# Patient Record
Sex: Male | Born: 1955
Health system: Southern US, Community
[De-identification: ages and names within clinical notes are randomized; demographics above are authoritative.]

## PROBLEM LIST (undated history)

## (undated) DIAGNOSIS — M1990 Unspecified osteoarthritis, unspecified site: Secondary | ICD-10-CM

## (undated) DIAGNOSIS — I471 Supraventricular tachycardia, unspecified: Secondary | ICD-10-CM

## (undated) DIAGNOSIS — J61 Pneumoconiosis due to asbestos and other mineral fibers: Secondary | ICD-10-CM

## (undated) DIAGNOSIS — R55 Syncope and collapse: Secondary | ICD-10-CM

## (undated) DIAGNOSIS — R05 Cough: Secondary | ICD-10-CM

## (undated) DIAGNOSIS — R1310 Dysphagia, unspecified: Secondary | ICD-10-CM

## (undated) DIAGNOSIS — H269 Unspecified cataract: Secondary | ICD-10-CM

## (undated) DIAGNOSIS — N200 Calculus of kidney: Secondary | ICD-10-CM

## (undated) DIAGNOSIS — M25569 Pain in unspecified knee: Secondary | ICD-10-CM

## (undated) DIAGNOSIS — I10 Essential (primary) hypertension: Secondary | ICD-10-CM

## (undated) DIAGNOSIS — Z86718 Personal history of other venous thrombosis and embolism: Secondary | ICD-10-CM

## (undated) HISTORY — PX: RETINAL DETACHMENT SURGERY: SHX105

## (undated) HISTORY — PX: VARICOSE VEIN SURGERY: SHX832

## (undated) HISTORY — PX: EYE SURGERY: SHX253

## (undated) HISTORY — PX: NECK SURGERY: SHX720

## (undated) HISTORY — DX: Essential (primary) hypertension: I10

## (undated) HISTORY — PX: COLONOSCOPY: SHX174

## (undated) HISTORY — PX: WISDOM TOOTH EXTRACTION: SHX21

## (undated) HISTORY — PX: HERNIA REPAIR: SHX51

## (undated) HISTORY — DX: Pain in unspecified knee: M25.569

## (undated) HISTORY — PX: UPPER GASTROINTESTINAL ENDOSCOPY: SHX188

## (undated) HISTORY — DX: Unspecified cataract: H26.9

## (undated) HISTORY — PX: CATARACT EXTRACTION: SUR2

---

## 1999-02-02 ENCOUNTER — Ambulatory Visit (HOSPITAL_COMMUNITY): Admission: RE | Admit: 1999-02-02 | Discharge: 1999-02-02 | Payer: Self-pay | Admitting: Internal Medicine

## 2003-02-07 ENCOUNTER — Encounter: Payer: Self-pay | Admitting: Unknown Physician Specialty

## 2003-02-07 ENCOUNTER — Ambulatory Visit (HOSPITAL_COMMUNITY): Admission: RE | Admit: 2003-02-07 | Discharge: 2003-02-07 | Payer: Self-pay | Admitting: Unknown Physician Specialty

## 2003-02-10 ENCOUNTER — Ambulatory Visit (HOSPITAL_COMMUNITY): Admission: RE | Admit: 2003-02-10 | Discharge: 2003-02-10 | Payer: Self-pay | Admitting: Unknown Physician Specialty

## 2003-02-10 ENCOUNTER — Encounter: Payer: Self-pay | Admitting: Unknown Physician Specialty

## 2003-11-17 ENCOUNTER — Ambulatory Visit (HOSPITAL_COMMUNITY): Admission: RE | Admit: 2003-11-17 | Discharge: 2003-11-17 | Payer: Self-pay | Admitting: Unknown Physician Specialty

## 2003-12-21 ENCOUNTER — Ambulatory Visit (HOSPITAL_COMMUNITY): Admission: RE | Admit: 2003-12-21 | Discharge: 2003-12-21 | Payer: Self-pay | Admitting: Unknown Physician Specialty

## 2004-01-02 ENCOUNTER — Encounter: Admission: RE | Admit: 2004-01-02 | Discharge: 2004-02-02 | Payer: Self-pay | Admitting: Unknown Physician Specialty

## 2008-06-22 ENCOUNTER — Ambulatory Visit: Payer: Self-pay | Admitting: Cardiology

## 2008-06-22 ENCOUNTER — Observation Stay (HOSPITAL_COMMUNITY): Admission: EM | Admit: 2008-06-22 | Discharge: 2008-06-23 | Payer: Self-pay | Admitting: Emergency Medicine

## 2008-06-23 ENCOUNTER — Ambulatory Visit: Payer: Self-pay

## 2008-07-12 ENCOUNTER — Ambulatory Visit: Payer: Self-pay | Admitting: Cardiology

## 2008-09-23 HISTORY — PX: WRIST FRACTURE SURGERY: SHX121

## 2009-03-14 ENCOUNTER — Emergency Department (HOSPITAL_COMMUNITY): Admission: EM | Admit: 2009-03-14 | Discharge: 2009-03-14 | Payer: Self-pay | Admitting: Emergency Medicine

## 2010-07-20 ENCOUNTER — Encounter (INDEPENDENT_AMBULATORY_CARE_PROVIDER_SITE_OTHER): Payer: Self-pay | Admitting: *Deleted

## 2010-08-15 ENCOUNTER — Encounter (INDEPENDENT_AMBULATORY_CARE_PROVIDER_SITE_OTHER): Payer: Self-pay | Admitting: *Deleted

## 2010-08-20 ENCOUNTER — Ambulatory Visit: Payer: Self-pay | Admitting: Internal Medicine

## 2010-09-06 ENCOUNTER — Ambulatory Visit: Payer: Self-pay | Admitting: Internal Medicine

## 2010-09-15 ENCOUNTER — Encounter: Payer: Self-pay | Admitting: Internal Medicine

## 2010-10-23 NOTE — Miscellaneous (Signed)
Summary: previsit prep/rm  Clinical Lists Changes  Medications: Added new medication of MOVIPREP 100 GM  SOLR (PEG-KCL-NACL-NASULF-NA ASC-C) As per prep instructions. - Signed Rx of MOVIPREP 100 GM  SOLR (PEG-KCL-NACL-NASULF-NA ASC-C) As per prep instructions.;  #1 x 0;  Signed;  Entered by: Sherren Kerns RN;  Authorized by: Iva Boop MD, [facility];  Method used: [facility] [phone]*, [address] [place], [place], [place]  [address], Ph: [phone] or [phone], Fax: [phone] Allergies: Added new allergy or adverse reaction of SULFA Observations: Added new observation of ALLERGY REV: Done (08/20/2010 13:27) Added new observation of NKA: F (08/20/2010 13:27)    Prescriptions: MOVIPREP 100 GM  SOLR (PEG-KCL-NACL-NASULF-NA ASC-C) As per prep instructions.  #1 x 0   Entered by:   Sherren Kerns RN   Authorized by:   Iva Boop MD, [facility]   Signed by:   Sherren Kerns RN on 08/20/2010   Method used:   [facility]ronically to        [facility] [phone]* (retail)       [address]       [place], [place]  [address]       Ph: [phone] or [phone]       Fax: [phone]   RxID:   [id number]

## 2010-10-23 NOTE — Letter (Signed)
Summary: [facility] Instructions  [facility]  [address] [place], [place] [address]   Phone: [phone]  Fax: [phone]       Jeffrey [name]    05-11-1956    MRN: [id number]        Procedure Day /Date: 09/06/10   Thursday     Arrival Time:  1:00pm      Procedure Time:  2:00pm     Location of Procedure:                    _x _  [facility] (4th Floor)                        PREPARATION FOR COLONOSCOPY WITH MOVIPREP   Starting 5 days prior to your procedure _112/10/11 _ do not eat nuts, seeds, popcorn, corn, beans, peas,  salads, or any raw vegetables.  Do not take any fiber supplements (e.g. Metamucil, Citrucel, and Benefiber).  THE DAY BEFORE YOUR PROCEDURE         DATE:   09/05/10  DAY:  Wednesday  1.  Drink clear liquids the entire day-NO SOLID FOOD  2.  Do not drink anything colored red or purple.  Avoid juices with pulp.  No orange juice.  3.  Drink at least 64 oz. (8 glasses) of fluid/clear liquids during the day to prevent dehydration and help the prep work efficiently.  CLEAR LIQUIDS INCLUDE: Water Jello Ice Popsicles Tea (sugar ok, no milk/cream) Powdered fruit flavored drinks Coffee (sugar ok, no milk/cream) Gatorade Juice: apple, white grape, white cranberry  Lemonade Clear bullion, consomm, broth Carbonated beverages (any kind) Strained chicken noodle soup Hard Candy                             4.  In the morning, mix first dose of MoviPrep solution:    Empty 1 Pouch A and 1 Pouch B into the disposable container    Add lukewarm drinking water to the top line of the container. Mix to dissolve    Refrigerate (mixed solution should be used within 24 hrs)  5.  Begin drinking the prep at 5:00 p.m. The MoviPrep container is divided by 4 marks.   Every 15 minutes drink the solution down to the next mark (approximately 8 oz) until the full liter is complete.   6.  Follow completed prep with 16 oz of clear liquid of your  choice (Nothing red or purple).  Continue to drink clear liquids until bedtime.  7.  Before going to bed, mix second dose of MoviPrep solution:    Empty 1 Pouch A and 1 Pouch B into the disposable container    Add lukewarm drinking water to the top line of the container. Mix to dissolve    Refrigerate  THE DAY OF YOUR PROCEDURE      DATE:   09/06/10  DAY:   Thursday  Beginning at  9:00 a.m. (5 hours before procedure):         1. Every 15 minutes, drink the solution down to the next mark (approx 8 oz) until the full liter is complete.  2. Follow completed prep with 16 oz. of clear liquid of your choice.    3. You may drink clear liquids until _ 12 noon _ (2 HOURS BEFORE PROCEDURE).   MEDICATION INSTRUCTIONS  Unless otherwise instructed, you should take regular prescription medications  with a small sip of water   as early as possible the morning of your procedure.           OTHER INSTRUCTIONS  You will need a responsible adult at least 55 years of age to accompany you and drive you home.   This person must remain in the waiting room during your procedure.  Wear loose fitting clothing that is easily removed.  Leave jewelry and other valuables at home.  However, you may wish to bring a book to read or  an iPod/MP3 player to listen to music as you wait for your procedure to start.  Remove all body piercing jewelry and leave at home.  Total time from sign-in until discharge is approximately 2-3 hours.  You should go home directly after your procedure and rest.  You can resume normal activities the  day after your procedure.  The day of your procedure you should not:   Drive   Make legal decisions   Operate machinery   Drink alcohol   Return to work  You will receive specific instructions about eating, activities and medications before you leave.    The above instructions have been reviewed and explained to me by   Sherren Kerns RN  August 20, 2010 2:13  PM  I fully understand and can verbalize these instructions _____________________________ Date _________

## 2010-10-23 NOTE — Letter (Signed)
Summary: Pre Visit Letter Revised  [facility]  [address] [place], [place] [address]   Phone: [phone]  Fax: [phone]        07/20/2010 MRN: [id number] Jeffrey [name] [address] [place], [place]  [address]             Procedure Date:  08/30/2010   Welcome to the Gastroenterology Division at [facility].    You are scheduled to see a nurse for your pre-procedure visit on 08/15/2010 at 8:00AM on the 3rd floor at [facility], [address]  We ask that you try to arrive at our office 15 minutes prior to your appointment time to allow for check-in.  Please take a minute to review the attached form.  If you answer "Yes" to one or more of the questions on the first page, we ask that you call the person listed at your earliest opportunity.  If you answer "No" to all of the questions, please complete the rest of the form and bring it to your appointment.    Your nurse visit will consist of discussing your medical and surgical history, your immediate family medical history, and your medications.   If you are unable to list all of your medications on the form, please bring the medication bottles to your appointment and we will list them.  We will need to be aware of both prescribed and over the counter drugs.  We will need to know exact dosage information as well.    Please be prepared to read and sign documents such as consent forms, a financial agreement, and acknowledgement forms.  If necessary, and with your consent, a friend or relative is welcome to sit-in on the nurse visit with you.  Please bring your insurance card so that we may make a copy of it.  If your insurance requires a referral to see a specialist, please bring your referral form from your primary care physician.  No co-pay is required for this nurse visit.     If you cannot keep your appointment, please call [phone] to cancel or reschedule prior to your appointment date.  This allows  [place] the opportunity to schedule an appointment for another patient in need of care.    Thank you for choosing [facility] for your medical needs.  We appreciate the opportunity to care for you.  Please visit [place] at our website  to learn more about our practice.  Sincerely, The Gastroenterology Division

## 2010-10-25 NOTE — Letter (Signed)
Summary: Patient Notice- Polyp Results  [facility]  [address] [place], [place] [address]   Phone: [phone]  Fax: [phone]        September 15, 2010 MRN: [id number]    ROCKEY GUARINO [address] [place], [place]  [address]    Dear Mr. Normington,  The polyp removed from your colon was adenomatous. This means that it was pre-cancerous or that  it had the potential to change into cancer over time.  I recommend that you have a repeat colonoscopy in 5 years to determine if you have developed any new polyps over time and screen for colorectal cancer. If you develop any new rectal bleeding, abdominal pain or significant bowel habit changes, please contact us before then.  Please call us if you are having persistent problems or have questions about your condition that have not been fully answered at this time.   Sincerely,  Iva Boop MD, [facility]  This letter has been electronically signed by your physician.  Appended Document: Patient Notice- Polyp Results Letter mailed

## 2010-10-25 NOTE — Procedures (Signed)
Summary: Colonoscopy  Patient: Jeffrey [name] Note: All result statuses are Final unless otherwise noted.  Tests: (1) Colonoscopy (COL)   COL Colonoscopy           DONE     [facility]     [address]     [place], [place]  [address]           COLONOSCOPY PROCEDURE REPORT           PATIENT:  Jeffrey [name], Jeffrey [name]  MR#:  [id number]     BIRTHDATE:  1956/09/14, 54 yrs. old  GENDER:  male     ENDOSCOPIST:  Iva Boop, MD, [facility]     REF. BY:  Rudi Heap, M.D.     PROCEDURE DATE:  09/06/2010     PROCEDURE:  Colonoscopy with snare polypectomy     ASA CLASS:  Class I     INDICATIONS:  Routine Risk Screening     MEDICATIONS:   Fentanyl 75 mcg IV, Versed 7 mg IV           DESCRIPTION OF PROCEDURE:   After the risks benefits and     alternatives of the procedure were thoroughly explained, informed     consent was obtained.  Digital rectal exam was performed and     revealed no abnormalities and normal prostate.   The LB 180AL     [id number] endoscope was introduced through the anus and advanced to     the cecum, which was identified by both the appendix and ileocecal     valve, without limitations.  The quality of the prep was     excellent, using MoviPrep.  The instrument was then slowly     withdrawn as the colon was fully examined. Insertion: 2:18 minutes     Withdrawal: 10:07 minutes     <<PROCEDUREIMAGES>>           FINDINGS:  A diminutive polyp was found in the ascending colon. It     was 4 mm in size. Polyp was snared without cautery. Retrieval was     successful. snare polyp  This was otherwise a normal examination     of the colon.   Retroflexed views in the right colon and  rectum     revealed no abnormalities.    The scope was then withdrawn from     the patient and the procedure completed.           COMPLICATIONS:  None     ENDOSCOPIC IMPRESSION:     1) 4 mm diminutive polyp in the ascending colon - removed     2) Otherwise normal examination with excellent prep          REPEAT EXAM:  In for Colonoscopy, pending biopsy results.           Iva Boop, MD, [facility]           CC:  Rudi Heap, MD and The Patient           n.     eSIGNED:   Iva Boop at 09/06/2010 02:05 PM           Cecil Cranker, [id number]  Note: An exclamation mark (!) indicates a result that was not dispersed into the flowsheet. Document Creation Date: 09/06/2010 2:06 PM _______________________________________________________________________  (1) Order result status: Final Collection or observation date-time: 09/06/2010 13:58 Requested date-time:  Receipt date-time:  Reported date-time:  Referring Physician:   Ordering Physician: Stan Head (  [id number]) Specimen Source:  Source: Launa Grill Order Number: [id number] Lab site:   Appended Document: Colonoscopy      Colonoscopy  Procedure date:  09/06/2010  Findings:          1) 4 mm diminutive polyp in the ascending colon - removed ADENOMA     2) Otherwise normal examination with excellent prep  Comments:      Repeat colonoscopy in 5 years.   Procedures Next Due Date:    Colonoscopy: 08/2015   Appended Document: Colonoscopy     Procedures Next Due Date:    Colonoscopy: 08/2015

## 2011-02-05 NOTE — Assessment & Plan Note (Signed)
[facility]                            CARDIOLOGY OFFICE NOTE   NAME:[name], Jeffrey [name]                         MRN:          [id number]  DATE:07/12/2008                            DOB:          1955/12/16    Jeffrey [name] returns today for followup.  He was admitted on June 22, 2008, with chest pain.   He ruled out for myocardial infarction.  His lipid panel showed a mixed  hyperlipidemia.  His hemoglobin A1c was 5.6.   He was discharged on Lopressor 25 b.i.d., Lipitor 20 mg q.h.s., and  enteric-coated aspirin 81 mg a day.  He is not taking his Lipitor or his  Lopressor.  He wants to try to lose weight and do this on his own.   He had a stress Myoview as an outpatient here at the office on June 23, 2008.  This showed excellent exercise tolerance.  No ST-segment  changes and normal perfusion.  His EF was 65%.   His exam today is unchanged from his admission H&P.   Jeffrey [name] probably has nonobstructive coronary artery disease.  He has  several cardiac risk factors.  Both his blood pressure and  hyperlipidemia need to be addressed.  He would like to try this by  losing weight and with changing his diet.  I have told him I doubt this  is going to be successful.  If it is not, we will be happy to see him  back and represcribe medications for both.  He can also follow with Dr.  Rudi Heap and his group at [facility].  Otherwise, we will see him back p.r.n.   I have filled out a form for [employer] for him to return to work.     Thomas C. Daleen Squibb, MD, [facility]  Electronically Signed    TCW/MedQ  DD: 07/12/2008  DT: 07/13/2008  Job #: [id number]   cc:   Ernestina Penna, M.D.

## 2011-02-05 NOTE — H&P (Signed)
NAMEANDRAY, Jeffrey [name]                  ACCOUNT NO.:  [id number]   MEDICAL RECORD NO.:  [id number]          PATIENT TYPE:  INP   LOCATION:  4709                         FACILITY:  [facility]   PHYSICIAN:  Jesse Sans. Wall, MD, FACCDATE OF BIRTH:  [birth year]   DATE OF ADMISSION:  06/22/2008  DATE OF DISCHARGE:                              HISTORY & PHYSICAL   PRIMARY CARE PHYSICIAN:  Ernestina Penna, MD in [place].   CARDIOLOGIST:  Jesse Sans. Wall, MD, [facility]   SUMMERY OF HISTORY:  Mr. Valadez is a 55 year old white male who presents  to [facility] Emergency Room complaining of chest discomfort.  He  describes at least a 3-to 4-week history of anterior dull chest  discomfort, that is not radiated, would last seconds at a time and occur  3-4 times a day.  This would not be associated with shortness of breath,  nausea, vomiting, or diaphoresis.  This discomfort was usually a 3 on a  scale of 0-10 and would occur any time, however, he would not seem to  notice while playing volleyball.  Over the preceding week, he has  noticed increased frequency to the point that it is occurring every 5  minutes and lasting a few seconds.  He might have several 100 episodes a  day.  Occasionally, it would now radiate to the left shoulder or left  arm.  Again, he gives at a 3 on a scale of 0-10.  He has not had any  other associated symptoms.  He denies recent accidents, injuries, or  travel.  It is not pleuritic, does not change with twisting, turning, or  movement and denies prior occurrences.  He specifically states with  playing volleyball in the last couple weeks, he has not noticed the  discomfort nor has he had to stop and rest for any reason.   PAST MEDICAL HISTORY:  No known drug allergies.   MEDICATIONS:  Prior to admission include over-the-counter Claritin daily  since Friday and p.r.n. Tylenol.   He has a history of seasonal allergies and asbestosis that was diagnosed  through a work program, although he  has not had any complications from  this.  He also has a possible history of hyperlipidemia, but has not  checked in several years.  He has had C5 through C7 disk surgery,  bilateral inguinal hernia repair, left thumb surgery, multiple rib  fractures in L4, for these he did not seek medical treatment for.  He  specifically denies myocardial infarction, CVA, diabetes, hypertension,  bleeding dyscrasias, thyroid dysfunction, and renal disorder.   He resides in [place] with his wife.  He has 2 children.  No  grandchildren.  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  He has never smoked.  He might have 2 beers per month.  Denies  drug use.  Denies specific exercise, diet, or herbal medications.   FAMILY HISTORY:  Mother is alive at age 68 with diverticulitis and  history of gallbladder problems for which she had surgery.  Father is 63  with history of  hypertension and stroke.  He has 2 brothers and 1  sister, alive and well.   REVIEW OF SYSTEMS:  In addition to the above, is notable for occasional  headaches, sinus congestion contacts, positive snoring, has not been  evaluated for sleep apnea, nocturia.  All other systems are  unremarkable.   PHYSICAL EXAMINATION:  GENERAL:  Well-nourished, well-developed,  pleasant white male in no apparent distress.  VITAL SIGNS:  Temperature is 97.6, blood pressure 137/98, pulse 81, and  respirations 20, and 97% sats on room air.  HEENT:  Unremarkable.  NECK:  Supple without thyromegaly, adenopathy, JVD, or carotid bruits.  CHEST:  Symmetrical to excursion.  Lung sounds are clear to  auscultation.  HEART:  PMI is nondisplaced.  Regular rate and rhythm.  I do not  appreciate any murmurs, rubs, clicks, or gallops.  All pulses are  symmetrical and intact without bruits.  SKIN:  Integument is intact.  ABDOMEN:  Slightly rounded.  Bowel sounds present without organomegaly,  masses, or tenderness.  EXTREMITIES:  Negative for  cyanosis, clubbing, or edema.  MUSCULOSKELETAL:  Unremarkable.  It is noted that he does not have any  reproducible discomfort with palpation or with full range of motion on  his upper extremities.  NEURO:  Unremarkable.   Chest X-ray:  No active disease.  EKG showed normal sinus rhythm, normal  axis, nonspecific ST-T wave changes.  No old EKGs available for  comparison.  H&H is 15.3 and 44.5, normal indices, platelets 205, and  WBC is 6.2.  Sodium 139, potassium 4.9, BUN 14, creatinine 1.05, and  glucose 82.  Point-of-care marker negative x1.   IMPRESSION:  1. Atypical chest discomfort.  Given his history, negative point-of-      care and EKGs.  This does not seem to be a cardiac origin.  2. Hypertension.  History is noted per past medical history.   DISPOSITION:  Dr. Daleen Squibb reviewed the patient's history, spoke with and  examined the patient.  He also spend time speaking with his wife, Jeffrey [name],  who is a Engineer, civil (consulting) here at [facility].  We will admit him overnight  for observation.  If he is ruled out for myocardial infarction, we have  arranged an outpatient stress Myoview in our office at 12 o'clock.  He  also will have a followup appointment with Dr. Daleen Squibb on July 12, 2008,  at 1:45 p.m.  We will perform our usual labs as well as check a fasting  lipid.      Joellyn Rued, PA-C      Jesse Sans. Daleen Squibb, MD, [facility]  Electronically Signed    EW/MEDQ  D:  06/22/2008  T:  06/23/2008  Job:  [id number]   cc:   Ernestina Penna, M.D.  Thomas C. Wall, MD, [facility]

## 2011-02-05 NOTE — Discharge Summary (Signed)
NAMEZACKORY, Jeffrey [name]                  ACCOUNT NO.:  [id number]   MEDICAL RECORD NO.:  [id number]          PATIENT TYPE:  INP   LOCATION:  4709                         FACILITY:  [facility]   PHYSICIAN:  Jeffrey Sans. Wall, MD, FACCDATE OF BIRTH:  [birth year]   DATE OF ADMISSION:  06/22/2008  DATE OF DISCHARGE:  06/23/2008                               DISCHARGE SUMMARY   PRIMARY CARDIOLOGIST:  Jeffrey Fus C. Wall, MD, [facility]   PRIMARY CARE PHYSICIAN:  Dr. Christell Constant in [place].   FINAL DISCHARGE DIAGNOSES:  1. Atypical chest pain.  2. Hypertension.  3. History of asbestosis exposure.  4. Hyperlipidemia.   PROCEDURES PERFORMED DURING HOSPITALIZATION:  None.   HOSPITAL COURSE:  This is a 55 year old male patient with complaints of  3- to 4-week history of anterior chest dull pain without radiation  lasting seconds at a time, 3-4 times a day with no associated symptoms.  The patient has noticed increased frequency every 5 minutes with  occasional radiation to the left shoulder and arm.  The patient had no  associated shortness of breath, PND, or orthopnea.  As a result of this,  the patient came into the emergency room for evaluation.  The patient's  cardiac enzymes were cycled and found to be negative.  EKG revealed  normal sinus rhythm.  It was noted that the patient did have elevated  LDL of 135 and total cholesterol of 192.  The patient had no recurrence  of chest discomfort.  During hospitalization, he was placed on Lipitor  20 mg and aspirin daily.  The patient was seen and examined the  following day by Dr. Valera Castle and found to be stable for discharge.  The patient will return home on Lopressor 25 mg twice a day and Lipitor  40 mg nightly.  However, the Lopressor will be held on day of discharge  as he has been scheduled for an outpatient stress Myoview at the [facility]  Cardiology Office at 12 noon with a followup with Dr. Daleen [name] thereafter.   DISCHARGE LABORATORIES:  Sodium 139, potassium 4.9,  chloride 108, CO2 of  26, BUN 14, creatinine 1.05, and glucose 82.  Hemoglobin 15.3,  hematocrit 44.5, white blood cells 6.2, and platelets 207.  Cardiac  enzymes are found to be negative.  TSH normal.  Hemoglobin A1c 5.6.  Total cholesterol 192, LDL 135, and HDL 32.   DISCHARGE VITAL SIGNS:  Blood pressure 120/75, heart rate 62,  respirations 20, and O2 sat 96% on room air.   EKG revealing normal sinus rhythm.   DISCHARGE MEDICATIONS:  1. Aspirin 81 mg daily.  2. Lipitor 20 mg at bedtime.  3. Lopressor 25 mg b.i.d.   ALLERGIES:  No known drug allergies.   FOLLOWUP PLANS AND APPOINTMENT:  1. The patient is scheduled for stress Myoview in our office today,      June 23, 2008.  2. The patient will follow up with Dr. Valera Castle on July 12, 2008, at 1:25 p.m.  3. The patient will follow up with his primary care physician for  continued medical management.  4. The patient has been provided prescriptions for Lipitor and      Lopressor.   Time spent with the patient to include physician time 35 minutes.      Bettey Mare. Lyman Bishop, NP      Jeffrey Sans. Daleen Squibb, MD, [facility]  Electronically Signed    KML/MEDQ  D:  06/23/2008  T:  06/23/2008  Job:  [id number]   cc:   Ernestina Penna, M.D.

## 2011-06-24 LAB — DIFFERENTIAL
Basophils Absolute: 0.1
Basophils Relative: 1
Eosinophils Relative: 3
Lymphocytes Relative: 26

## 2011-06-24 LAB — BASIC METABOLIC PANEL
BUN: 14
Calcium: 9.2
GFR calc non Af Amer: >60
Glucose, Bld: 82
Potassium: 4.9

## 2011-06-24 LAB — CBC
HCT: 44.5
MCHC: 34.5
Platelets: 205
RDW: 12.7

## 2011-06-24 LAB — LIPID PANEL
Cholesterol: 192
LDL Cholesterol: 135 — ABNORMAL HIGH
Triglycerides: 123

## 2011-06-24 LAB — POCT CARDIAC MARKERS: Myoglobin, poc: 87.9

## 2011-06-24 LAB — PROTIME-INR
INR: 1
INR: 1
Prothrombin Time: 13.6

## 2011-06-24 LAB — HEMOGLOBIN A1C: Mean Plasma Glucose: 114

## 2011-06-24 LAB — APTT
aPTT: 29
aPTT: 30

## 2011-06-24 LAB — CK TOTAL AND CKMB (NOT AT ARMC): Relative Index: INVALID

## 2011-06-24 LAB — TROPONIN I: Troponin I: 0.01

## 2011-06-25 LAB — COMPREHENSIVE METABOLIC PANEL
AST: 22
Albumin: 3.4 — ABNORMAL LOW
Alkaline Phosphatase: 57
Chloride: 108
GFR calc Af Amer: >60
Potassium: 4.1
Total Bilirubin: 0.7
Total Protein: 5.7 — ABNORMAL LOW

## 2011-06-25 LAB — CARDIAC PANEL(CRET KIN+CKTOT+MB+TROPI)
CK, MB: 1.5
Total CK: 76

## 2011-10-02 ENCOUNTER — Emergency Department (HOSPITAL_COMMUNITY): Payer: 59

## 2011-10-02 ENCOUNTER — Emergency Department (HOSPITAL_COMMUNITY)
Admission: EM | Admit: 2011-10-02 | Discharge: 2011-10-02 | Disposition: A | Discharge status: Home / Self Care | Payer: 59 | Attending: Emergency Medicine | Admitting: Emergency Medicine

## 2011-10-02 ENCOUNTER — Encounter (HOSPITAL_COMMUNITY): Payer: Self-pay | Admitting: Emergency Medicine

## 2011-10-02 DIAGNOSIS — R109 Unspecified abdominal pain: Secondary | ICD-10-CM | POA: Insufficient documentation

## 2011-10-02 DIAGNOSIS — K869 Disease of pancreas, unspecified: Secondary | ICD-10-CM | POA: Insufficient documentation

## 2011-10-02 DIAGNOSIS — R110 Nausea: Secondary | ICD-10-CM | POA: Insufficient documentation

## 2011-10-02 DIAGNOSIS — N200 Calculus of kidney: Secondary | ICD-10-CM | POA: Insufficient documentation

## 2011-10-02 LAB — POCT I-STAT, CHEM 8
Hemoglobin: 13.6 g/dL (ref 13.0–17.0)
Sodium: 142 mEq/L (ref 135–145)
TCO2: 24 mmol/L (ref 0–100)

## 2011-10-02 LAB — URINALYSIS, ROUTINE W REFLEX MICROSCOPIC
Bilirubin Urine: NEGATIVE
Glucose, UA: NEGATIVE mg/dL
pH: 5.5 (ref 5.0–8.0)

## 2011-10-02 LAB — URINE CULTURE
Colony Count: NO GROWTH
Culture  Setup Time: 201301100118

## 2011-10-02 LAB — URINE MICROSCOPIC-ADD ON

## 2011-10-02 MED ORDER — PERCOCET 5-325 MG PO TABS: 1.0000 | ORAL_TABLET | Freq: Four times a day (QID) | ORAL | Status: AC | PRN

## 2011-10-02 NOTE — ED Provider Notes (Signed)
History     CSN: [id number]  Arrival date & time 10/02/11  1125   First MD Initiated Contact with Patient 10/02/11 1209      Chief Complaint  Patient presents with  . Flank Pain    (Consider location/radiation/quality/duration/timing/severity/associated sxs/prior treatment) HPI Comments: Patient presented to the ED with chief complaint of severe right flank pain associated with nausea.  Patient denies history of back pain or kidney stones.  Patient is a 56 y.o. male presenting with flank pain. The history is provided by the patient.  Flank Pain This is a new problem. The current episode started today. The problem occurs constantly. The problem has been resolved (with pain medication ). Associated symptoms include nausea. Pertinent negatives include no abdominal pain, anorexia, arthralgias, change in bowel habit, chest pain, chills, congestion, coughing, diaphoresis, fatigue, fever, headaches, joint swelling, myalgias, neck pain, numbness, rash, sore throat, swollen glands, urinary symptoms, vertigo, visual change, vomiting or weakness. Exacerbated by: any movement or vibration. Treatments tried: morphine via EMS, 8 IV. The treatment provided significant relief.    History reviewed. No pertinent past medical history.  History reviewed. No pertinent past surgical history.  No family history on file.  History  Substance Use Topics  . Smoking status: Never Smoker   . Smokeless tobacco: Not on file  . Alcohol Use: No      Review of Systems  Constitutional: Negative for fever, chills, diaphoresis and fatigue.  HENT: Negative for ear pain, congestion, sore throat, neck pain and sinus pressure.   Eyes: Negative for visual disturbance.  Respiratory: Negative for cough.   Cardiovascular: Negative for chest pain.  Gastrointestinal: Positive for nausea. Negative for vomiting, abdominal pain, anorexia and change in bowel habit.  Genitourinary: Positive for flank pain. Negative for  dysuria, urgency and difficulty urinating.  Musculoskeletal: Negative for myalgias, joint swelling and arthralgias.  Skin: Negative for color change and rash.  Neurological: Negative for dizziness, vertigo, weakness, numbness and headaches.  Psychiatric/Behavioral: Negative for confusion.  All other systems reviewed and are negative.    Allergies  Sulfonamide derivatives  Home Medications   Current Outpatient Rx  Name Route Sig Dispense Refill  . VITAMIN C 100 MG PO TABS Oral Take 100 mg by mouth daily.    . ASPIRIN 325 MG PO TBEC Oral Take 325 mg by mouth daily.    . IBUPROFEN 200 MG PO TABS Oral Take 200 mg by mouth every 6 (six) hours as needed. pain      BP 126/79  Pulse 73  Temp(Src) 98.3 F (36.8 C) (Oral)  Resp 18  SpO2 98%  Physical Exam  Nursing note and vitals reviewed. Constitutional: He is oriented to person, place, and time. He appears well-developed and well-nourished. No distress.  HENT:  Head: Normocephalic and atraumatic.  Eyes: Conjunctivae and EOM are normal.  Neck: Normal range of motion. Neck supple.  Cardiovascular: Normal rate, regular rhythm and normal heart sounds.   Pulmonary/Chest: Effort normal.  Abdominal: Soft. Bowel sounds are normal. He exhibits no distension, no ascites and no pulsatile midline mass. There is tenderness. There is CVA tenderness.  Musculoskeletal: Normal range of motion.       Thoracic back: He exhibits no tenderness and no bony tenderness.       Lumbar back: He exhibits no tenderness and no bony tenderness.       Full ROM of lower extremities. No hip instability. Distal pulses intact   Neurological: He is alert and oriented to person, place,  and time.  Skin: Skin is warm and dry. He is not diaphoretic.  Psychiatric: He has a normal mood and affect. His behavior is normal.    ED Course  Procedures (including critical care time)  Labs Reviewed  URINALYSIS, ROUTINE W REFLEX MICROSCOPIC - Abnormal; Notable for the  following:    Hgb urine dipstick MODERATE (*)    Ketones, ur TRACE (*)    All other components within normal limits  URINE MICROSCOPIC-ADD ON - Abnormal; Notable for the following:    Bacteria, UA FEW (*)    All other components within normal limits  POCT I-STAT, CHEM 8  I-STAT, CHEM 8  URINE CULTURE   Ct Abdomen Pelvis Wo Contrast  10/02/2011  *RADIOLOGY REPORT*  Clinical Data: Right flank pain  CT ABDOMEN AND PELVIS WITHOUT CONTRAST  Technique:  Multidetector CT imaging of the abdomen and pelvis was performed following the standard protocol without intravenous contrast.  Comparison: None.  Findings: No focal abnormalities seen in the liver or spleen on this study performed without intravenous contrast material.  The stomach, duodenum, gallbladder, and adrenal glands are normal.  2.0 x 2.0 cm subtle hypoattenuating lesion is identified in the tail the pancreas.  No retroperitoneal lymphadenopathy.  1 mm nonobstructing stone is seen in the interpolar left kidney. No stones are seen in the right kidney.  There may be some subtle edema around the right kidney and proximal right ureter.  No stones are seen in either ureter.  There is a 1-2 mm stone in the dependent posterior bladder lumen.  Imaging through the pelvis shows no free fluid or lymphadenopathy. There is no pelvic sidewall lymphadenopathy.  Diverticular changes seen in the sigmoid colon without diverticulitis.  Terminal ileum is normal.  The appendix is normal.  Bone windows reveal no worrisome lytic or sclerotic osseous lesions.  IMPRESSION: 1 mm stone is identified and free in the dependent portion of the urinary bladder.  There are some subtle secondary changes around the right kidney which suggest a right renal origin.  There is also a tiny nonobstructing stone seen in the interpolar region of the left kidney.  2 cm hypoattenuating lesion in the tail of the pancreas. Pancreatic neoplasm could have this appearance.  Dedicated abdominal MRI  without and with contrast is recommended to further evaluate.  I discussed these findings by telephone with Jaci Carrel, in the emergency room, at approximately 1345 hours on 10/02/2011.  Original Report Authenticated By: ERIC A. MANSELL, M.D.     No diagnosis found.    MDM  Kidney stone  Pts pain has been managed and stone is currently in bladder. Pt will be dc with filter and pain medication.  Discussed results a CT with patient and family including the incidental finding of a 2 cm lesion in the pancreatic tail.  Patient verbalizes that he understands the importance of following up with an MRI as an outpatient and will talk to his primary care doctor about setting this up.        Jaci Carrel, [place] 10/02/11 1421

## 2011-10-02 NOTE — ED Notes (Signed)
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

## 2011-10-02 NOTE — ED Notes (Signed)
[id number]<AV> Expected date:10/02/11<BR> Expected time:11:04 AM<BR> Means of arrival:Ambulance<BR> Comments:<BR> Flank pain

## 2011-10-02 NOTE — ED Notes (Signed)
Patient received a total of 8mg  morphine IVP prior to arrival.

## 2011-10-02 NOTE — ED Notes (Signed)
Labs drawn, labeled at bedside

## 2011-10-03 NOTE — ED Provider Notes (Signed)
Medical screening examination/treatment/procedure(s) were performed by non-physician practitioner and as supervising physician I was immediately available for consultation/collaboration.   Tasmia Blumer M Heiress Williamson, DO 10/03/11 1155 

## 2011-10-04 ENCOUNTER — Other Ambulatory Visit: Payer: Self-pay | Admitting: Family Medicine

## 2011-10-04 DIAGNOSIS — K869 Disease of pancreas, unspecified: Secondary | ICD-10-CM

## 2011-10-06 ENCOUNTER — Other Ambulatory Visit: Payer: Self-pay | Admitting: Family Medicine

## 2011-10-06 ENCOUNTER — Ambulatory Visit (HOSPITAL_COMMUNITY)
Admission: RE | Admit: 2011-10-06 | Discharge: 2011-10-06 | Disposition: A | Discharge status: Home / Self Care | Payer: 59 | Source: Ambulatory Visit | Attending: Family Medicine | Admitting: Family Medicine

## 2011-10-06 DIAGNOSIS — R935 Abnormal findings on diagnostic imaging of other abdominal regions, including retroperitoneum: Secondary | ICD-10-CM | POA: Insufficient documentation

## 2011-10-06 DIAGNOSIS — K869 Disease of pancreas, unspecified: Secondary | ICD-10-CM

## 2011-10-06 DIAGNOSIS — Z1389 Encounter for screening for other disorder: Secondary | ICD-10-CM | POA: Insufficient documentation

## 2011-10-06 MED ORDER — GADOBENATE DIMEGLUMINE 529 MG/ML IV SOLN
20.0000 mL | Freq: Once | INTRAVENOUS | Status: AC | PRN
  Administered 2011-10-06: 20 mL via INTRAVENOUS

## 2011-10-22 ENCOUNTER — Ambulatory Visit (INDEPENDENT_AMBULATORY_CARE_PROVIDER_SITE_OTHER): Payer: 59 | Admitting: General Surgery

## 2013-01-14 ENCOUNTER — Ambulatory Visit: Payer: 59 | Admitting: Psychology

## 2013-01-18 ENCOUNTER — Ambulatory Visit (INDEPENDENT_AMBULATORY_CARE_PROVIDER_SITE_OTHER): Payer: 59 | Admitting: Licensed Clinical Social Worker

## 2013-01-18 DIAGNOSIS — F411 Generalized anxiety disorder: Secondary | ICD-10-CM

## 2013-01-18 DIAGNOSIS — F331 Major depressive disorder, recurrent, moderate: Secondary | ICD-10-CM

## 2013-01-25 ENCOUNTER — Ambulatory Visit (INDEPENDENT_AMBULATORY_CARE_PROVIDER_SITE_OTHER): Payer: 59 | Admitting: Licensed Clinical Social Worker

## 2013-01-25 DIAGNOSIS — F331 Major depressive disorder, recurrent, moderate: Secondary | ICD-10-CM

## 2013-01-25 DIAGNOSIS — F411 Generalized anxiety disorder: Secondary | ICD-10-CM

## 2013-01-26 ENCOUNTER — Ambulatory Visit: Payer: 59 | Admitting: Psychology

## 2013-02-01 ENCOUNTER — Ambulatory Visit (INDEPENDENT_AMBULATORY_CARE_PROVIDER_SITE_OTHER): Payer: 59 | Admitting: Licensed Clinical Social Worker

## 2013-02-01 DIAGNOSIS — F331 Major depressive disorder, recurrent, moderate: Secondary | ICD-10-CM

## 2013-02-01 DIAGNOSIS — F411 Generalized anxiety disorder: Secondary | ICD-10-CM

## 2013-02-08 ENCOUNTER — Ambulatory Visit (INDEPENDENT_AMBULATORY_CARE_PROVIDER_SITE_OTHER): Payer: 59 | Admitting: Licensed Clinical Social Worker

## 2013-02-08 DIAGNOSIS — F411 Generalized anxiety disorder: Secondary | ICD-10-CM

## 2013-02-08 DIAGNOSIS — F331 Major depressive disorder, recurrent, moderate: Secondary | ICD-10-CM

## 2013-02-15 ENCOUNTER — Encounter (HOSPITAL_COMMUNITY): Payer: Self-pay | Admitting: Emergency Medicine

## 2013-02-15 ENCOUNTER — Emergency Department (HOSPITAL_COMMUNITY)
Admission: EM | Admit: 2013-02-15 | Discharge: 2013-02-15 | Disposition: A | Discharge status: Home / Self Care | Payer: 59 | Attending: Emergency Medicine | Admitting: Emergency Medicine

## 2013-02-15 DIAGNOSIS — S81852A Open bite, left lower leg, initial encounter: Secondary | ICD-10-CM

## 2013-02-15 DIAGNOSIS — W540XXA Bitten by dog, initial encounter: Secondary | ICD-10-CM | POA: Insufficient documentation

## 2013-02-15 DIAGNOSIS — Y92410 Unspecified street and highway as the place of occurrence of the external cause: Secondary | ICD-10-CM | POA: Insufficient documentation

## 2013-02-15 DIAGNOSIS — Z79899 Other long term (current) drug therapy: Secondary | ICD-10-CM | POA: Insufficient documentation

## 2013-02-15 DIAGNOSIS — Z23 Encounter for immunization: Secondary | ICD-10-CM | POA: Insufficient documentation

## 2013-02-15 DIAGNOSIS — S81009A Unspecified open wound, unspecified knee, initial encounter: Secondary | ICD-10-CM | POA: Insufficient documentation

## 2013-02-15 DIAGNOSIS — Y9355 Activity, bike riding: Secondary | ICD-10-CM | POA: Insufficient documentation

## 2013-02-15 DIAGNOSIS — Z7982 Long term (current) use of aspirin: Secondary | ICD-10-CM | POA: Insufficient documentation

## 2013-02-15 MED ORDER — AMOXICILLIN-POT CLAVULANATE 875-125 MG PO TABS: 1.0000 | ORAL_TABLET | Freq: Two times a day (BID) | ORAL | Status: DC

## 2013-02-15 MED ORDER — TETANUS-DIPHTH-ACELL PERTUSSIS 5-2.5-18.5 LF-MCG/0.5 IM SUSP
0.5000 mL | Freq: Once | INTRAMUSCULAR | Status: AC
  Administered 2013-02-15: 0.5 mL via INTRAMUSCULAR
  Filled 2013-02-15: qty 0.5

## 2013-02-15 MED ORDER — RABIES VACCINE, PCEC IM SUSR
1.0000 mL | Freq: Once | INTRAMUSCULAR | Status: AC
  Administered 2013-02-15: 1 mL via INTRAMUSCULAR
  Filled 2013-02-15: qty 1

## 2013-02-15 MED ORDER — RABIES IMMUNE GLOBULIN 150 UNIT/ML IM INJ
1800.0000 [IU] | INJECTION | Freq: Once | INTRAMUSCULAR | Status: AC
  Administered 2013-02-15: 1800 [IU] via INTRAMUSCULAR
  Filled 2013-02-15: qty 12
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
## 2013-02-15 NOTE — Discharge Instructions (Signed)
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
## 2013-02-18 ENCOUNTER — Encounter (HOSPITAL_COMMUNITY): Payer: Self-pay | Admitting: Emergency Medicine

## 2013-02-18 ENCOUNTER — Emergency Department (INDEPENDENT_AMBULATORY_CARE_PROVIDER_SITE_OTHER)
Admission: EM | Admit: 2013-02-18 | Discharge: 2013-02-18 | Disposition: A | Discharge status: Home / Self Care | Payer: 59 | Source: Home / Self Care

## 2013-02-18 DIAGNOSIS — Z203 Contact with and (suspected) exposure to rabies: Secondary | ICD-10-CM

## 2013-02-18 MED ORDER — RABIES VACCINE, PCEC IM SUSR
INTRAMUSCULAR | Status: AC
  Filled 2013-02-18: qty 1

## 2013-02-18 MED ORDER — RABIES VACCINE, PCEC IM SUSR
1.0000 mL | Freq: Once | INTRAMUSCULAR | Status: AC
  Administered 2013-02-18: 1 mL via INTRAMUSCULAR

## 2013-02-18 NOTE — ED Notes (Signed)
Pt is here for 2nd rabies vaccination Reports no new problems... He is alert and oriented w/no signs of acute distress.

## 2013-02-19 ENCOUNTER — Other Ambulatory Visit: Payer: Self-pay

## 2013-02-19 MED ORDER — MONTELUKAST SODIUM 10 MG PO TABS: 10.0000 mg | ORAL_TABLET | Freq: Every day | ORAL | Status: DC

## 2013-02-22 ENCOUNTER — Ambulatory Visit: Payer: 59 | Admitting: Licensed Clinical Social Worker

## 2013-02-22 ENCOUNTER — Encounter (HOSPITAL_COMMUNITY): Payer: Self-pay | Admitting: Emergency Medicine

## 2013-02-22 ENCOUNTER — Emergency Department (HOSPITAL_COMMUNITY)
Admission: EM | Admit: 2013-02-22 | Discharge: 2013-02-22 | Disposition: A | Discharge status: Home / Self Care | Payer: 59 | Source: Home / Self Care

## 2013-02-22 DIAGNOSIS — Z203 Contact with and (suspected) exposure to rabies: Secondary | ICD-10-CM

## 2013-02-22 MED ORDER — RABIES VACCINE, PCEC IM SUSR
INTRAMUSCULAR | Status: AC
  Filled 2013-02-22: qty 1

## 2013-02-22 MED ORDER — RABIES VACCINE, PCEC IM SUSR
1.0000 mL | Freq: Once | INTRAMUSCULAR | Status: AC
  Administered 2013-02-22: 1 mL via INTRAMUSCULAR

## 2013-02-22 NOTE — ED Notes (Signed)
Pt is here for his 3rd rabies vaccination Reports no new problems... He is alert and oriented w/no signs of acute distress.

## 2013-03-01 ENCOUNTER — Ambulatory Visit (INDEPENDENT_AMBULATORY_CARE_PROVIDER_SITE_OTHER): Payer: 59 | Admitting: Licensed Clinical Social Worker

## 2013-03-01 ENCOUNTER — Emergency Department (HOSPITAL_COMMUNITY)
Admission: EM | Admit: 2013-03-01 | Discharge: 2013-03-01 | Disposition: A | Discharge status: Home / Self Care | Payer: 59 | Source: Home / Self Care

## 2013-03-01 ENCOUNTER — Encounter (HOSPITAL_COMMUNITY): Payer: Self-pay | Admitting: *Deleted

## 2013-03-01 DIAGNOSIS — F411 Generalized anxiety disorder: Secondary | ICD-10-CM

## 2013-03-01 DIAGNOSIS — Z203 Contact with and (suspected) exposure to rabies: Secondary | ICD-10-CM

## 2013-03-01 DIAGNOSIS — F331 Major depressive disorder, recurrent, moderate: Secondary | ICD-10-CM

## 2013-03-01 MED ORDER — RABIES VACCINE, PCEC IM SUSR
INTRAMUSCULAR | Status: AC
  Filled 2013-03-01: qty 1

## 2013-03-01 MED ORDER — RABIES VACCINE, PCEC IM SUSR
1.0000 mL | Freq: Once | INTRAMUSCULAR | Status: AC
  Administered 2013-03-01: 1 mL via INTRAMUSCULAR

## 2013-03-01 NOTE — ED Notes (Signed)
Pt  Here  For  The  Last of  His  Rabies   Shot   No  Symptoms  eccept  Some  Mild  Swelling  At the  Bite  Site  No  Redness  No  Pain no    Drainage      Does  Not  Elect to  See  The  Dr   After  Being  advised

## 2013-03-08 ENCOUNTER — Ambulatory Visit: Payer: 59 | Admitting: Licensed Clinical Social Worker

## 2013-03-22 ENCOUNTER — Telehealth: Payer: Self-pay | Admitting: Family Medicine

## 2013-03-22 NOTE — Telephone Encounter (Signed)
appt given on July 7 at 9:30 with bill oxford

## 2013-03-29 ENCOUNTER — Ambulatory Visit (INDEPENDENT_AMBULATORY_CARE_PROVIDER_SITE_OTHER): Payer: 59 | Admitting: Family Medicine

## 2013-03-29 ENCOUNTER — Encounter: Payer: Self-pay | Admitting: Family Medicine

## 2013-03-29 VITALS — BP 152/96 | HR 73 | Temp 97.4°F | Ht 70.0 in | Wt 205.2 lb

## 2013-03-29 DIAGNOSIS — I10 Essential (primary) hypertension: Secondary | ICD-10-CM

## 2013-03-29 DIAGNOSIS — Z01818 Encounter for other preprocedural examination: Secondary | ICD-10-CM

## 2013-03-29 DIAGNOSIS — Z0000 Encounter for general adult medical examination without abnormal findings: Secondary | ICD-10-CM

## 2013-03-29 LAB — POCT CBC
Granulocyte percent: 68 %G (ref 37–80)
HCT, POC: 44.7 % (ref 43.5–53.7)
Hemoglobin: 15.8 g/dL (ref 14.1–18.1)
Lymph, poc: 1.8 (ref 0.6–3.4)
MCH, POC: 29.7 pg (ref 27–31.2)
MCHC: 35.3 g/dL (ref 31.8–35.4)
MCV: 84 fL (ref 80–97)
MPV: 7.5 fL (ref 0–99.8)
POC Granulocyte: 4.4 (ref 2–6.9)
POC LYMPH PERCENT: 27.6 %L (ref 10–50)
Platelet Count, POC: 219 10*3/uL (ref 142–424)
RBC: 5.3 M/uL (ref 4.69–6.13)
RDW, POC: 13 %
WBC: 6.4 10*3/uL (ref 4.6–10.2)

## 2013-03-29 LAB — COMPLETE METABOLIC PANEL WITH GFR
ALT: 21 U/L (ref 0–53)
AST: 21 U/L (ref 0–37)
Albumin: 4.6 g/dL (ref 3.5–5.2)
Alkaline Phosphatase: 64 U/L (ref 39–117)
BUN: 18 mg/dL (ref 6–23)
CO2: 28 mEq/L (ref 19–32)
Calcium: 9.9 mg/dL (ref 8.4–10.5)
Chloride: 106 mEq/L (ref 96–112)
Creat: 1.16 mg/dL (ref 0.50–1.35)
GFR, Est African American: 80 mL/min
GFR, Est Non African American: 70 mL/min
Glucose, Bld: 94 mg/dL (ref 70–99)
Potassium: 5.2 mEq/L (ref 3.5–5.3)
Sodium: 140 mEq/L (ref 135–145)
Total Bilirubin: 0.9 mg/dL (ref 0.3–1.2)
Total Protein: 7 g/dL (ref 6.0–8.3)

## 2013-03-29 LAB — LIPID PANEL
Cholesterol: 159 mg/dL (ref 0–200)
HDL: 47 mg/dL (ref >39)
LDL Cholesterol: 97 mg/dL (ref 0–99)
Total CHOL/HDL Ratio: 3.4 Ratio
Triglycerides: 73 mg/dL (ref <150)
VLDL: 15 mg/dL (ref 0–40)

## 2013-03-29 MED ORDER — LISINOPRIL 10 MG PO TABS: 10.0000 mg | ORAL_TABLET | Freq: Every day | ORAL | Status: DC

## 2013-03-29 MED ORDER — AMLODIPINE BESYLATE 5 MG PO TABS: 5.0000 mg | ORAL_TABLET | Freq: Every day | ORAL | Status: DC

## 2013-03-29 NOTE — Progress Notes (Signed)
  Subjective:    Patient ID: Jeffrey [name], male    DOB: 1956/07/16, 57 y.o.   MRN: [id number]  HPI This 57 y.o. male presents for evaluation of surgical clearance for right knee replacement.  He is scheduled for right knee surgery as soon as his physical clearance is done.  He is seeing Dr. Thomasena Edis Orthopedic surgery.  He has hx of OA and right knee DJD.  He states he has had a cardiolite stress test afew years ago when he was having some anxiety and he had complete cardiac w/u including stress testing which was negative.  He has no other complaints or medical problems.  He exercises and does a lot of long distance bike riding.  He averages 10 miles on the weekends.   Review of Systems    No chest pain, SOB, HA, dizziness, vision change, N/V, diarrhea, constipation, dysuria, urinary urgency or frequency, myalgias, arthralgias or rash.  Objective:   Physical Exam Vital signs noted  Well developed well nourished male.  HEENT - Head atraumatic Normocephalic                Eyes - PERRLA, Conjuctiva - clear Sclera- Clear EOMI                Ears - EAC's Wnl TM's Wnl Gross Hearing WNL                Nose - Nares patent                 Throat - oropharanx wnl Respiratory - Lungs CTA bilateral Cardiac - RRR S1 and S2 without murmur GI - Abdomen soft Nontender and bowel sounds active x 4 Extremities - No edema. Neuro - Grossly intact. MS- TTP right knee.  PFT - No change from baseline and FVC 102% Fev1 76% EKG - SB without any significant ST-T changes.     Assessment & Plan:  Pre-operative clearance - Plan: POCT CBC, Lipid panel, TSH, COMPLETE METABOLIC PANEL WITH GFR, lisinopril (PRINIVIL,ZESTRIL) 10 MG tablet, EKG 12-Lead, PR BREATHING CAPACITY TEST, EKG 12-Lead  EKG and PFT essentially normal.  Based upon his negative cardiac work up a couple years ago, and since he has no changes with his health since, recent weight loss, and regular exercise, he is low perioperative risk and is clear  medically for right knee arthroplasty.  Discussed with Dr. Modesto Charon and he agrees.  Physical exam - Plan: POCT CBC, Lipid panel, TSH, COMPLETE METABOLIC PANEL WITH GFR, amlodipine 5mg  po qd., EKG 12-Lead, PR BREATHING CAPACITY TEST  Essential hypertension, benign - Amlodipine 5mg  po qd.

## 2013-03-29 NOTE — Patient Instructions (Signed)

## 2013-03-30 LAB — TSH: TSH: 1.527 u[IU]/mL (ref 0.350–4.500)

## 2013-04-13 ENCOUNTER — Other Ambulatory Visit: Payer: Self-pay | Admitting: Orthopedic Surgery

## 2013-04-19 ENCOUNTER — Ambulatory Visit: Payer: Self-pay | Admitting: Family Medicine

## 2013-04-26 ENCOUNTER — Ambulatory Visit (INDEPENDENT_AMBULATORY_CARE_PROVIDER_SITE_OTHER): Payer: 59 | Admitting: Family Medicine

## 2013-04-26 VITALS — BP 120/64 | HR 70 | Temp 97.8°F | Ht 70.0 in | Wt 204.0 lb

## 2013-04-26 DIAGNOSIS — H109 Unspecified conjunctivitis: Secondary | ICD-10-CM

## 2013-04-26 MED ORDER — POLYMYXIN B-TRIMETHOPRIM 10000-0.1 UNIT/ML-% OP SOLN: 2.0000 [drp] | OPHTHALMIC | Status: DC

## 2013-04-26 NOTE — Progress Notes (Signed)
  Subjective:    Patient ID: Jeffrey [name], male    DOB: 1956-03-01, 57 y.o.   MRN: [id number]  HPI This 57 y.o. male presents for evaluation of irritation left eye.  He states he left his contact lenses In his left eye for over a day and when he took them out his left eye is irritated and has been for the Last 3 days.  He has not put his contacts back in and is wearing glasses.  He has no limited vision, photophobia Or severe pain.  He has moderated discomfort and feels like his eye may be scratched.   Review of Systems C/o left eye irritation.   No chest pain, SOB, HA, dizziness, vision change, N/V, diarrhea, constipation, dysuria, urinary urgency or frequency, myalgias, arthralgias or rash.  Objective:   Physical Exam  Vital signs noted  Well developed well nourished male.  HEENT - Head atraumatic Normocephalic                Eyes - PERRLA, Conjuctiva  OS - injected and swollen.  Tetracaine gtt placed in OS and then                floriscien placed and no corneal abrasion is appreciated.                  Ears - EAC's Wnl TM's Wnl Gross Hearing WNL               Respiratory - Lungs CTA bilateral Cardiac - RRR S1 and S2 without murmur       Assessment & Plan:  Conjunctivitis - Plan: trimethoprim-polymyxin b (POLYTRIM) ophthalmic solution Apply 2gtt's OS q4 hours today and then q5 hours tomorrow then q6 hours next day and continue for total of 7 days. Call if not better and will refer to opthamology if not better.

## 2013-04-26 NOTE — Patient Instructions (Addendum)

## 2013-05-03 ENCOUNTER — Ambulatory Visit: Payer: Self-pay | Admitting: Family Medicine

## 2013-05-13 ENCOUNTER — Ambulatory Visit (INDEPENDENT_AMBULATORY_CARE_PROVIDER_SITE_OTHER): Payer: 59 | Admitting: Nurse Practitioner

## 2013-05-13 ENCOUNTER — Encounter: Payer: Self-pay | Admitting: General Practice

## 2013-05-13 VITALS — BP 129/84 | HR 80 | Temp 98.7°F | Ht 70.0 in | Wt 209.0 lb

## 2013-05-13 DIAGNOSIS — J019 Acute sinusitis, unspecified: Secondary | ICD-10-CM

## 2013-05-13 MED ORDER — HYDROCODONE-HOMATROPINE 5-1.5 MG/5ML PO SYRP: 5.0000 mL | ORAL_SOLUTION | Freq: Three times a day (TID) | ORAL | Status: DC | PRN

## 2013-05-13 MED ORDER — AZITHROMYCIN 250 MG PO TABS: ORAL_TABLET | ORAL | Status: DC

## 2013-05-13 NOTE — Progress Notes (Signed)
  Subjective:    Patient ID: Jeffrey [name], male    DOB: Mar 05, 1956, 57 y.o.   MRN: [id number]  HPI Patinet in c/o cough and scatchy throat that started about 7 days ago- Is having knee surgery in September an dwants this taken care of so he won't have to reschedule his surgery.    Review of Systems  Constitutional: Positive for chills and fatigue. Negative for fever, activity change, appetite change and unexpected weight change.  HENT: Positive for congestion, sore throat, sneezing, postnasal drip and sinus pressure. Negative for ear pain and trouble swallowing.   Respiratory: Positive for cough (nonproductive).   Cardiovascular: Negative.        Objective:   Physical Exam  Constitutional: He appears well-developed and well-nourished. No distress.  HENT:  Right Ear: Hearing, tympanic membrane, external ear and ear canal normal.  Left Ear: Hearing, tympanic membrane, external ear and ear canal normal.  Nose: Mucosal edema and rhinorrhea present. Right sinus exhibits frontal sinus tenderness. Right sinus exhibits no maxillary sinus tenderness. Left sinus exhibits frontal sinus tenderness. Left sinus exhibits no maxillary sinus tenderness.  Mouth/Throat: Oropharynx is clear and moist and mucous membranes are normal.  Cardiovascular: Normal rate, regular rhythm and normal heart sounds.   Pulmonary/Chest: Effort normal and breath sounds normal.    BP 129/84  Pulse 80  Temp(Src) 98.7 F (37.1 C) (Oral)  Ht 5\' 10"  (1.778 m)  Wt 209 lb (94.802 kg)  BMI 29.99 kg/m2       Assessment & Plan:  1. Acute rhinosinusitis 1. Take meds as prescribed 2. Use a cool mist humidifier especially during the winter months and when heat has  been humid. 3. Use saline nose sprays frequently 4. Saline irrigations of the nose can be very helpful if done frequently.  * 4X daily for 1 week*  * Use of a nettie pot can be helpful with this. Follow directions with this* 5. Drink plenty of fluids 6. Keep  thermostat turn down low 7.For any cough or congestion  Use plain Mucinex- regular strength or max strength is fine   * Children- consult with Pharmacist for dosing 8. For fever or aces or pains- take tylenol or ibuprofen appropriate for age and weight.  * for fevers greater than 101 orally you may alternate ibuprofen and tylenol every  3 hours.   - azithromycin (ZITHROMAX) 250 MG tablet; As directed  Dispense: 6 each; Refill: 0 - HYDROcodone-homatropine (HYCODAN) 5-1.5 MG/5ML syrup; Take 5 mLs by mouth every 8 (eight) hours as needed for cough.  Dispense: 120 mL; Refill: 0  [name], FNP

## 2013-05-13 NOTE — Patient Instructions (Addendum)

## 2013-05-17 ENCOUNTER — Ambulatory Visit (INDEPENDENT_AMBULATORY_CARE_PROVIDER_SITE_OTHER): Payer: 59

## 2013-05-17 ENCOUNTER — Encounter: Payer: Self-pay | Admitting: Family Medicine

## 2013-05-17 ENCOUNTER — Ambulatory Visit (INDEPENDENT_AMBULATORY_CARE_PROVIDER_SITE_OTHER): Payer: 59 | Admitting: Family Medicine

## 2013-05-17 VITALS — BP 118/79 | HR 90 | Temp 99.6°F | Wt 203.2 lb

## 2013-05-17 DIAGNOSIS — R05 Cough: Secondary | ICD-10-CM

## 2013-05-17 DIAGNOSIS — J189 Pneumonia, unspecified organism: Secondary | ICD-10-CM | POA: Insufficient documentation

## 2013-05-17 DIAGNOSIS — R059 Cough, unspecified: Secondary | ICD-10-CM | POA: Insufficient documentation

## 2013-05-17 DIAGNOSIS — R509 Fever, unspecified: Secondary | ICD-10-CM | POA: Insufficient documentation

## 2013-05-17 HISTORY — DX: Cough, unspecified: R05.9

## 2013-05-17 LAB — POCT CBC
Granulocyte percent: 70.7 %G (ref 37–80)
HCT, POC: 43.8 % (ref 43.5–53.7)
Hemoglobin: 14.8 g/dL (ref 14.1–18.1)
Lymph, poc: 1.5 (ref 0.6–3.4)
MCH, POC: 28.7 pg (ref 27–31.2)
MCHC: 33.8 g/dL (ref 31.8–35.4)
MCV: 85 fL (ref 80–97)
MPV: 6.7 fL (ref 0–99.8)
POC Granulocyte: 5.2 (ref 2–6.9)
POC LYMPH PERCENT: 20.8 %L (ref 10–50)
Platelet Count, POC: 203 10*3/uL (ref 142–424)
RBC: 5.2 M/uL (ref 4.69–6.13)
RDW, POC: 12.9 %
WBC: 7.4 10*3/uL (ref 4.6–10.2)

## 2013-05-17 MED ORDER — LEVOFLOXACIN 750 MG PO TABS: 750.0000 mg | ORAL_TABLET | Freq: Every day | ORAL | Status: DC

## 2013-05-17 NOTE — Progress Notes (Signed)
Patient ID: Jeffrey [name], male   DOB: 1956-01-11, 57 y.o.   MRN: [id number] SUBJECTIVE: CC: Chief Complaint  Patient presents with  . Acute Visit    cough congestion yellow sputum    HPI: Was seen a few days ago. Getting worse. Feels bad. Had a high fever and early this am it broke with sweats. But a lot of yellow stuff coming out of the chest. No SOB, No wheezes  Past Medical History  Diagnosis Date  . Knee pain   . Hypertension    Past Surgical History  Procedure Laterality Date  . Neck surgery    . Wrist fracture surgery     History   Social History  . Marital Status: Married    Spouse Name: N/A    Number of Children: N/A  . Years of Education: N/A   Occupational History  . Not on file.   Social History Main Topics  . Smoking status: Never Smoker   . Smokeless tobacco: Not on file  . Alcohol Use: Yes  . Drug Use: No  . Sexual Activity: Not on file   Other Topics Concern  . Not on file   Social History Narrative  . No narrative on file   Family History  Problem Relation Age of Onset  . Hypertension Father   . Cancer Paternal Grandmother     colon   Current Outpatient Prescriptions on File Prior to Visit  Medication Sig Dispense Refill  . amLODipine (NORVASC) 5 MG tablet Take 1 tablet (5 mg total) by mouth daily.  90 tablet  3  . HYDROcodone-homatropine (HYCODAN) 5-1.5 MG/5ML syrup Take 5 mLs by mouth every 8 (eight) hours as needed for cough.  120 mL  0  . meloxicam (MOBIC) 7.5 MG tablet Take 7.5 mg by mouth daily.      Marland Kitchen trimethoprim-polymyxin b (POLYTRIM) ophthalmic solution Place 2 drops into the left eye every 4 (four) hours.  10 mL  0  . montelukast (SINGULAIR) 10 MG tablet Take 1 tablet (10 mg total) by mouth at bedtime.  30 tablet  0   No current facility-administered medications on file prior to visit.   Allergies  Allergen Reactions  . Sulfonamide Derivatives     REACTION: rash   Immunization History  Administered Date(s) Administered  .  Rabies, IM 02/15/2013, 02/18/2013, 02/22/2013, 03/01/2013  . Rabies, intradermal 02/15/2013  . Tdap 02/15/2013   Prior to Admission medications   Medication Sig Start Date End Date Taking? Authorizing Provider  amLODipine (NORVASC) 5 MG tablet Take 1 tablet (5 mg total) by mouth daily. 03/29/13  Yes Deatra Canter, FNP  HYDROcodone-homatropine [facility]) 5-1.5 MG/5ML syrup Take 5 mLs by mouth every 8 (eight) hours as needed for cough. 05/13/13  Yes [name], FNP  meloxicam (MOBIC) 7.5 MG tablet Take 7.5 mg by mouth daily.   Yes Historical Provider, MD  trimethoprim-polymyxin b (POLYTRIM) ophthalmic solution Place 2 drops into the left eye every 4 (four) hours. 04/26/13  Yes Deatra Canter, FNP  levofloxacin (LEVAQUIN) 750 MG tablet Take 1 tablet (750 mg total) by mouth daily. 05/17/13   Ileana Ladd, MD  montelukast (SINGULAIR) 10 MG tablet Take 1 tablet (10 mg total) by mouth at bedtime. 02/19/13   Ileana Ladd, MD     ROS: As above in the HPI. All other systems are stable or negative.  OBJECTIVE: APPEARANCE:  Patient in no acute distress.The patient appeared well nourished and normally developed. Acyanotic. Waist: VITAL  SIGNS:BP 118/79  Pulse 90  Temp(Src) 99.6 F (37.6 C) (Oral)  Wt 203 lb 3.2 oz (92.171 kg)  BMI 29.16 kg/m2 WM rattling cough  SKIN: warm and  Dry without overt rashes, tattoos and scars  HEAD and Neck: without JVD, Head and scalp: normal Eyes:No scleral icterus. Fundi normal, eye movements normal. Ears: Auricle normal, canal normal, Tympanic membranes normal, insufflation normal. Nose: normal Throat: normal Neck & thyroid: normal  CHEST & LUNGS: Chest wall: normal Lungs: Coarse breath sounds. Rales left midzone.  CVS: Reveals the PMI to be normally located. Regular rhythm, First and Second Heart sounds are normal,  absence of murmurs, rubs or gallops. Peripheral vasculature: Radial pulses: normal Dorsal pedis pulses: normal Posterior  pulses: normal  ABDOMEN:  Appearance: normal Benign, no organomegaly, no masses, no Abdominal Aortic enlargement. No Guarding , no rebound. No Bruits. Bowel sounds: normal  RECTAL: N/A GU: N/A  EXTREMETIES: nonedematous.  MUSCULOSKELETAL:  Spine: normal Joints: intact  NEUROLOGIC: oriented to time,place and person; nonfocal. Strength is normal Sensory is normal Reflexes are normal Cranial Nerves are normal.  ASSESSMENT: Cough - Plan: DG Chest 2 View, levofloxacin (LEVAQUIN) 750 MG tablet  Fever, unspecified - Plan: POCT CBC, levofloxacin (LEVAQUIN) 750 MG tablet  CAP (community acquired pneumonia)    PLAN:  Orders Placed This Encounter  Procedures  . DG Chest 2 View    Standing Status: Future     Number of Occurrences: 1     Standing Expiration Date: 07/17/2014    Order Specific Question:  Reason for Exam (SYMPTOM  OR DIAGNOSIS REQUIRED)    Answer:  cough    Order Specific Question:  Preferred imaging location?    Answer:  Internal  . POCT CBC    [facility] reading (PRIMARY) by  Dr. Modesto Charon: left hilar infiltrate, chronic changes.  Meds ordered this encounter  Medications  . levofloxacin (LEVAQUIN) 750 MG tablet    Sig: Take 1 tablet (750 mg total) by mouth daily.    Dispense:  7 tablet    Refill:  0   Results for orders placed in visit on 05/17/13  POCT CBC      Result Value Range   WBC 7.4  4.6 - 10.2 K/uL   Lymph, poc 1.5  0.6 - 3.4   POC LYMPH PERCENT 20.8  10 - 50 %L   MID (cbc)    0 - 0.9   POC MID %    0 - 12 %M   POC Granulocyte 5.2  2 - 6.9   Granulocyte percent 70.7  37 - 80 %G   RBC 5.2  4.69 - 6.13 M/uL   Hemoglobin 14.8  14.1 - 18.1 g/dL   HCT, POC 56.2  13.0 - 53.7 %   MCV 85.0  80 - 97 fL   MCH, POC 28.7  27 - 31.2 pg   MCHC 33.8  31.8 - 35.4 g/dL   RDW, POC 86.5     Platelet Count, POC 203.00  142 - 424 K/uL   MPV 6.7  0 - 99.8 fL    Return if symptoms worsen or fail to improve.  Stryder Poitra P. Modesto Charon, M.D.

## 2013-05-18 NOTE — Progress Notes (Signed)
Patient ID: Jeffrey [name], male   DOB: 06/27/1956, 57 y.o.   MRN: [id number] duplicate Anam Bobby P. Modesto Charon, M.D.

## 2013-05-19 ENCOUNTER — Encounter: Payer: Self-pay | Admitting: Family Medicine

## 2013-05-21 ENCOUNTER — Encounter (HOSPITAL_COMMUNITY): Payer: Self-pay | Admitting: Pharmacy Technician

## 2013-05-21 ENCOUNTER — Encounter (HOSPITAL_COMMUNITY)
Admission: RE | Admit: 2013-05-21 | Discharge: 2013-05-21 | Disposition: A | Discharge status: Home / Self Care | Payer: 59 | Source: Ambulatory Visit | Attending: Specialist | Admitting: Specialist

## 2013-05-21 ENCOUNTER — Encounter (HOSPITAL_COMMUNITY): Payer: Self-pay

## 2013-05-21 DIAGNOSIS — Z01812 Encounter for preprocedural laboratory examination: Secondary | ICD-10-CM | POA: Insufficient documentation

## 2013-05-21 DIAGNOSIS — M1710 Unilateral primary osteoarthritis, unspecified knee: Secondary | ICD-10-CM | POA: Insufficient documentation

## 2013-05-21 HISTORY — DX: Cough: R05

## 2013-05-21 HISTORY — DX: Unspecified osteoarthritis, unspecified site: M19.90

## 2013-05-21 HISTORY — DX: Pneumoconiosis due to asbestos and other mineral fibers: J61

## 2013-05-21 LAB — COMPREHENSIVE METABOLIC PANEL
AST: 18 U/L (ref 0–37)
BUN: 25 mg/dL — ABNORMAL HIGH (ref 6–23)
CO2: 26 mEq/L (ref 19–32)
Calcium: 9.7 mg/dL (ref 8.4–10.5)
Creatinine, Ser: 1.14 mg/dL (ref 0.50–1.35)
GFR calc Af Amer: 81 mL/min — ABNORMAL LOW (ref >90)
GFR calc non Af Amer: 70 mL/min — ABNORMAL LOW (ref >90)
Glucose, Bld: 83 mg/dL (ref 70–99)

## 2013-05-21 LAB — SURGICAL PCR SCREEN
MRSA, PCR: NEGATIVE
Staphylococcus aureus: NEGATIVE

## 2013-05-21 LAB — CBC
MCH: 29 pg (ref 26.0–34.0)
MCV: 84.1 fL (ref 78.0–100.0)
Platelets: 208 10*3/uL (ref 150–400)
RBC: 5.17 MIL/uL (ref 4.22–5.81)

## 2013-05-21 LAB — URINALYSIS, ROUTINE W REFLEX MICROSCOPIC
Ketones, ur: NEGATIVE mg/dL
Leukocytes, UA: NEGATIVE
Nitrite: NEGATIVE
Protein, ur: NEGATIVE mg/dL
Urobilinogen, UA: 0.2 mg/dL (ref 0.0–1.0)

## 2013-05-21 LAB — PROTIME-INR
INR: 1.01 (ref 0.00–1.49)
Prothrombin Time: 13.1 seconds (ref 11.6–15.2)

## 2013-05-21 NOTE — Patient Instructions (Addendum)
YOUR SURGERY IS SCHEDULED AT [facility]  ON:  Friday  9/12  REPORT TO [place] SHORT STAY CENTER AT:  10:30 AM      PHONE # FOR SHORT STAY IS [phone]  DO NOT EAT  ANYTHING AFTER MIDNIGHT THE NIGHT BEFORE YOUR SURGERY.   NO FOOD, NO CHEWING GUM, NO MINTS, NO CANDIES, NO CHEWING TOBACCO. YOU MAY HAVE CLEAR LIQUIDS TO DRINK FROM MIDNIGHT UNTIL 7:00 AM DAY OF SURGERY - LIKE WATER, COFFEE - NO MILK OR CREAM, APPLE JUICE.   NOTHING TO DRINK AFTER 7:00 AM DAY OF YOUR SURGERY.  PLEASE TAKE THE FOLLOWING MEDICATIONS THE AM OF YOUR SURGERY WITH A FEW SIPS OF WATER:  AMLODIPINE     DO NOT BRING VALUABLES, MONEY, CREDIT CARDS.  DO NOT WEAR JEWELRY, MAKE-UP, NAIL POLISH AND NO METAL PINS OR CLIPS IN YOUR HAIR. CONTACT LENS, DENTURES / PARTIALS, GLASSES SHOULD NOT BE WORN TO SURGERY AND IN MOST CASES-HEARING AIDS WILL NEED TO BE REMOVED.  BRING YOUR GLASSES CASE, ANY EQUIPMENT NEEDED FOR YOUR CONTACT LENS. FOR PATIENTS ADMITTED TO THE HOSPITAL--CHECK OUT TIME THE DAY OF DISCHARGE IS 11:00 AM.  ALL INPATIENT ROOMS ARE PRIVATE - WITH BATHROOM, TELEPHONE, TELEVISION AND WIFI INTERNET.  PLEASE READ OVER ANY  FACT SHEETS THAT YOU WERE GIVEN: MRSA INFORMATION, BLOOD TRANSFUSION INFORMATION, INCENTIVE SPIROMETER INFORMATION. FAILURE TO FOLLOW THESE INSTRUCTIONS MAY RESULT IN THE CANCELLATION OF YOUR SURGERY.   PATIENT SIGNATURE_________________________________

## 2013-05-21 NOTE — Pre-Procedure Instructions (Signed)
PT HAS CXR REPORT IN [facility] 05/17/13 AND EKG REPORT IN [facility] 03/29/13.

## 2013-05-21 NOTE — Pre-Procedure Instructions (Signed)
PT'S CMET REPORT FAXED TO DR. France Ravens OFFICE - BUN 25.

## 2013-05-25 ENCOUNTER — Other Ambulatory Visit: Payer: Self-pay | Admitting: Family Medicine

## 2013-05-25 DIAGNOSIS — R0989 Other specified symptoms and signs involving the circulatory and respiratory systems: Secondary | ICD-10-CM

## 2013-05-25 MED ORDER — ALBUTEROL SULFATE HFA 108 (90 BASE) MCG/ACT IN AERS: 2.0000 | INHALATION_SPRAY | Freq: Four times a day (QID) | RESPIRATORY_TRACT | Status: DC | PRN

## 2013-05-28 ENCOUNTER — Ambulatory Visit: Payer: 59 | Admitting: Family Medicine

## 2013-05-31 ENCOUNTER — Ambulatory Visit (INDEPENDENT_AMBULATORY_CARE_PROVIDER_SITE_OTHER): Payer: 59 | Admitting: Family Medicine

## 2013-05-31 ENCOUNTER — Ambulatory Visit (INDEPENDENT_AMBULATORY_CARE_PROVIDER_SITE_OTHER): Payer: 59

## 2013-05-31 ENCOUNTER — Encounter: Payer: Self-pay | Admitting: Family Medicine

## 2013-05-31 VITALS — BP 128/82 | HR 83 | Temp 97.1°F | Wt 204.8 lb

## 2013-05-31 DIAGNOSIS — R059 Cough, unspecified: Secondary | ICD-10-CM

## 2013-05-31 DIAGNOSIS — R062 Wheezing: Secondary | ICD-10-CM

## 2013-05-31 DIAGNOSIS — R509 Fever, unspecified: Secondary | ICD-10-CM

## 2013-05-31 DIAGNOSIS — R05 Cough: Secondary | ICD-10-CM

## 2013-05-31 DIAGNOSIS — J189 Pneumonia, unspecified organism: Secondary | ICD-10-CM

## 2013-05-31 NOTE — H&P (Signed)
TOTAL KNEE ADMISSION H&P  Patient is being admitted for right total knee arthroplasty.  Subjective:  Chief Complaint:right knee pain.  HPI: Jeffrey [name], 57 y.o. male, has a history of pain and functional disability in the right knee due to arthritis and has failed non-surgical conservative treatments for greater than 12 weeks to includeNSAID's and/or analgesics, corticosteriod injections, viscosupplementation injections, weight reduction as appropriate and activity modification.  Onset of symptoms was gradual, starting 2 years ago with gradually worsening course since that time. The patient noted prior procedures on the knee to include  arthroscopy on the right knee(s).  Patient currently rates pain in the right knee(s) at 6 out of 10 with activity. Patient has night pain, worsening of pain with activity and weight bearing, pain that interferes with activities of daily living, pain with passive range of motion and joint swelling.  Patient has evidence of subchondral cysts, subchondral sclerosis and joint space narrowing by imaging studies. This patient has had Osteoarthritis. There is no active infection.  Patient Active Problem List   Diagnosis Date Noted  . CAP (community acquired pneumonia) 05/17/2013  . Fever, unspecified 05/17/2013  . Cough 05/17/2013   Past Medical History  Diagnosis Date  . Knee pain     RIHGT KNEE OA AND PAIN  . Hypertension   . Cough 05/17/13    PRODUCTIVE COUGH, YELLOW PHLEGM, FEVER - SAW DR. Modesto Charon AND GIVEN LEVOFLOXACIN - AND WILL FOLLOW UP WITH DR. Modesto Charon ON 9/8  . Arthritis   . Pulmonary asbestosis     MILD - PER PT - "DOESN'T SEEM TO BE CAUSING ANY PROBLEMS"  PT IS FOLLOWED BY SPECIALIST IN [place] EVERY YEAR WITH BREATHING TEST AND CT SCANS    Past Surgical History  Procedure Laterality Date  . Neck surgery  ? 1988    CERVICAL FOR HNP  . Wrist fracture surgery Right 2010  . Hernia repair       2 SEPARATE SURGERIES FOR RIGHT AND FOR LEFT INGUINAL HERNIA  REPAIR    No prescriptions prior to admission   Allergies  Allergen Reactions  . Sulfonamide Derivatives     REACTION: rash    History  Substance Use Topics  . Smoking status: Never Smoker   . Smokeless tobacco: Never Used  . Alcohol Use: Yes     Comment: OCCAS ALCOHOL - MAYBE 2 DRINKS A MONTH    Family History  Problem Relation Age of Onset  . Hypertension Father   . Cancer Paternal Grandmother     colon     Review of Systems  Constitutional: Negative.   HENT: Negative.   Eyes: Negative.   Respiratory: Positive for cough.        Asbestosis in 1996  Cardiovascular:       HTN  Controlled with medications  Gastrointestinal: Negative.   Genitourinary:       Kidney stones 2013  Musculoskeletal: Positive for joint pain (Right knee).       Right wrist fracture hx.  Skin: Negative.   Neurological: Negative.   Endo/Heme/Allergies: Negative.   Psychiatric/Behavioral: Negative.     Objective:  Physical Exam  Constitutional: He is oriented to person, place, and time. He appears well-developed and well-nourished.  HENT:  Head: Normocephalic.  Eyes: EOM are normal.  Neck: Normal range of motion. Neck supple.  Cardiovascular: Normal rate, regular rhythm, normal heart sounds and intact distal pulses.   Respiratory: Effort normal and breath sounds normal.  GI: Soft. Bowel sounds are normal.  Genitourinary:  Deffered  Musculoskeletal: He exhibits edema and tenderness.  Right knee symptoms as noted. Calf bilateral soft and non-tender.  Neurological: He is alert and oriented to person, place, and time. He has normal reflexes.  Skin: Skin is warm and dry.  Psychiatric: His behavior is normal.    Vital signs in last 24 hours: Temp:  [97.1 F (36.2 C)] 97.1 F (36.2 C) (09/08 0842) Pulse Rate:  [83] 83 (09/08 0842) BP: (128)/(82) 128/82 mmHg (09/08 0842) Weight:  [92.897 kg (204 lb 12.8 oz)] 92.897 kg (204 lb 12.8 oz) (09/08 0842)  Labs:   Estimated body mass index  is 29.44 kg/(m^2) as calculated from the following:   Height as of 03/29/13: 5\' 10"  (1.778 m).   Weight as of 03/29/13: 93.078 kg (205 lb 3.2 oz).   Imaging Review Plain radiographs demonstrate moderate degenerative joint disease of the right knee(s). The overall alignment ismild varus. The bone quality appears to be good for age and reported activity level.  Assessment/Plan:  End stage arthritis, right knee   The patient history, physical examination, clinical judgment of the provider and imaging studies are consistent with end stage degenerative joint disease of the right knee(s) and total knee arthroplasty is deemed medically necessary. The treatment options including medical management, injection therapy arthroscopy and arthroplasty were discussed at length. The risks and benefits of total knee arthroplasty were presented and reviewed. The risks due to aseptic loosening, infection, stiffness, patella tracking problems, thromboembolic complications and other imponderables were discussed. The patient acknowledged the explanation, agreed to proceed with the plan and consent was signed. Patient is being admitted for inpatient treatment for surgery, pain control, PT, OT, prophylactic antibiotics, VTE prophylaxis, progressive ambulation and ADL's and discharge planning. The patient is planning to be discharged home with home health services

## 2013-05-31 NOTE — Progress Notes (Signed)
Patient ID: MAXDEN NAJI, male   DOB: 12/13/1955, 57 y.o.   MRN: [id number] SUBJECTIVE: CC:  Chief Complaint  Patient presents with  . Follow-up    reck lungs seeing dr Thomasena Edis     HPI: Better. [place] the beach [place] and was wheezing. Albuterol was called in and he is good now. Came to get recheck prior to Orthopedic surgery.  Past Medical History  Diagnosis Date  . Knee pain     RIHGT KNEE OA AND PAIN  . Hypertension   . Cough 05/17/13    PRODUCTIVE COUGH, YELLOW PHLEGM, FEVER - SAW DR. Modesto Charon AND GIVEN LEVOFLOXACIN - AND WILL FOLLOW UP WITH DR. Modesto Charon ON 9/8  . Arthritis   . Pulmonary asbestosis     MILD - PER PT - "DOESN'T SEEM TO BE CAUSING ANY PROBLEMS"  PT IS FOLLOWED BY SPECIALIST IN [place] EVERY YEAR WITH BREATHING TEST AND CT SCANS   Past Surgical History  Procedure Laterality Date  . Neck surgery  ? 1988    CERVICAL FOR HNP  . Wrist fracture surgery Right 2010  . Hernia repair       2 SEPARATE SURGERIES FOR RIGHT AND FOR LEFT INGUINAL HERNIA REPAIR   History   Social History  . Marital Status: Married    Spouse Name: N/A    Number of Children: N/A  . Years of Education: N/A   Occupational History  . Not on file.   Social History Main Topics  . Smoking status: Never Smoker   . Smokeless tobacco: Never Used  . Alcohol Use: Yes     Comment: OCCAS ALCOHOL - MAYBE 2 DRINKS A MONTH  . Drug Use: No  . Sexual Activity: Not on file   Other Topics Concern  . Not on file   Social History Narrative  . No narrative on file   Family History  Problem Relation Age of Onset  . Hypertension Father   . Cancer Paternal Grandmother     colon   Current Outpatient Prescriptions on File Prior to Visit  Medication Sig Dispense Refill  . albuterol (PROVENTIL HFA;VENTOLIN HFA) 108 (90 BASE) MCG/ACT inhaler Inhale 2 puffs into the lungs every 6 (six) hours as needed for wheezing.  1 Inhaler  2  . amLODipine (NORVASC) 5 MG tablet Take 5 mg by mouth every morning.       Marland Kitchen levofloxacin (LEVAQUIN) 750 MG tablet Take 1 tablet (750 mg total) by mouth daily.  7 tablet  0  . lisinopril (PRINIVIL,ZESTRIL) 10 MG tablet Take 10 mg by mouth every morning.      . meloxicam (MOBIC) 15 MG tablet Take 15 mg by mouth daily.       No current facility-administered medications on file prior to visit.   Allergies  Allergen Reactions  . Sulfonamide Derivatives     REACTION: rash   Immunization History  Administered Date(s) Administered  . Rabies, IM 02/15/2013, 02/18/2013, 02/22/2013, 03/01/2013  . Rabies, intradermal 02/15/2013  . Tdap 02/15/2013   Prior to Admission medications   Medication Sig Start Date End Date Taking? Authorizing Provider  albuterol (PROVENTIL HFA;VENTOLIN HFA) 108 (90 BASE) MCG/ACT inhaler Inhale 2 puffs into the lungs every 6 (six) hours as needed for wheezing. 05/25/13  Yes Ileana Ladd, MD  amLODipine (NORVASC) 5 MG tablet Take 5 mg by mouth every morning.   Yes Historical Provider, MD  levofloxacin (LEVAQUIN) 750 MG tablet Take 1 tablet (750 mg total) by mouth daily.  05/17/13  Yes Ileana Ladd, MD  lisinopril (PRINIVIL,ZESTRIL) 10 MG tablet Take 10 mg by mouth every morning.   Yes Historical Provider, MD  meloxicam (MOBIC) 15 MG tablet Take 15 mg by mouth daily.   Yes Historical Provider, MD     ROS: As above in the HPI. All other systems are stable or negative.  OBJECTIVE: APPEARANCE:  Patient in no acute distress.The patient appeared well nourished and normally developed. Acyanotic. Waist: VITAL SIGNS:BP 128/82  Pulse 83  Temp(Src) 97.1 F (36.2 C) (Oral)  Wt 204 lb 12.8 oz (92.897 kg)  BMI 29.39 kg/m2 WM Tanned looks well  SKIN: warm and  Dry without overt rashes, tattoos and scars  HEAD and Neck: without JVD, Head and scalp: normal Eyes:No scleral icterus. Fundi normal, eye movements normal. Ears: Auricle normal, canal normal, Tympanic membranes normal, insufflation normal. Nose: normal Throat: normal Neck &  thyroid: normal  CHEST & LUNGS: Chest wall: normal Lungs: Coarse BS  CVS: Reveals the PMI to be normally located. Regular rhythm, First and Second Heart sounds are normal,  absence of murmurs, rubs or gallops. Peripheral vasculature: Radial pulses: normal Dorsal pedis pulses: normal Posterior pulses: normal  ABDOMEN:  Appearance: normal Benign, no organomegaly, no masses, no Abdominal Aortic enlargement. No Guarding , no rebound. No Bruits. Bowel sounds: normal  RECTAL: N/A GU: N/A  EXTREMETIES: nonedematous.  NEUROLOGIC: oriented to time,place and person; nonfocal. Strength is normal Sensory is normal Reflexes are normal Cranial Nerves are normal.   ASSESSMENT: Wheezing - resolved - Plan: Spirometry with graph  Cough - better - Plan: DG Chest 2 View  CAP (community acquired pneumonia) - resolved  Fever, unspecified - resolved   PLAN: [facility] reading (PRIMARY) by  Dr. Modesto Charon: no acute findings.                               Orders Placed This Encounter  Procedures  . DG Chest 2 View    Standing Status: Future     Number of Occurrences: 1     Standing Expiration Date: 07/31/2014    Order Specific Question:  Reason for Exam (SYMPTOM  OR DIAGNOSIS REQUIRED)    Answer:  cough    Order Specific Question:  Preferred imaging location?    Answer:  Internal  . Spirometry with graph    Standing Status: Future     Number of Occurrences:      Standing Expiration Date: 05/31/2014   Spirometry: to be scanned in but FEV1:127% FVC:128% Interpretation: Normal  Start to wean off the inhaler and have it on hand to use prn.  Okay for his ortho surgery . Cleared for surgery. Will let Dr Thomasena Edis know.  RTc prn.  Gillis Boardley P. Modesto Charon, M.D.

## 2013-06-04 ENCOUNTER — Inpatient Hospital Stay (HOSPITAL_COMMUNITY)
Admission: RE | Admit: 2013-06-04 | Discharge: 2013-06-07 | DRG: 470 | Disposition: A | Discharge status: Home Health | Payer: 59 | Source: Ambulatory Visit | Attending: Specialist | Admitting: Specialist

## 2013-06-04 ENCOUNTER — Encounter (HOSPITAL_COMMUNITY)
Admission: RE | Disposition: A | Discharge status: Home Health | Payer: Self-pay | Source: Ambulatory Visit | Attending: Specialist

## 2013-06-04 ENCOUNTER — Encounter (HOSPITAL_COMMUNITY): Payer: Self-pay | Admitting: Anesthesiology

## 2013-06-04 ENCOUNTER — Encounter (HOSPITAL_COMMUNITY): Payer: Self-pay | Admitting: *Deleted

## 2013-06-04 ENCOUNTER — Inpatient Hospital Stay (HOSPITAL_COMMUNITY): Payer: 59 | Admitting: Anesthesiology

## 2013-06-04 DIAGNOSIS — I10 Essential (primary) hypertension: Secondary | ICD-10-CM | POA: Diagnosis present

## 2013-06-04 DIAGNOSIS — M1710 Unilateral primary osteoarthritis, unspecified knee: Principal | ICD-10-CM | POA: Diagnosis present

## 2013-06-04 DIAGNOSIS — J61 Pneumoconiosis due to asbestos and other mineral fibers: Secondary | ICD-10-CM | POA: Diagnosis present

## 2013-06-04 DIAGNOSIS — Z87442 Personal history of urinary calculi: Secondary | ICD-10-CM

## 2013-06-04 DIAGNOSIS — Z96659 Presence of unspecified artificial knee joint: Secondary | ICD-10-CM

## 2013-06-04 HISTORY — PX: TOTAL KNEE ARTHROPLASTY: SHX125

## 2013-06-04 SURGERY — ARTHROPLASTY, KNEE, TOTAL
Anesthesia: Spinal | Site: Knee | Laterality: Right | Wound class: Clean

## 2013-06-04 MED ORDER — PROPOFOL INFUSION 10 MG/ML OPTIME
INTRAVENOUS | Status: DC | PRN
  Administered 2013-06-04: 100 ug/kg/min via INTRAVENOUS

## 2013-06-04 MED ORDER — PHENYLEPHRINE HCL 10 MG/ML IJ SOLN
10.0000 mg | INTRAVENOUS | Status: DC | PRN
  Administered 2013-06-04: 30 ug/min via INTRAVENOUS

## 2013-06-04 MED ORDER — KETOROLAC TROMETHAMINE 30 MG/ML IJ SOLN
INTRAMUSCULAR | Status: DC | PRN
  Administered 2013-06-04: 30 mg via INTRAMUSCULAR

## 2013-06-04 MED ORDER — AMLODIPINE BESYLATE 5 MG PO TABS
5.0000 mg | ORAL_TABLET | Freq: Every morning | ORAL | Status: DC
  Administered 2013-06-05 – 2013-06-07 (×3): 5 mg via ORAL
  Filled 2013-06-04 (×3): qty 1

## 2013-06-04 MED ORDER — MORPHINE SULFATE (PF) 0.5 MG/ML IJ SOLN
INTRAMUSCULAR | Status: DC | PRN
  Administered 2013-06-04: 200 ug via INTRATHECAL

## 2013-06-04 MED ORDER — FENTANYL CITRATE 0.05 MG/ML IJ SOLN
INTRAMUSCULAR | Status: DC | PRN
  Administered 2013-06-04 (×2): 50 ug via INTRAVENOUS

## 2013-06-04 MED ORDER — STERILE WATER FOR IRRIGATION IR SOLN
Status: DC | PRN
  Administered 2013-06-04: 3000 mL

## 2013-06-04 MED ORDER — BUPIVACAINE-EPINEPHRINE 0.25% -1:200000 IJ SOLN
INTRAMUSCULAR | Status: DC | PRN
  Administered 2013-06-04: 25 mL

## 2013-06-04 MED ORDER — MIDAZOLAM HCL 5 MG/5ML IJ SOLN
INTRAMUSCULAR | Status: DC | PRN
  Administered 2013-06-04 (×2): 1 mg via INTRAVENOUS

## 2013-06-04 MED ORDER — LISINOPRIL 10 MG PO TABS
10.0000 mg | ORAL_TABLET | Freq: Every morning | ORAL | Status: DC
  Administered 2013-06-06 – 2013-06-07 (×2): 10 mg via ORAL
  Filled 2013-06-04 (×3): qty 1

## 2013-06-04 MED ORDER — METHOCARBAMOL 500 MG PO TABS
500.0000 mg | ORAL_TABLET | Freq: Four times a day (QID) | ORAL | Status: DC | PRN
  Administered 2013-06-05 (×2): 500 mg via ORAL
  Filled 2013-06-04 (×3): qty 1

## 2013-06-04 MED ORDER — ENOXAPARIN SODIUM 40 MG/0.4ML ~~LOC~~ SOLN
40.0000 mg | SUBCUTANEOUS | Status: DC
  Administered 2013-06-05 – 2013-06-07 (×3): 40 mg via SUBCUTANEOUS
  Filled 2013-06-04 (×4): qty 0.4

## 2013-06-04 MED ORDER — PHENYLEPHRINE HCL 10 MG/ML IJ SOLN
INTRAMUSCULAR | Status: DC | PRN
  Administered 2013-06-04 (×5): 40 ug via INTRAVENOUS

## 2013-06-04 MED ORDER — BUPIVACAINE-EPINEPHRINE PF 0.25-1:200000 % IJ SOLN
INTRAMUSCULAR | Status: AC
  Filled 2013-06-04: qty 30

## 2013-06-04 MED ORDER — CEFAZOLIN SODIUM-DEXTROSE 2-3 GM-% IV SOLR
2.0000 g | INTRAVENOUS | Status: AC
  Administered 2013-06-04: 2 g via INTRAVENOUS

## 2013-06-04 MED ORDER — LACTATED RINGERS IV SOLN
INTRAVENOUS | Status: DC
  Administered 2013-06-04 (×2): via INTRAVENOUS
  Administered 2013-06-04: 1000 mL via INTRAVENOUS

## 2013-06-04 MED ORDER — FERROUS SULFATE 325 (65 FE) MG PO TABS
325.0000 mg | ORAL_TABLET | Freq: Three times a day (TID) | ORAL | Status: DC
  Administered 2013-06-05 – 2013-06-07 (×7): 325 mg via ORAL
  Filled 2013-06-04 (×11): qty 1

## 2013-06-04 MED ORDER — OXYCODONE-ACETAMINOPHEN 5-325 MG PO TABS
1.0000 | ORAL_TABLET | ORAL | Status: DC | PRN
  Administered 2013-06-04: 1 via ORAL
  Administered 2013-06-05 (×3): 2 via ORAL
  Administered 2013-06-05 (×2): 1 via ORAL
  Filled 2013-06-04: qty 1
  Filled 2013-06-04: qty 2
  Filled 2013-06-04: qty 1
  Filled 2013-06-04 (×3): qty 2

## 2013-06-04 MED ORDER — DIPHENHYDRAMINE HCL 12.5 MG/5ML PO ELIX: 12.5000 mg | ORAL_SOLUTION | ORAL | Status: DC | PRN

## 2013-06-04 MED ORDER — CEFAZOLIN SODIUM-DEXTROSE 2-3 GM-% IV SOLR
INTRAVENOUS | Status: AC
  Filled 2013-06-04: qty 50

## 2013-06-04 MED ORDER — SODIUM CHLORIDE 0.9 % IR SOLN
Status: DC | PRN
  Administered 2013-06-04: 1000 mL

## 2013-06-04 MED ORDER — METOCLOPRAMIDE HCL 10 MG PO TABS: 5.0000 mg | ORAL_TABLET | Freq: Three times a day (TID) | ORAL | Status: DC | PRN

## 2013-06-04 MED ORDER — POTASSIUM CHLORIDE IN NACL 20-0.9 MEQ/L-% IV SOLN
INTRAVENOUS | Status: DC
  Administered 2013-06-04 – 2013-06-05 (×2): via INTRAVENOUS
  Filled 2013-06-04 (×5): qty 1000

## 2013-06-04 MED ORDER — BISACODYL 5 MG PO TBEC: 5.0000 mg | DELAYED_RELEASE_TABLET | Freq: Every day | ORAL | Status: DC | PRN

## 2013-06-04 MED ORDER — HYDROMORPHONE HCL PF 1 MG/ML IJ SOLN: 0.2500 mg | INTRAMUSCULAR | Status: DC | PRN

## 2013-06-04 MED ORDER — SODIUM CHLORIDE 0.9 % IJ SOLN
INTRAMUSCULAR | Status: DC | PRN
  Administered 2013-06-04: 14 mL

## 2013-06-04 MED ORDER — ONDANSETRON HCL 4 MG/2ML IJ SOLN
4.0000 mg | Freq: Four times a day (QID) | INTRAMUSCULAR | Status: DC | PRN
  Administered 2013-06-05: 4 mg via INTRAVENOUS
  Filled 2013-06-04: qty 2

## 2013-06-04 MED ORDER — BUPIVACAINE LIPOSOME 1.3 % IJ SUSP
INTRAMUSCULAR | Status: DC | PRN
  Administered 2013-06-04: 20 mL

## 2013-06-04 MED ORDER — FLEET ENEMA 7-19 GM/118ML RE ENEM: 1.0000 | ENEMA | Freq: Once | RECTAL | Status: AC | PRN

## 2013-06-04 MED ORDER — POVIDONE-IODINE 7.5 % EX SOLN: Freq: Once | CUTANEOUS | Status: DC

## 2013-06-04 MED ORDER — LACTATED RINGERS IV SOLN: INTRAVENOUS | Status: DC

## 2013-06-04 MED ORDER — CEFAZOLIN SODIUM-DEXTROSE 2-3 GM-% IV SOLR
2.0000 g | Freq: Four times a day (QID) | INTRAVENOUS | Status: AC
  Administered 2013-06-04 – 2013-06-05 (×3): 2 g via INTRAVENOUS
  Filled 2013-06-04 (×3): qty 50

## 2013-06-04 MED ORDER — METOCLOPRAMIDE HCL 5 MG/ML IJ SOLN: 5.0000 mg | Freq: Three times a day (TID) | INTRAMUSCULAR | Status: DC | PRN

## 2013-06-04 MED ORDER — MORPHINE SULFATE 0.5 MG/ML IJ SOLN: INTRAMUSCULAR | Status: DC | PRN

## 2013-06-04 MED ORDER — BUPIVACAINE LIPOSOME 1.3 % IJ SUSP
20.0000 mL | Freq: Once | INTRAMUSCULAR | Status: DC
  Filled 2013-06-04: qty 20

## 2013-06-04 MED ORDER — HYDROMORPHONE HCL PF 1 MG/ML IJ SOLN
0.5000 mg | INTRAMUSCULAR | Status: DC | PRN
  Administered 2013-06-05 (×3): 1 mg via INTRAVENOUS
  Filled 2013-06-04 (×3): qty 1

## 2013-06-04 MED ORDER — ALBUTEROL SULFATE HFA 108 (90 BASE) MCG/ACT IN AERS
2.0000 | INHALATION_SPRAY | Freq: Four times a day (QID) | RESPIRATORY_TRACT | Status: DC | PRN
  Filled 2013-06-04: qty 6.7

## 2013-06-04 MED ORDER — MORPHINE SULFATE 0.5 MG/ML IJ SOLN
INTRAMUSCULAR | Status: AC
  Filled 2013-06-04: qty 10

## 2013-06-04 MED ORDER — 0.9 % SODIUM CHLORIDE (POUR BTL) OPTIME
TOPICAL | Status: DC | PRN
  Administered 2013-06-04: 1000 mL

## 2013-06-04 MED ORDER — DOCUSATE SODIUM 100 MG PO CAPS
100.0000 mg | ORAL_CAPSULE | Freq: Two times a day (BID) | ORAL | Status: DC
  Administered 2013-06-04 – 2013-06-07 (×6): 100 mg via ORAL

## 2013-06-04 MED ORDER — METHOCARBAMOL 100 MG/ML IJ SOLN
500.0000 mg | Freq: Four times a day (QID) | INTRAVENOUS | Status: DC | PRN
  Administered 2013-06-04: 500 mg via INTRAVENOUS
  Filled 2013-06-04 (×2): qty 5

## 2013-06-04 MED ORDER — POLYETHYLENE GLYCOL 3350 17 G PO PACK
17.0000 g | PACK | Freq: Every day | ORAL | Status: DC | PRN
  Administered 2013-06-05 – 2013-06-06 (×2): 17 g via ORAL
  Filled 2013-06-04: qty 1

## 2013-06-04 MED ORDER — SODIUM CHLORIDE 0.9 % IJ SOLN
INTRAMUSCULAR | Status: AC
  Filled 2013-06-04: qty 50

## 2013-06-04 MED ORDER — KETOROLAC TROMETHAMINE 30 MG/ML IJ SOLN
INTRAMUSCULAR | Status: AC
  Filled 2013-06-04: qty 1

## 2013-06-04 MED ORDER — SODIUM CHLORIDE 0.9 % IV SOLN: INTRAVENOUS | Status: DC

## 2013-06-04 MED ORDER — BUPIVACAINE HCL (PF) 0.75 % IJ SOLN
INTRAMUSCULAR | Status: DC | PRN
  Administered 2013-06-04: 15 mg

## 2013-06-04 MED ORDER — ONDANSETRON HCL 4 MG PO TABS: 4.0000 mg | ORAL_TABLET | Freq: Four times a day (QID) | ORAL | Status: DC | PRN

## 2013-06-04 MED ORDER — ZOLPIDEM TARTRATE 5 MG PO TABS
5.0000 mg | ORAL_TABLET | Freq: Every evening | ORAL | Status: DC | PRN
  Filled 2013-06-04: qty 1

## 2013-06-04 SURGICAL SUPPLY — 73 items
ADH SKN CLS APL DERMABOND .7 (GAUZE/BANDAGES/DRESSINGS) ×1
BAG SPEC THK2 15X12 ZIP CLS (MISCELLANEOUS) ×2
BAG ZIPLOCK 12X15 (MISCELLANEOUS) ×4 IMPLANT
BANDAGE ELASTIC 4 VELCRO ST LF (GAUZE/BANDAGES/DRESSINGS) ×2 IMPLANT
BANDAGE ELASTIC 6 VELCRO ST LF (GAUZE/BANDAGES/DRESSINGS) ×2 IMPLANT
BANDAGE ESMARK 6X9 LF (GAUZE/BANDAGES/DRESSINGS) ×1 IMPLANT
BANDAGE GAUZE ELAST BULKY 4 IN (GAUZE/BANDAGES/DRESSINGS) ×2 IMPLANT
BLADE SAG 18X100X1.27 (BLADE) ×2 IMPLANT
BLADE SAW SGTL 13.0X1.19X90.0M (BLADE) ×2 IMPLANT
BNDG CMPR 9X6 STRL LF SNTH (GAUZE/BANDAGES/DRESSINGS) ×1
BNDG ESMARK 6X9 LF (GAUZE/BANDAGES/DRESSINGS) ×2
CAPT RP KNEE ×1 IMPLANT
CEMENT HV SMART SET (Cement) ×2 IMPLANT
CLOTH BEACON ORANGE TIMEOUT ST (SAFETY) ×2 IMPLANT
CUFF TOURN SGL QUICK 34 (TOURNIQUET CUFF) ×2
CUFF TRNQT CYL 34X4X40X1 (TOURNIQUET CUFF) ×1 IMPLANT
DERMABOND ADVANCED (GAUZE/BANDAGES/DRESSINGS) ×1
DERMABOND ADVANCED .7 DNX12 (GAUZE/BANDAGES/DRESSINGS) ×1 IMPLANT
DRAPE EXTREMITY T 121X128X90 (DRAPE) ×2 IMPLANT
DRAPE LG THREE QUARTER DISP (DRAPES) ×2 IMPLANT
DRAPE POUCH INSTRU U-SHP 10X18 (DRAPES) ×2 IMPLANT
DRAPE U-SHAPE 47X51 STRL (DRAPES) ×2 IMPLANT
DRSG AQUACEL AG ADV 3.5X10 (GAUZE/BANDAGES/DRESSINGS) ×2 IMPLANT
DRSG PAD ABDOMINAL 8X10 ST (GAUZE/BANDAGES/DRESSINGS) ×4 IMPLANT
DRSG TEGADERM 4X4.75 (GAUZE/BANDAGES/DRESSINGS) ×2 IMPLANT
DURAPREP 26ML APPLICATOR (WOUND CARE) ×2 IMPLANT
ELECT REM PT RETURN 9FT ADLT (ELECTROSURGICAL) ×2
ELECTRODE REM PT RTRN 9FT ADLT (ELECTROSURGICAL) ×1 IMPLANT
EVACUATOR 1/8 PVC DRAIN (DRAIN) ×2 IMPLANT
FACESHIELD LNG OPTICON STERILE (SAFETY) ×10 IMPLANT
GAUZE SPONGE 2X2 8PLY STRL LF (GAUZE/BANDAGES/DRESSINGS) ×1 IMPLANT
GLOVE BIOGEL PI IND STRL 7.5 (GLOVE) ×1 IMPLANT
GLOVE BIOGEL PI IND STRL 8 (GLOVE) ×2 IMPLANT
GLOVE BIOGEL PI INDICATOR 7.5 (GLOVE) ×1
GLOVE BIOGEL PI INDICATOR 8 (GLOVE) ×2
GLOVE SURG ORTHO 8.0 STRL STRW (GLOVE) ×2 IMPLANT
GLOVE SURG ORTHO 9.0 STRL STRW (GLOVE) ×2 IMPLANT
GLOVE SURG SS PI 7.5 STRL IVOR (GLOVE) ×2 IMPLANT
GOWN STRL REIN XL XLG (GOWN DISPOSABLE) ×2 IMPLANT
HANDPIECE INTERPULSE COAX TIP (DISPOSABLE) ×2
IMMOBILIZER KNEE 20 (SOFTGOODS)
IMMOBILIZER KNEE 20 THIGH 36 (SOFTGOODS) IMPLANT
KIT BASIN OR (CUSTOM PROCEDURE TRAY) ×2 IMPLANT
NDL SAFETY ECLIPSE 18X1.5 (NEEDLE) ×1 IMPLANT
NEEDLE HYPO 18GX1.5 SHARP (NEEDLE) ×2
NS IRRIG 1000ML POUR BTL (IV SOLUTION) ×2 IMPLANT
PACK TOTAL JOINT (CUSTOM PROCEDURE TRAY) ×2 IMPLANT
PAD CAST 4YDX4 CTTN HI CHSV (CAST SUPPLIES) IMPLANT
PADDING CAST COTTON 4X4 STRL (CAST SUPPLIES) ×4
POSITIONER SURGICAL ARM (MISCELLANEOUS) ×2 IMPLANT
SET HNDPC FAN SPRY TIP SCT (DISPOSABLE) ×1 IMPLANT
SET PAD KNEE POSITIONER (MISCELLANEOUS) ×2 IMPLANT
SPONGE GAUZE 2X2 STER 10/PKG (GAUZE/BANDAGES/DRESSINGS) ×1
SPONGE LAP 18X18 X RAY DECT (DISPOSABLE) IMPLANT
SPONGE SURGIFOAM ABS GEL 100 (HEMOSTASIS) IMPLANT
STOCKINETTE 6  STRL (DRAPES) ×1
STOCKINETTE 6 STRL (DRAPES) ×1 IMPLANT
STRIP CLOSURE SKIN 1/2X4 (GAUZE/BANDAGES/DRESSINGS) ×1 IMPLANT
SUCTION FRAZIER 12FR DISP (SUCTIONS) ×2 IMPLANT
SUT BONE WAX W31G (SUTURE) IMPLANT
SUT MNCRL AB 3-0 PS2 18 (SUTURE) ×2 IMPLANT
SUT VIC AB 1 CT1 27 (SUTURE) ×8
SUT VIC AB 1 CT1 27XBRD ANTBC (SUTURE) ×4 IMPLANT
SUT VIC AB 2-0 CT1 27 (SUTURE) ×4
SUT VIC AB 2-0 CT1 TAPERPNT 27 (SUTURE) ×2 IMPLANT
SUT VLOC 180 0 24IN GS25 (SUTURE) ×2 IMPLANT
SYR 50ML LL SCALE MARK (SYRINGE) ×2 IMPLANT
TAPE STRIPS DRAPE STRL (GAUZE/BANDAGES/DRESSINGS) ×2 IMPLANT
TOWEL OR 17X26 10 PK STRL BLUE (TOWEL DISPOSABLE) ×6 IMPLANT
TOWER CARTRIDGE SMART MIX (DISPOSABLE) ×2 IMPLANT
TRAY FOLEY CATH 14FRSI W/METER (CATHETERS) ×2 IMPLANT
WATER STERILE IRR 1500ML POUR (IV SOLUTION) ×2 IMPLANT
WRAP KNEE MAXI GEL POST OP (GAUZE/BANDAGES/DRESSINGS) ×2 IMPLANT

## 2013-06-04 NOTE — Op Note (Signed)
DATE OF SURGERY:  06/04/2013  TIME: 4:01 PM  PATIENT NAME:  Jeffrey [name]    AGE: 57 y.o.   PRE-OPERATIVE DIAGNOSIS:  RIGHT KNEE OA  POST-OPERATIVE DIAGNOSIS:  RIGHT KNEE OA  PROCEDURE:  Procedure(s): TOTAL KNEE ARTHROPLASTY  SURGEON:  Mariza Bourget ANDREW  ASSISTANT:  Bryson Stilwell, PA-C, present and scrubbed throughout the case, critical for assistance with exposure, retraction, instrumentation, and closure.  OPERATIVE IMPLANTS: Depuy PFC Sigma Rotating Platform.  Femur size 4, Tibia size 4, Patella size 38 3-peg oval button, with a 12.5 mm polyethylene insert.   PREOPERATIVE INDICATIONS:   [name] is a 57 y.o. year old male with end stage bone on bone arthritis of the knee who failed conservative treatment and elected for Total Knee Arthroplasty.   The risks, benefits, and alternatives were discussed at length including but not limited to the risks of infection, bleeding, nerve injury, stiffness, blood clots, the need for revision surgery, cardiopulmonary complications, among others, and they were willing to proceed.  OPERATIVE DESCRIPTION:  The patient was brought to the operative room and placed in a supine position.  Spinal anesthesia was administered.  IV antibiotics were given.  The lower extremity was prepped and draped in the usual sterile fashion.  Time out was performed.  The leg was elevated and exsanguinated and the tourniquet was inflated.  Anterior quadriceps tendon splitting approach was performed.  The patella was retracted and osteophytes were removed.  The anterior horn of the medial and lateral meniscus was removed and cruciate ligaments resected.   The distal femur was opened with the drill and the intramedullary distal femoral cutting jig was utilized, set at 5 degrees resecting 10 mm off the distal femur.  Care was taken to protect the collateral ligaments.  The distal femoral sizing jig was applied, taking care to avoid notching.  Then the 4-in-1  cutting jig was applied and the anterior and posterior femur was cut, along with the chamfer cuts.    Then the extramedullary tibial cutting jig was utilized making the appropriate cut using the anterior tibial crest as a reference building in appropriate posterior slope.  Care was taken during the cut to protect the medial and collateral ligaments.  The proximal tibia was removed along with the posterior horns of the menisci.   The posterior medial femoral osteophytes and posterior lateral femoral osteophytes were removed.    The flexion gap was then measured and was symmetric with the extension gap, measured at 12.  I completed the distal femoral preparation using the appropriate jig to prepare the box.  The patella was then measured, and cut with the saw.    The proximal tibia sized and prepared accordingly with the reamer and the punch, and then all components were trialed with the trial insert.  The knee was found to have excellent balance and full motion.    The above named components were then cemented into place and all excess cement was removed.  The trial polyethylene component was in place during cementation, and then was exchanged for the real polyethylene component.    The knee was easily taken through a range of motion and the patella tracked well and the knee irrigated copiously and the parapatellar and subcutaneous tissue closed with vicryl, and monocryl with steri strips for the skin.  The arthrotomy was closed at 90 of flexion. The wounds were dressed with sterile gauze and the tourniquet released and the patient was awakened and returned to the PACU in  stable and satisfactory condition.  There were no complications.  Total tourniquet time was 110 minutes.  Vlock capsule closure. 60cc experal ,marcaine,toradol periosteal injection.

## 2013-06-04 NOTE — Interval H&P Note (Signed)
History and Physical Interval Note:  06/04/2013 1:14 PM  Jeffrey [name]  has presented today for surgery, with the diagnosis of RIGHT KNEE OA  The various methods of treatment have been discussed with the patient and family. After consideration of risks, benefits and other options for treatment, the patient has consented to  Procedure(s): TOTAL KNEE ARTHROPLASTY (Right) as a surgical intervention .  The patient's history has been reviewed, patient examined, no change in status, stable for surgery.  I have reviewed the patient's chart and labs.  Questions were answered to the patient's satisfaction.     Flem Enderle ANDREW

## 2013-06-04 NOTE — Anesthesia Postprocedure Evaluation (Signed)
  Anesthesia Post-op Note  Patient: Jeffrey [name]  Procedure(s) Performed: Procedure(s) (LRB): TOTAL KNEE ARTHROPLASTY (Right)  Patient Location: PACU  Anesthesia Type: Spinal  Level of Consciousness: awake and alert   Airway and Oxygen Therapy: Patient Spontanous Breathing  Post-op Pain: mild  Post-op Assessment: Post-op Vital signs reviewed, Patient's Cardiovascular Status Stable, Respiratory Function Stable, Patent Airway and No signs of Nausea or vomiting  Last Vitals:  Filed Vitals:   06/04/13 1645  BP: 115/63  Pulse: 64  Temp:   Resp: 10    Post-op Vital Signs: stable   Complications: No apparent anesthesia complications

## 2013-06-04 NOTE — Anesthesia Preprocedure Evaluation (Signed)
Anesthesia Evaluation  Patient identified by MRN, date of birth, ID band Patient awake    Reviewed: Allergy & Precautions, H&P , NPO status , Patient's Chart, lab work & pertinent test results, reviewed documented beta blocker date and time   Airway Mallampati: II TM Distance: >3 FB Neck ROM: full    Dental no notable dental hx.    Pulmonary neg pulmonary ROS,  Asbestosis - no problems breath sounds clear to auscultation  Pulmonary exam normal       Cardiovascular Exercise Tolerance: Good hypertension, Pt. on medications Rhythm:regular Rate:Normal     Neuro/Psych negative neurological ROS  negative psych ROS   GI/Hepatic negative GI ROS, Neg liver ROS,   Endo/Other  negative endocrine ROS  Renal/GU negative Renal ROS  negative genitourinary   Musculoskeletal   Abdominal   Peds  Hematology negative hematology ROS (+)   Anesthesia Other Findings   Reproductive/Obstetrics negative OB ROS                           Anesthesia Physical Anesthesia Plan  ASA: II  Anesthesia Plan: Spinal   Post-op Pain Management:    Induction:   Airway Management Planned: Simple Face Mask  Additional Equipment:   Intra-op Plan:   Post-operative Plan:   Informed Consent: I have reviewed the patients History and Physical, chart, labs and discussed the procedure including the risks, benefits and alternatives for the proposed anesthesia with the patient or authorized representative who has indicated his/her understanding and acceptance.   Dental Advisory Given  Plan Discussed with: CRNA and Surgeon  Anesthesia Plan Comments:         Anesthesia Quick Evaluation

## 2013-06-04 NOTE — Anesthesia Procedure Notes (Signed)
Spinal  Patient location during procedure: OR Start time: 06/04/2013 1:23 PM End time: 06/04/2013 1:25 PM Staffing CRNA/Resident: Carmelia Roller R Performed by: resident/CRNA  Spinal Block Patient position: sitting Prep: Betadine Patient monitoring: heart rate, continuous pulse ox and blood pressure Approach: midline Location: L3-4 Injection technique: single-shot Needle Needle type: Whitacre  Needle gauge: 25 G Needle length: 9 cm Needle insertion depth: 6 cm Assessment Sensory level: T6

## 2013-06-04 NOTE — H&P (Signed)
TOTAL KNEE ADMISSION H&P  Patient is being admitted for right total knee arthroplasty.  Subjective:  Chief Complaint:right knee pain.  HPI: Jeffrey [name], 57 y.o. male, has a history of pain and functional disability in the right knee due to arthritis and has failed non-surgical conservative treatments for greater than 12 weeks to includeNSAID's and/or analgesics, corticosteriod injections, viscosupplementation injections, weight reduction as appropriate and activity modification.  Onset of symptoms was gradual, starting 2 years ago with gradually worsening course since that time. The patient noted prior procedures on the knee to include  arthroscopy on the right knee(s).  Patient currently rates pain in the right knee(s) at 6 out of 10 with activity. Patient has night pain, worsening of pain with activity and weight bearing, pain that interferes with activities of daily living, pain with passive range of motion and joint swelling.  Patient has evidence of subchondral cysts, subchondral sclerosis and joint space narrowing by imaging studies. This patient has had Osteoarthritis. There is no active infection.  Patient Active Problem List   Diagnosis Date Noted  . CAP (community acquired pneumonia) 05/17/2013  . Fever, unspecified 05/17/2013  . Cough 05/17/2013   Past Medical History  Diagnosis Date  . Knee pain     RIHGT KNEE OA AND PAIN  . Hypertension   . Cough 05/17/13    PRODUCTIVE COUGH, YELLOW PHLEGM, FEVER - SAW DR. Modesto Charon AND GIVEN LEVOFLOXACIN - AND WILL FOLLOW UP WITH DR. Modesto Charon ON 9/8  . Arthritis   . Pulmonary asbestosis     MILD - PER PT - "DOESN'T SEEM TO BE CAUSING ANY PROBLEMS"  PT IS FOLLOWED BY SPECIALIST IN [place] EVERY YEAR WITH BREATHING TEST AND CT SCANS    Past Surgical History  Procedure Laterality Date  . Neck surgery  ? 1988    CERVICAL FOR HNP  . Wrist fracture surgery Right 2010  . Hernia repair       2 SEPARATE SURGERIES FOR RIGHT AND FOR LEFT INGUINAL HERNIA  REPAIR    Prescriptions prior to admission  Medication Sig Dispense Refill  . albuterol (PROVENTIL HFA;VENTOLIN HFA) 108 (90 BASE) MCG/ACT inhaler Inhale 2 puffs into the lungs every 6 (six) hours as needed for wheezing.  1 Inhaler  2  . amLODipine (NORVASC) 5 MG tablet Take 5 mg by mouth every morning.      Marland Kitchen levofloxacin (LEVAQUIN) 750 MG tablet Take 1 tablet (750 mg total) by mouth daily.  7 tablet  0  . lisinopril (PRINIVIL,ZESTRIL) 10 MG tablet Take 10 mg by mouth every morning.      . meloxicam (MOBIC) 15 MG tablet Take 15 mg by mouth daily.       Allergies  Allergen Reactions  . Sulfonamide Derivatives     REACTION: rash    History  Substance Use Topics  . Smoking status: Never Smoker   . Smokeless tobacco: Never Used  . Alcohol Use: Yes     Comment: OCCAS ALCOHOL - MAYBE 2 DRINKS A MONTH    Family History  Problem Relation Age of Onset  . Hypertension Father   . Cancer Paternal Grandmother     colon     Review of Systems  Constitutional: Negative.   HENT: Negative.   Eyes: Negative.   Respiratory: Positive for cough.        Asbestosis in 1996  Cardiovascular:       HTN  Controlled with medications  Gastrointestinal: Negative.   Genitourinary:  Kidney stones 2013  Musculoskeletal: Positive for joint pain (Right knee).       Right wrist fracture hx.  Skin: Negative.   Neurological: Negative.   Endo/Heme/Allergies: Negative.   Psychiatric/Behavioral: Negative.     Objective:  Physical Exam  Constitutional: He is oriented to person, place, and time. He appears well-developed and well-nourished.  HENT:  Head: Normocephalic.  Eyes: EOM are normal.  Neck: Normal range of motion. Neck supple.  Cardiovascular: Normal rate, regular rhythm, normal heart sounds and intact distal pulses.   Respiratory: Effort normal and breath sounds normal.  GI: Soft. Bowel sounds are normal.  Genitourinary:  Deffered  Musculoskeletal: He exhibits edema and tenderness.   Right knee symptoms as noted. Calf bilateral soft and non-tender.  Neurological: He is alert and oriented to person, place, and time. He has normal reflexes.  Skin: Skin is warm and dry.  Psychiatric: His behavior is normal.    Vital signs in last 24 hours: Temp:  [98 F (36.7 C)] 98 F (36.7 C) (09/12 1015) Pulse Rate:  [72] 72 (09/12 1015) Resp:  [18] 18 (09/12 1015) BP: (132)/(86) 132/86 mmHg (09/12 1015) SpO2:  [100 %] 100 % (09/12 1015)  Labs:   Estimated body mass index is 29.44 kg/(m^2) as calculated from the following:   Height as of 05/21/13: 5\' 10"  (1.778 m).   Weight as of 03/29/13: 93.078 kg (205 lb 3.2 oz).   Imaging Review Plain radiographs demonstrate moderate degenerative joint disease of the right knee(s). The overall alignment ismild varus. The bone quality appears to be good for age and reported activity level.  Assessment/Plan:  End stage arthritis, right knee   The patient history, physical examination, clinical judgment of the provider and imaging studies are consistent with end stage degenerative joint disease of the right knee(s) and total knee arthroplasty is deemed medically necessary. The treatment options including medical management, injection therapy arthroscopy and arthroplasty were discussed at length. The risks and benefits of total knee arthroplasty were presented and reviewed. The risks due to aseptic loosening, infection, stiffness, patella tracking problems, thromboembolic complications and other imponderables were discussed. The patient acknowledged the explanation, agreed to proceed with the plan and consent was signed. Patient is being admitted for inpatient treatment for surgery, pain control, PT, OT, prophylactic antibiotics, VTE prophylaxis, progressive ambulation and ADL's and discharge planning. The patient is planning to be discharged home with home health services

## 2013-06-04 NOTE — Transfer of Care (Signed)
Immediate Anesthesia Transfer of Care Note  Patient: Jeffrey [name]  Procedure(s) Performed: Procedure(s): TOTAL KNEE ARTHROPLASTY (Right)  Patient Location: PACU  Anesthesia Type:MAC and Spinal  Level of Consciousness: awake, sedated, patient cooperative and responds to stimulation  Airway & Oxygen Therapy: Patient Spontanous Breathing and Patient connected to nasal cannula oxygen  Post-op Assessment: Report given to PACU RN and Post -op Vital signs reviewed and stable  Post vital signs: Reviewed and stable  Complications: No apparent anesthesia complications

## 2013-06-05 LAB — COMPREHENSIVE METABOLIC PANEL
AST: 17 U/L (ref 0–37)
Albumin: 3.2 g/dL — ABNORMAL LOW (ref 3.5–5.2)
BUN: 18 mg/dL (ref 6–23)
Calcium: 8.9 mg/dL (ref 8.4–10.5)
Creatinine, Ser: 0.98 mg/dL (ref 0.50–1.35)
Total Protein: 5.6 g/dL — ABNORMAL LOW (ref 6.0–8.3)

## 2013-06-05 MED ORDER — OXYCODONE HCL 5 MG PO TABS
5.0000 mg | ORAL_TABLET | ORAL | Status: DC | PRN
  Administered 2013-06-05 – 2013-06-06 (×6): 10 mg via ORAL
  Administered 2013-06-07 (×2): 5 mg via ORAL
  Administered 2013-06-07: 10 mg via ORAL
  Filled 2013-06-05 (×2): qty 1
  Filled 2013-06-05 (×7): qty 2

## 2013-06-05 MED ORDER — METHOCARBAMOL 500 MG PO TABS
750.0000 mg | ORAL_TABLET | Freq: Four times a day (QID) | ORAL | Status: DC | PRN
  Administered 2013-06-06 – 2013-06-07 (×5): 750 mg via ORAL
  Filled 2013-06-05 (×6): qty 2

## 2013-06-05 MED ORDER — OXYCODONE-ACETAMINOPHEN 5-325 MG PO TABS
1.0000 | ORAL_TABLET | ORAL | Status: DC | PRN
  Administered 2013-06-06 (×5): 2 via ORAL
  Administered 2013-06-07 (×2): 1 via ORAL
  Administered 2013-06-07: 2 via ORAL
  Filled 2013-06-05 (×3): qty 2
  Filled 2013-06-05: qty 1
  Filled 2013-06-05 (×3): qty 2
  Filled 2013-06-05: qty 1

## 2013-06-05 NOTE — Progress Notes (Signed)
Physical Therapy Treatment Patient Details Name: Jeffrey [name] MRN: [id number] DOB: 12/11/1955 Today's Date: 06/05/2013 Time: 4098-1191 PT Time Calculation (min): 19 min  PT Assessment / Plan / Recommendation  History of Present Illness     PT Comments     Follow Up Recommendations  Home health PT     Does the patient have the potential to tolerate intense rehabilitation     Barriers to Discharge        Equipment Recommendations  Rolling walker with 5" wheels    Recommendations for Other Services OT consult  Frequency 7X/week   Progress towards PT Goals Progress towards PT goals: Progressing toward goals  Plan Current plan remains appropriate    Precautions / Restrictions Precautions Precautions: Knee;Fall Required Braces or Orthoses: Knee Immobilizer - Right Knee Immobilizer - Right: Discontinue once straight leg raise with < 10 degree lag Restrictions Weight Bearing Restrictions: No Other Position/Activity Restrictions: WBAT   Pertinent Vitals/Pain 9/10; Premed, RN aware and providing additional meds, cold packs provided    Mobility  Bed Mobility Bed Mobility: Sit to Supine Sit to Supine: 4: Min assist Details for Bed Mobility Assistance: min cues for sequence  Transfers Transfers: Sit to Stand;Stand to Sit Sit to Stand: 4: Min assist;With upper extremity assist;From chair/3-in-1 Stand to Sit: 4: Min assist;With upper extremity assist;To chair/3-in-1 Details for Transfer Assistance: cues for LE management and use of UEs to self assist Ambulation/Gait Ambulation/Gait Assistance: 4: Min assist Ambulation Distance (Feet): 158 Feet Assistive device: Rolling walker Ambulation/Gait Assistance Details: cues for posture and position from RW Gait Pattern: Step-to pattern;Decreased step length - right;Decreased step length - left;Step-through pattern;Trunk flexed Stairs: No    Exercises     PT Diagnosis:    PT Problem List:   PT Treatment Interventions:     PT  Goals (current goals can now be found in the care plan section) Acute Rehab PT Goals Patient Stated Goal: Resume previous active lifestyle with decreased pain; cycling PT Goal Formulation: With patient Time For Goal Achievement: 06/17/13 Potential to Achieve Goals: Good  Visit Information  Last PT Received On: 06/05/13 Assistance Needed: +1    Subjective Data  Patient Stated Goal: Resume previous active lifestyle with decreased pain; cycling   Cognition  Cognition Arousal/Alertness: Awake/alert Behavior During Therapy: WFL for tasks assessed/performed Overall Cognitive Status: Within Functional Limits for tasks assessed    Balance     End of Session PT - End of Session Equipment Utilized During Treatment: Gait belt Activity Tolerance: Patient tolerated treatment well Patient left: in bed;with call bell/phone within reach Nurse Communication: Mobility status   GP     Jeffrey [name] 06/05/2013, 4:53 PM

## 2013-06-05 NOTE — Evaluation (Signed)
Occupational Therapy Evaluation Patient Details Name: Jeffrey [name] MRN: [id number] DOB: 12/18/1955 Today's Date: 06/05/2013 Time: 1013-1050 OT Time Calculation (min): 37 min  OT Assessment / Plan / Recommendation History of present illness Pt is s/p R TKR   Clinical Impression   Pt overall at min assist level with ADL. Will benefit from skilled OT services to maximize independence and safety with functional transfers and ADL for return home with family assist.     OT Assessment  Patient needs continued OT Services    Follow Up Recommendations  No OT follow up;Supervision/Assistance - 24 hour    Barriers to Discharge      Equipment Recommendations  None recommended by OT    Recommendations for Other Services    Frequency  Min 2X/week    Precautions / Restrictions Precautions Precautions: Knee;Fall Knee Immobilizer - Right:  (Pt able to perform SLR) Restrictions Weight Bearing Restrictions: No   Pertinent Vitals/Pain 3/10; reposition, ice    ADL  Eating/Feeding: Simulated;Independent Where Assessed - Eating/Feeding: Chair Grooming: Simulated;Wash/dry hands;Set up Where Assessed - Grooming: Supported sitting Upper Body Bathing: Simulated;Chest;Right arm;Left arm;Abdomen;Set up Where Assessed - Upper Body Bathing: Unsupported sitting Lower Body Bathing: Simulated;Minimal assistance Where Assessed - Lower Body Bathing: Supported sit to stand Upper Body Dressing: Simulated;Set up Where Assessed - Upper Body Dressing: Unsupported sitting Lower Body Dressing: Simulated;Minimal assistance Where Assessed - Lower Body Dressing: Supported sit to stand Toilet Transfer: Performed;Minimal Web designer: Raised toilet seat with arms (or 3-in-1 over toilet) Toileting - Clothing Manipulation and Hygiene: Simulated;Minimal assistance Where Assessed - Engineer, mining and Hygiene: Sit to stand from 3-in-1 or toilet Equipment Used: Rolling  walker ADL Comments: Pt's daughter present and states she will be helping at d/c. Discussed 3in1 versus his higher toilet at home however he does not have a vanity next to commode to push up with. He states he can borrow a 3in1 likely from a family member if needed. Discussed use of 3in1 as shower chair initially also. O2 sats fluctuating between 80% and 100% on RA. Nursing called to room. Tried pt on dynamap and sats were 98% on RA. ? may be pulse ox in room or finger sensor not reading well. Pt 98% each time checked on dynamap with activity. Nursing aware.     OT Diagnosis: Generalized weakness  OT Problem List: Decreased strength;Decreased knowledge of use of DME or AE OT Treatment Interventions: Self-care/ADL training;DME and/or AE instruction;Therapeutic activities;Patient/family education   OT Goals(Current goals can be found in the care plan section) Acute Rehab OT Goals Patient Stated Goal: Resume previous active lifestyle with decreased pain; cycling OT Goal Formulation: With patient/family Time For Goal Achievement: 06/12/13 Potential to Achieve Goals: Good  Visit Information  Last OT Received On: 06/05/13 Assistance Needed: +1 History of Present Illness: Pt is s/p R TKR       Prior Functioning     Home Living Family/patient expects to be discharged to:: Private residence Living Arrangements: Alone Available Help at Discharge: Family Type of Home: House Home Access: Stairs to enter Secretary/administrator of Steps: 4 Entrance Stairs-Rails: Can reach both;Left;Right Home Layout: One level Home Equipment: Crutches Prior Function Level of Independence: Independent Communication Communication: No difficulties         Vision/Perception     Cognition  Cognition Arousal/Alertness: Awake/alert Behavior During Therapy: WFL for tasks assessed/performed Overall Cognitive Status: Within Functional Limits for tasks assessed    Extremity/Trunk Assessment Upper Extremity  Assessment Upper Extremity  Assessment: Overall WFL for tasks assessed     Mobility Transfers Transfers: Sit to Stand;Stand to Sit Sit to Stand: 4: Min assist;With upper extremity assist;From chair/3-in-1 Stand to Sit: 4: Min assist;With upper extremity assist;To chair/3-in-1 Details for Transfer Assistance: cues for LE management and use of UEs to self assist     Exercise     Balance Balance Balance Assessed: Yes Dynamic Standing Balance Dynamic Standing - Level of Assistance: 4: Min assist   End of Session OT - End of Session Activity Tolerance: Patient tolerated treatment well Patient left: in chair;with call bell/phone within reach;with family/visitor present  GO     Lennox Laity [phone] 06/05/2013, 12:55 PM

## 2013-06-05 NOTE — Progress Notes (Signed)
Orthopedics Progress Note  Subjective: I feel better today.  I was sick last night.  Objective:  Filed Vitals:   06/05/13 1006  BP: 114/76  Pulse: 72  Temp: 97.5 F (36.4 C)  Resp: 16    General: Awake and alert  Musculoskeletal: right knee dressing clean, dry, intact.  Drain pulled. Neurovascularly intact  Lab Results  Component Value Date   WBC 6.8 05/21/2013   HGB 15.0 05/21/2013   HCT 43.5 05/21/2013   MCV 84.1 05/21/2013   PLT 208 05/21/2013       Component Value Date/Time   NA 136 06/05/2013 0411   K 3.8 06/05/2013 0411   CL 102 06/05/2013 0411   CO2 26 06/05/2013 0411   GLUCOSE 116* 06/05/2013 0411   BUN 18 06/05/2013 0411   CREATININE 0.98 06/05/2013 0411   CREATININE 1.16 03/29/2013 1052   CALCIUM 8.9 06/05/2013 0411   GFRNONAA 90* 06/05/2013 0411   GFRAA >90 06/05/2013 0411    Lab Results  Component Value Date   INR 1.01 05/21/2013   INR 1.0 06/22/2008   INR 1.0 06/22/2008    Assessment/Plan: POD #1 s/p Procedure(s): TOTAL KNEE ARTHROPLASTY D/c foley OOB with PT  DVT prophylaxis, mechanical and chemical.  Almedia Balls. Ranell Patrick, MD 06/05/2013 11:15 AM

## 2013-06-05 NOTE — Evaluation (Signed)
Physical Therapy Evaluation Patient Details Name: Jeffrey [name] MRN: [id number] DOB: [birth year] Today's Date: 06/05/2013 Time: 4098-1191 PT Time Calculation (min): 35 min  PT Assessment / Plan / Recommendation History of Present Illness     Clinical Impression  Pt s/p R TKR presents with decreased R LE strength/ROM and post op pain limiting functional mobility.  Pt should progress well to d/c home with family assist and HHPT follow up.    PT Assessment       Follow Up Recommendations  Home health PT    Does the patient have the potential to tolerate intense rehabilitation      Barriers to Discharge        Equipment Recommendations  Rolling walker with 5" wheels    Recommendations for Other Services     Frequency      Precautions / Restrictions Precautions Precautions: Knee;Fall Required Braces or Orthoses: Knee Immobilizer - Right Knee Immobilizer - Right: Discontinue once straight leg raise with < 10 degree lag (Pt performed IND SLR this am) Restrictions Weight Bearing Restrictions: No   Pertinent Vitals/Pain 6/10; Meds requested; ice packs provided      Mobility  Bed Mobility Bed Mobility: Supine to Sit Supine to Sit: 4: Min assist Details for Bed Mobility Assistance: min cues for sequence  Transfers Transfers: Sit to Stand;Stand to Sit Sit to Stand: 4: Min assist Stand to Sit: 4: Min guard Details for Transfer Assistance: cues for LE management and use of UEs to self assist Ambulation/Gait Ambulation/Gait Assistance: 3: Mod assist Ambulation Distance (Feet): 68 Feet Assistive device: Rolling walker Ambulation/Gait Assistance Details: cues for initial sequence, stride length, position from RW and posture Gait Pattern: Step-to pattern;Decreased step length - right;Decreased step length - left;Antalgic Stairs: No    Exercises Total Joint Exercises Ankle Circles/Pumps: AROM;Both;10 reps;Supine Quad Sets: AROM;10 reps;Both;Supine Heel Slides: AAROM;10  reps;Supine;Right Straight Leg Raises: AAROM;AROM;Right;15 reps;Supine   PT Diagnosis:    PT Problem List:   PT Treatment Interventions:       PT Goals(Current goals can be found in the care plan section) Acute Rehab PT Goals Patient Stated Goal: Resume previous active lifestyle with decreased pain PT Goal Formulation: With patient Time For Goal Achievement: 06/17/13 Potential to Achieve Goals: Good  Visit Information  Last PT Received On: 06/05/13 Assistance Needed: +1       Prior Functioning  Home Living Family/patient expects to be discharged to:: Private residence Living Arrangements: Alone Available Help at Discharge: Family Type of Home: House Home Access: Stairs to enter Secretary/administrator of Steps: 4 Entrance Stairs-Rails: Can reach both;Left;Right Home Layout: One level Home Equipment: Crutches Prior Function Level of Independence: Independent Communication Communication: No difficulties    Cognition  Cognition Arousal/Alertness: Awake/alert Behavior During Therapy: WFL for tasks assessed/performed Overall Cognitive Status: Within Functional Limits for tasks assessed    Extremity/Trunk Assessment Upper Extremity Assessment Upper Extremity Assessment: Overall WFL for tasks assessed Lower Extremity Assessment Lower Extremity Assessment: RLE deficits/detail RLE Deficits / Details: 3/5 quads with AAROM at knee -10 - 60   Balance    End of Session PT - End of Session Equipment Utilized During Treatment: Gait belt Activity Tolerance: Patient tolerated treatment well Patient left: in chair;with call bell/phone within reach Nurse Communication: Mobility status  GP     Jeffrey [name] 06/05/2013, 12:32 PM

## 2013-06-06 LAB — COMPREHENSIVE METABOLIC PANEL
ALT: 10 U/L (ref 0–53)
Albumin: 2.8 g/dL — ABNORMAL LOW (ref 3.5–5.2)
Alkaline Phosphatase: 50 U/L (ref 39–117)
BUN: 16 mg/dL (ref 6–23)
Chloride: 99 mEq/L (ref 96–112)
GFR calc Af Amer: 84 mL/min — ABNORMAL LOW (ref >90)
Glucose, Bld: 110 mg/dL — ABNORMAL HIGH (ref 70–99)
Potassium: 3.7 mEq/L (ref 3.5–5.1)
Sodium: 132 mEq/L — ABNORMAL LOW (ref 135–145)
Total Bilirubin: 0.7 mg/dL (ref 0.3–1.2)
Total Protein: 5.5 g/dL — ABNORMAL LOW (ref 6.0–8.3)

## 2013-06-06 NOTE — Progress Notes (Signed)
Occupational Therapy Treatment Patient Details Name: Jeffrey [name] MRN: [id number] DOB: 06/10/1956 Today's Date: 06/06/2013 Time: 4098-1191 OT Time Calculation (min): 27 min  OT Assessment / Plan / Recommendation  History of present illness Pt is s/p R TKR--WBAT   OT comments  Pt making progress.  Follow Up Recommendations  No OT follow up;Supervision - Intermittent       Equipment Recommendations  3 in 1 bedside comode (prefers elongated one)       Frequency Min 2X/week   Progress towards OT Goals Progress towards OT goals: Progressing toward goals  Plan Discharge plan remains appropriate    Precautions / Restrictions Precautions Precautions: Knee;Fall Required Braces or Orthoses: Knee Immobilizer - Right Knee Immobilizer - Right: Discontinue once straight leg raise with < 10 degree lag (was able to do slr in bed) Restrictions Other Position/Activity Restrictions: WBAT   Pertinent Vitals/Pain 7/10 in bed; 10/10 with ambulation--pre-medicated; ice applied once back in bed    ADL  Toilet Transfer: Min guard Toilet Transfer Method: Sit to Barista: Raised toilet seat with arms (or 3-in-1 over toilet) Transfers/Ambulation Related to ADLs: min guard A with RW for all ADL Comments: Pt states someone can help him with his LBD prn until he can do it by himself.  He is unsure of borrowing a 3n1 and wants to make sure he gets one from here. Demonstrated to him how he would back up to the shower stall and step over the threshold with his operated leg to then sit down on the 3n1 in the shower and then to step out of the shower stall with his operated leg first--he verablized understanding.      OT Goals(current goals can now be found in the care plan section)    Visit Information  Last OT Received On: 06/06/13 Assistance Needed: +1 History of Present Illness: Pt is s/p R TKR--WBAT       Prior Functioning  Home Living Family/patient expects to be  discharged to:: Private residence Living Arrangements: Alone Available Help at Discharge: Family;Available PRN/intermittently Type of Home: Mobile home Home Access: Stairs to enter Entrance Stairs-Number of Steps: 4 Entrance Stairs-Rails: Can reach both;Left;Right Home Layout: One level Home Equipment: Crutches Prior Function Level of Independence: Independent Communication Communication: No difficulties Dominant Hand: Right    Cognition  Cognition Arousal/Alertness: Awake/alert Behavior During Therapy: WFL for tasks assessed/performed Overall Cognitive Status: Within Functional Limits for tasks assessed    Mobility  Bed Mobility Bed Mobility: Supine to Sit;Sitting - Scoot to Edge of Bed Supine to Sit: 5: Supervision;HOB elevated Sitting - Scoot to Edge of Bed: 7: Independent Transfers Transfers: Sit to Stand;Stand to Sit Sit to Stand: 4: Min guard;With upper extremity assist;From bed Stand to Sit: 4: Min guard;With upper extremity assist;To chair/3-in-1          End of Session OT - End of Session Equipment Utilized During Treatment: Gait belt;Rolling walker Activity Tolerance: Patient tolerated treatment well Patient left: in bed;with call bell/phone within reach       Evette Georges [phone] 06/06/2013, 9:12 AM

## 2013-06-06 NOTE — Plan of Care (Signed)
Problem: Phase III Progression Outcomes Goal: Anticoagulant follow-up in place Outcome: Not Applicable Date Met:  06/06/13 lovenox

## 2013-06-06 NOTE — Progress Notes (Signed)
Physical Therapy Treatment Patient Details Name: Jeffrey [name] MRN: [id number] DOB: Nov 16, 1955 Today's Date: 06/06/2013 Time: 1204-1232 PT Time Calculation (min): 28 min  PT Assessment / Plan / Recommendation  History of Present Illness     PT Comments     Follow Up Recommendations  Home health PT     Does the patient have the potential to tolerate intense rehabilitation     Barriers to Discharge        Equipment Recommendations  None recommended by PT;Other (comment) (Pt states no equipment needs - sister supplying all equipmen)    Recommendations for Other Services OT consult  Frequency 7X/week   Progress towards PT Goals Progress towards PT goals: Progressing toward goals  Plan Current plan remains appropriate    Precautions / Restrictions Precautions Precautions: Knee;Fall Required Braces or Orthoses: Knee Immobilizer - Right Knee Immobilizer - Right: Discontinue once straight leg raise with < 10 degree lag Restrictions Weight Bearing Restrictions: No Other Position/Activity Restrictions: WBAT   Pertinent Vitals/Pain 8/10; premed, RN aware, ice packs provided    Mobility  Bed Mobility Bed Mobility: Supine to Sit;Sit to Supine Supine to Sit: 4: Min guard Sit to Supine: 4: Min assist Details for Bed Mobility Assistance: min cues for sequence  Transfers Transfers: Sit to Stand;Stand to Sit Sit to Stand: 4: Min guard;With upper extremity assist;From bed Stand to Sit: 4: Min guard;With upper extremity assist;To bed Details for Transfer Assistance: cues for LE management and use of UEs to self assist Ambulation/Gait Ambulation/Gait Assistance: 4: Min guard Ambulation Distance (Feet): 68 Feet Assistive device: Rolling walker Ambulation/Gait Assistance Details: min cues for posture, stride length and position from RW Gait Pattern: Step-to pattern;Decreased step length - right;Decreased step length - left;Antalgic Stairs: No    Exercises Total Joint Exercises Ankle  Circles/Pumps: AROM;Both;Supine;15 reps Quad Sets: AROM;10 reps;Both;Supine Heel Slides: AAROM;10 reps;Supine;Right Straight Leg Raises: AAROM;Right;Supine;10 reps Goniometric ROM: AAROM at knee -10 - 40 - pain limited   PT Diagnosis:    PT Problem List:   PT Treatment Interventions:     PT Goals (current goals can now be found in the care plan section) Acute Rehab PT Goals Patient Stated Goal: Resume previous active lifestyle with decreased pain; cycling PT Goal Formulation: With patient Time For Goal Achievement: 06/17/13 Potential to Achieve Goals: Good  Visit Information  Last PT Received On: 06/06/13 Assistance Needed: +1    Subjective Data  Patient Stated Goal: Resume previous active lifestyle with decreased pain; cycling   Cognition  Cognition Arousal/Alertness: Awake/alert Behavior During Therapy: WFL for tasks assessed/performed Overall Cognitive Status: Within Functional Limits for tasks assessed    Balance     End of Session PT - End of Session Equipment Utilized During Treatment: Gait belt Activity Tolerance: Patient tolerated treatment well Patient left: in bed;with call bell/phone within reach Nurse Communication: Mobility status   GP     Jeffrey [name] 06/06/2013, 4:42 PM

## 2013-06-06 NOTE — Progress Notes (Signed)
Pt c/o pain level 9 and rising after only two hours of receiving Percocet 10/325mg  2 tabs po Q 4 hours and Robaxin 750 mg po Q 6 hours. Ice has no effect. C/O pain in upper thigh at what appears to be site of Tourniquet. Will make day shift RN aware to alert MD prior to Discharge.

## 2013-06-06 NOTE — Progress Notes (Signed)
   Subjective: 2 Days Post-Op Procedure(s) (LRB): TOTAL KNEE ARTHROPLASTY (Right) Patient reports pain as moderate.   Plan is to go Home after hospital stay.  Objective: Vital signs in last 24 hours: Temp:  [97.5 F (36.4 C)-99.5 F (37.5 C)] 98.5 F (36.9 C) (09/14 0500) Pulse Rate:  [72-87] 80 (09/14 0500) Resp:  [16-20] 20 (09/14 0500) BP: (114-129)/(74-82) 127/74 mmHg (09/14 0500) SpO2:  [97 %-98 %] 97 % (09/14 0500)  Intake/Output from previous day:  Intake/Output Summary (Last 24 hours) at 06/06/13 0809 Last data filed at 06/06/13 0500  Gross per 24 hour  Intake   1435 ml  Output   1130 ml  Net    305 ml    Intake/Output this shift:    Labs: No results found for this basename: HGB,  in the last 72 hours No results found for this basename: WBC, RBC, HCT, PLT,  in the last 72 hours  Recent Labs  06/05/13 0411 06/06/13 0441  NA 136 132*  K 3.8 3.7  CL 102 99  CO2 26 27  BUN 18 16  CREATININE 0.98 1.10  GLUCOSE 116* 110*  CALCIUM 8.9 8.4   No results found for this basename: LABPT, INR,  in the last 72 hours  EXAM General - Patient is Alert, Appropriate and Oriented Extremity - Neurologically intact Neurovascular intact Incision: dressing C/D/I No cellulitis present Compartment soft Dressing/Incision - clean, dry, no drainage Motor Function - intact, moving foot and toes well on exam.   Past Medical History  Diagnosis Date  . Knee pain     RIHGT KNEE OA AND PAIN  . Hypertension   . Cough 05/17/13    PRODUCTIVE COUGH, YELLOW PHLEGM, FEVER - SAW DR. Modesto Charon AND GIVEN LEVOFLOXACIN - AND WILL FOLLOW UP WITH DR. Modesto Charon ON 9/8  . Arthritis   . Pulmonary asbestosis     MILD - PER PT - "DOESN'T SEEM TO BE CAUSING ANY PROBLEMS"  PT IS FOLLOWED BY SPECIALIST IN [place] EVERY YEAR WITH BREATHING TEST AND CT SCANS    Assessment/Plan: 2 Days Post-Op Procedure(s) (LRB): TOTAL KNEE ARTHROPLASTY (Right) Active Problems:   * No active hospital problems.  *   Advance diet Up with therapy D/C IV fluids Plan for discharge tomorrow  Weight-Bearing as tolerated to right leg  Noriah Osgood V 06/06/2013, 8:09 AM

## 2013-06-07 ENCOUNTER — Encounter (HOSPITAL_COMMUNITY): Payer: Self-pay | Admitting: Specialist

## 2013-06-07 LAB — COMPREHENSIVE METABOLIC PANEL
ALT: 6 U/L (ref 0–53)
CO2: 27 mEq/L (ref 19–32)
Calcium: 8.7 mg/dL (ref 8.4–10.5)
Creatinine, Ser: 1.04 mg/dL (ref 0.50–1.35)
GFR calc Af Amer: >90 mL/min (ref >90)
GFR calc non Af Amer: 78 mL/min — ABNORMAL LOW (ref >90)
Glucose, Bld: 122 mg/dL — ABNORMAL HIGH (ref 70–99)
Sodium: 133 mEq/L — ABNORMAL LOW (ref 135–145)
Total Protein: 5.6 g/dL — ABNORMAL LOW (ref 6.0–8.3)

## 2013-06-07 LAB — CBC
HCT: 31.5 % — ABNORMAL LOW (ref 39.0–52.0)
Hemoglobin: 11 g/dL — ABNORMAL LOW (ref 13.0–17.0)
MCHC: 34.9 g/dL (ref 30.0–36.0)
RBC: 3.73 MIL/uL — ABNORMAL LOW (ref 4.22–5.81)

## 2013-06-07 MED ORDER — METHOCARBAMOL 750 MG PO TABS: 750.0000 mg | ORAL_TABLET | Freq: Four times a day (QID) | ORAL | Status: DC | PRN

## 2013-06-07 MED ORDER — FERROUS SULFATE 325 (65 FE) MG PO TABS: 325.0000 mg | ORAL_TABLET | Freq: Three times a day (TID) | ORAL | Status: DC

## 2013-06-07 MED ORDER — ASPIRIN EC 325 MG PO TBEC: 325.0000 mg | DELAYED_RELEASE_TABLET | Freq: Two times a day (BID) | ORAL | Status: DC

## 2013-06-07 MED ORDER — OXYCODONE-ACETAMINOPHEN 5-325 MG PO TABS: 1.0000 | ORAL_TABLET | ORAL | Status: DC | PRN

## 2013-06-07 NOTE — Progress Notes (Signed)
OT Cancellation Note  Patient Details Name: Jeffrey [name] MRN: [id number] DOB: Jul 28, 1956   Cancelled Treatment:    Reason Eval/Treat Not Completed: Other (comment) (pt states he doesnt need to practice any ADL further) States he took a shower this am. Declines need to practice shower transfer technique even with the ledge he has at home (no ledge here). He states he understands technique. Declines need to practice any other ADL.  Lennox Laity [phone] 06/07/2013, 10:22 AM

## 2013-06-07 NOTE — Progress Notes (Signed)
Physical Therapy Treatment Patient Details Name: Jeffrey [name] MRN: [id number] DOB: [birth year] Today's Date: 06/07/2013 Time: 4098-1191 PT Time Calculation (min): 33 min  PT Assessment / Plan / Recommendation  History of Present Illness     PT Comments   Pt tolerance continues ltd 2* pain  Follow Up Recommendations  Home health PT     Does the patient have the potential to tolerate intense rehabilitation     Barriers to Discharge        Equipment Recommendations  None recommended by PT;Other (comment)    Recommendations for Other Services OT consult  Frequency 7X/week   Progress towards PT Goals Progress towards PT goals: Progressing toward goals  Plan Current plan remains appropriate    Precautions / Restrictions Precautions Precautions: Knee;Fall Required Braces or Orthoses: Knee Immobilizer - Right Knee Immobilizer - Right: Discontinue once straight leg raise with < 10 degree lag Restrictions Weight Bearing Restrictions: No Other Position/Activity Restrictions: WBAT   Pertinent Vitals/Pain 8/10; premed, cold packs provided    Mobility  Bed Mobility Bed Mobility: Supine to Sit;Sit to Supine Supine to Sit: 5: Supervision Sitting - Scoot to Edge of Bed: 5: Supervision Sit to Supine: 5: Supervision Details for Bed Mobility Assistance: min cues for sequence; pt self assisting R LE with UEs Transfers Transfers: Sit to Stand;Stand to Sit Sit to Stand: 5: Supervision Stand to Sit: 5: Supervision Details for Transfer Assistance: cues for LE management and use of UEs to self assist Ambulation/Gait Ambulation/Gait Assistance: 4: Min guard;5: Supervision Ambulation Distance (Feet): 70 Feet Assistive device: Rolling walker Ambulation/Gait Assistance Details: min cues for posture and position from RW Gait Pattern: Step-to pattern;Decreased step length - right;Decreased step length - left;Antalgic General Gait Details: pt continues ltd by pain Stairs: Yes Stairs  Assistance: 4: Min assist Stairs Assistance Details (indicate cue type and reason): cues for sequence and foot/crutch placement Stair Management Technique: One rail Right;Two rails;Step to pattern;Forwards;With crutches Number of Stairs: 4 (2 stairs with Bil rails and 2 stairs with rail and crutch)    Exercises Total Joint Exercises Ankle Circles/Pumps: AROM;Both;Supine;15 reps Quad Sets: AROM;Both;Supine;15 reps Heel Slides: AAROM;Supine;Right;15 reps Straight Leg Raises: AAROM;Right;Supine;20 reps;AROM   PT Diagnosis:    PT Problem List:   PT Treatment Interventions:     PT Goals (current goals can now be found in the care plan section) Acute Rehab PT Goals Patient Stated Goal: Resume previous active lifestyle with decreased pain; cycling PT Goal Formulation: With patient Time For Goal Achievement: 06/17/13 Potential to Achieve Goals: Good  Visit Information  Last PT Received On: 06/07/13 Assistance Needed: +1    Subjective Data  Patient Stated Goal: Resume previous active lifestyle with decreased pain; cycling   Cognition  Cognition Arousal/Alertness: Awake/alert Behavior During Therapy: WFL for tasks assessed/performed Overall Cognitive Status: Within Functional Limits for tasks assessed    Balance     End of Session PT - End of Session Equipment Utilized During Treatment: Gait belt Activity Tolerance: Patient tolerated treatment well Patient left: in bed;with call bell/phone within reach Nurse Communication: Mobility status   GP     Armando Bukhari 06/07/2013, 11:59 AM

## 2013-06-07 NOTE — Care Management Note (Signed)
    Page 1 of 2   06/07/2013     2:01:23 PM   CARE MANAGEMENT NOTE 06/07/2013  Patient:  Jeffrey [name], Jeffrey [name]   Account Number:  [id number]  Date Initiated:  06/05/2013  Documentation initiated by:  [facility]  Subjective/Objective Assessment:   57 year old male admitted s/p RT TKA.     Action/Plan:   Home health services at d/c.   Anticipated DC Date:  06/08/2013   Anticipated DC Plan:  HOME W HOME HEALTH SERVICES      DC Planning Services  CM consult      [facility] Choice  HOME HEALTH   Choice offered to / List presented to:  C-1 Patient        HH arranged  HH-2 PT      [facility] agency  [facility]   Status of service:  Completed, signed off Medicare Important Message given?  NA - LOS <3 / Initial given by admissions (If response is "NO", the following Medicare IM given date fields will be blank) Date Medicare IM given:   Date Additional Medicare IM given:    Discharge Disposition:  HOME W HOME HEALTH SERVICES  Per UR Regulation:  Reviewed for med. necessity/level of care/duration of stay  If discussed at Long Length of Stay Meetings, dates discussed:    Comments:  06/07/2013 Colleen Can BSN RN CCM [phone] CM spoke with patient and plans are for pt to return to his home in [facility]) where his 2 daughters will be caregivers. Physical address is [address], [place] [address]; contact [phone]. pt wants HH agency that is in network with his insurance. He already has RW, shower chair and crutches. He will need CPM which was ordered by ortho office prior to pt's admission. Pt states CPM company has already called his daughter regarding instructions for delivery of CPM to pt's home. Daughter/caregiver will notify CPM vendor of patient arival to his home.  [facility] notified of request for [facility] agency that is in network and one that comes to his area. Rep advised that they are in network and do cover [place]. Physical  address and contact phone number verified with rep. They will be able to service patient with start of services day after discharge.  Pt &  daughter/caregiver given contact information for [facility]. Anticipate discharge today.

## 2013-06-07 NOTE — Discharge Summary (Signed)
Physician Discharge Summary  Patient ID: Jeffrey [name] MRN: [id number] DOB/AGE: [birth year] 57 y.o.  Admit date: 06/04/2013 Discharge date: 06/07/2013  Admission Diagnoses: Right knee osteoarthritis  Discharge Diagnoses: S/P Right total knee arthroplasty   Discharged Condition: Stable  Hospital Course:  Jeffrey [name] is a 57 y.o. who was admitted to [facility]. They were brought to the operating room on 06/04/2013 and underwent Procedure(s): TOTAL KNEE ARTHROPLASTY.  Patient tolerated the procedure well and was later transferred to the recovery room and then to the orthopaedic floor for postoperative care.  They were given PO and IV analgesics for pain control following their surgery.  They were given 24 hours of postoperative antibiotics of  Anti-infectives   Start     Dose/Rate Route Frequency Ordered Stop   06/04/13 2000  ceFAZolin (ANCEF) IVPB 2 g/50 mL premix     2 g 100 mL/hr over 30 Minutes Intravenous Every 6 hours 06/04/13 1732 06/05/13 0913   06/04/13 1100  ceFAZolin (ANCEF) IVPB 2 g/50 mL premix     2 g 100 mL/hr over 30 Minutes Intravenous On call to O.R. 06/04/13 1019 06/04/13 1331     and started on DVT prophylaxis in the form of Lovenox.   PT and OT were ordered for total joint protocol.  Discharge planning consulted to help with postop disposition and equipment needs.  Patient had a good night on the evening of surgery and started to get up OOB with therapy on day one.  Hemovac drain was pulled without difficulty.  Continued to work with therapy into day two.  Dressing was D/I.  By day three, the patient had progressed with therapy and meeting their goals.  Incision was healing well.  Patient was seen in rounds and was ready to go home.  Consults: N/A  Significant Diagnostic Studies: Routine TKA  Treatments:  Routine TKA  Discharge Exam: Blood pressure 113/71, pulse 76, temperature 98.6 F (37 C), temperature source Oral, resp. rate 16, height 5\' 10"  (1.778 m),  weight 92.897 kg (204 lb 12.8 oz), SpO2 97.00%. Well nourished. Alert and oriented x3. RRR, Lungs clear, BS x4. Abdomen soft and non tender. Right Calf soft and non tender. Right knee dressing D/I. No DVT signs. Compartment soft. No signs of infection.  Right LE neurovascular intact.  Disposition: 01-Home or Self Care  Discharge Orders   Future Orders Complete By Expires   Call MD / Call 911  As directed    Comments:     If you experience chest pain or shortness of breath, CALL 911 and be transported to the hospital emergency room.  If you develope a fever above 101 F, pus (white drainage) or increased drainage or redness at the wound, or calf pain, call your surgeon's office.   Constipation Prevention  As directed    Comments:     Drink plenty of fluids.  Prune juice may be helpful.  You may use a stool softener, such as Colace (over the counter) 100 mg twice a day.  Use MiraLax (over the counter) for constipation as needed.   CPM  As directed    Comments:     Continuous passive motion machine (CPM):      Use the CPM from 0 to 90 for 6-8 hours per day.      You may increase by 10 degree per day.  You may break it up into 2 or 3 sessions per day.      Use CPM for 2  weeks or until you are told to stop.   Diet - low sodium heart healthy  As directed    Discharge instructions  As directed    Comments:     Call and make an appointment for 2 weeks from now ([phone]). Leave dressing in place, may remove the drain site dressing on the side. Place a band aid and neosporin over the site. May shower. Start home PT and CPM as directed. Follow D/C instructions. Take medications as directed. Weight bear as tolerated with assistive devices.   Do not put a pillow under the knee. Place it under the heel.  As directed    Increase activity slowly as tolerated  As directed    Patient may shower  As directed    Comments:     You may shower without a dressing once there is no drainage.  Do not wash over the  wound.  If drainage remains, cover wound with plastic wrap and then shower.   TED hose  As directed    Comments:     Use stockings (TED hose) for 2 weeks on bilateral leg(s).  You may remove them at night for sleeping.       Medication List         albuterol 108 (90 BASE) MCG/ACT inhaler  Commonly known as:  PROVENTIL HFA;VENTOLIN HFA  Inhale 2 puffs into the lungs every 6 (six) hours as needed for wheezing.     amLODipine 5 MG tablet  Commonly known as:  NORVASC  Take 5 mg by mouth every morning.     aspirin EC 325 MG tablet  Take 1 tablet (325 mg total) by mouth 2 (two) times daily.     ferrous sulfate 325 (65 FE) MG tablet  Take 1 tablet (325 mg total) by mouth 3 (three) times daily after meals.     levofloxacin 750 MG tablet  Commonly known as:  LEVAQUIN  Take 1 tablet (750 mg total) by mouth daily.     lisinopril 10 MG tablet  Commonly known as:  PRINIVIL,ZESTRIL  Take 10 mg by mouth every morning.     meloxicam 15 MG tablet  Commonly known as:  MOBIC  Take 15 mg by mouth daily.     methocarbamol 750 MG tablet  Commonly known as:  ROBAXIN  Take 1 tablet (750 mg total) by mouth every 6 (six) hours as needed.     oxyCODONE-acetaminophen 5-325 MG per tablet  Commonly known as:  PERCOCET/ROXICET  Take 1-2 tablets by mouth every 4 (four) hours as needed.         SignedMarkham Jordan 06/07/2013, 2:04 PM

## 2013-06-07 NOTE — Progress Notes (Signed)
Subjective: 3 Days Post-Op Procedure(s) (LRB): TOTAL KNEE ARTHROPLASTY (Right) Patient reports pain as moderate.  Soreness in the thigh and knee region. Contolled with pain medications, which are well tolerated. Tolerating PO's well. Passing flatus, no BM. Doing Well with PT. Pt states "I am ready to go home." Denies CP, SOB, or Calf pain.  Objective: Vital signs in last 24 hours: Temp:  [98.6 F (37 C)] 98.6 F (37 C) (09/15 0605) Pulse Rate:  [76] 76 (09/15 0605) Resp:  [16] 16 (09/15 0605) BP: (113)/(71) 113/71 mmHg (09/15 0605) SpO2:  [97 %] 97 % (09/15 0605)  Intake/Output from previous day: 09/14 0701 - 09/15 0700 In: 17.5 [I.V.:17.5] Out: 2250 [Urine:2250] Intake/Output this shift:    No results found for this basename: HGB,  in the last 72 hours No results found for this basename: WBC, RBC, HCT, PLT,  in the last 72 hours  Recent Labs  06/06/13 0441 06/07/13 0410  NA 132* 133*  K 3.7 3.7  CL 99 100  CO2 27 27  BUN 16 11  CREATININE 1.10 1.04  GLUCOSE 110* 122*  CALCIUM 8.4 8.7   No results found for this basename: LABPT, INR,  in the last 72 hours  Well nourished. Alert and oriented x3. RRR, Lungs clear, BS x4. Abdomen soft and non tender. Right Calf soft and non tender. Right knee dressing D/I. No DVT signs. Compartment soft. Negative Homans sign. No signs of infection.  Right LE neurovascular intact. 2+ pedal pulse.  Assessment/Plan: 3 Days Post-Op Procedure(s) (LRB): TOTAL KNEE ARTHROPLASTY (Right) CBC this am  D/C home today with family support. Start home PT and CPM. Follow D/C instructions and take medications as directed. Follow up in the office in 2 weeks or sooner for worsening or changing of symptoms.   Suliman Termini L 06/07/2013, 7:30 AM

## 2013-06-18 NOTE — H&P (Signed)
TOTAL KNEE ADMISSION H&P  Patient is being admitted for right total knee arthroplasty. I have seen and examined this patient.  Agree with the note above.  Jeffrey [name] 06/18/2013 7:50 AM  Subjective:  Chief Complaint:right knee pain.  HPI: Jeffrey [name], 57 y.o. male, has a history of pain and functional disability in the right knee due to arthritis and has failed non-surgical conservative treatments for greater than 12 weeks to includeNSAID's and/or analgesics, corticosteriod injections, viscosupplementation injections, weight reduction as appropriate and activity modification.  Onset of symptoms was gradual, starting 2 years ago with gradually worsening course since that time. The patient noted prior procedures on the knee to include  arthroscopy on the right knee(s).  Patient currently rates pain in the right knee(s) at 6 out of 10 with activity. Patient has night pain, worsening of pain with activity and weight bearing, pain that interferes with activities of daily living, pain with passive range of motion and joint swelling.  Patient has evidence of subchondral cysts, subchondral sclerosis and joint space narrowing by imaging studies. This patient has had Osteoarthritis. There is no active infection.  Patient Active Problem List   Diagnosis Date Noted  . CAP (community acquired pneumonia) 05/17/2013  . Fever, unspecified 05/17/2013  . Cough 05/17/2013   Past Medical History  Diagnosis Date  . Knee pain     RIHGT KNEE OA AND PAIN  . Hypertension   . Cough 05/17/13    PRODUCTIVE COUGH, YELLOW PHLEGM, FEVER - SAW DR. Modesto Charon AND GIVEN LEVOFLOXACIN - AND WILL FOLLOW UP WITH DR. Modesto Charon ON 9/8  . Arthritis   . Pulmonary asbestosis     MILD - PER PT - "DOESN'T SEEM TO BE CAUSING ANY PROBLEMS"  PT IS FOLLOWED BY SPECIALIST IN [place] EVERY YEAR WITH BREATHING TEST AND CT SCANS    Past Surgical History  Procedure Laterality Date  . Neck surgery  ? 1988    CERVICAL FOR HNP  . Wrist  fracture surgery Right 2010  . Hernia repair       2 SEPARATE SURGERIES FOR RIGHT AND FOR LEFT INGUINAL HERNIA REPAIR  . Total knee arthroplasty Right 06/04/2013    Procedure: TOTAL KNEE ARTHROPLASTY;  Surgeon: Eugenia Mcalpine, MD;  Location: [facility];  Service: Orthopedics;  Laterality: Right;    No prescriptions prior to admission   Allergies  Allergen Reactions  . Sulfonamide Derivatives     REACTION: rash    History  Substance Use Topics  . Smoking status: Never Smoker   . Smokeless tobacco: Never Used  . Alcohol Use: Yes     Comment: OCCAS ALCOHOL - MAYBE 2 DRINKS A MONTH    Family History  Problem Relation Age of Onset  . Hypertension Father   . Cancer Paternal Grandmother     colon     Review of Systems  Constitutional: Negative.   HENT: Negative.   Eyes: Negative.   Respiratory: Positive for cough.        Asbestosis in 1996  Cardiovascular:       HTN  Controlled with medications  Gastrointestinal: Negative.   Genitourinary:       Kidney stones 2013  Musculoskeletal: Positive for joint pain (Right knee).       Right wrist fracture hx.  Skin: Negative.   Neurological: Negative.   Endo/Heme/Allergies: Negative.   Psychiatric/Behavioral: Negative.     Objective:  Physical Exam  Constitutional: He is oriented to person, place, and time. He appears well-developed and  well-nourished.  HENT:  Head: Normocephalic.  Eyes: EOM are normal.  Neck: Normal range of motion. Neck supple.  Cardiovascular: Normal rate, regular rhythm, normal heart sounds and intact distal pulses.   Respiratory: Effort normal and breath sounds normal.  GI: Soft. Bowel sounds are normal.  Genitourinary:  Deffered  Musculoskeletal: He exhibits edema and tenderness.  Right knee symptoms as noted. Calf bilateral soft and non-tender.  Neurological: He is alert and oriented to person, place, and time. He has normal reflexes.  Skin: Skin is warm and dry.  Psychiatric: His behavior is normal.     Vital signs in last 24 hours:    Labs:   Estimated body mass index is 29.39 kg/(m^2) as calculated from the following:   Height as of this encounter: 5\' 10"  (1.778 m).   Weight as of this encounter: 92.897 kg (204 lb 12.8 oz).   Imaging Review Plain radiographs demonstrate moderate degenerative joint disease of the right knee(s). The overall alignment ismild varus. The bone quality appears to be good for age and reported activity level.  Assessment/Plan:  End stage arthritis, right knee   The patient history, physical examination, clinical judgment of the provider and imaging studies are consistent with end stage degenerative joint disease of the right knee(s) and total knee arthroplasty is deemed medically necessary. The treatment options including medical management, injection therapy arthroscopy and arthroplasty were discussed at length. The risks and benefits of total knee arthroplasty were presented and reviewed. The risks due to aseptic loosening, infection, stiffness, patella tracking problems, thromboembolic complications and other imponderables were discussed. The patient acknowledged the explanation, agreed to proceed with the plan and consent was signed. Patient is being admitted for inpatient treatment for surgery, pain control, PT, OT, prophylactic antibiotics, VTE prophylaxis, progressive ambulation and ADL's and discharge planning. The patient is planning to be discharged home with home health services

## 2013-06-29 ENCOUNTER — Ambulatory Visit: Payer: 59 | Attending: Specialist | Admitting: Physical Therapy

## 2013-06-29 DIAGNOSIS — R5381 Other malaise: Secondary | ICD-10-CM | POA: Insufficient documentation

## 2013-06-29 DIAGNOSIS — M25669 Stiffness of unspecified knee, not elsewhere classified: Secondary | ICD-10-CM | POA: Insufficient documentation

## 2013-06-29 DIAGNOSIS — M25569 Pain in unspecified knee: Secondary | ICD-10-CM | POA: Insufficient documentation

## 2013-06-29 DIAGNOSIS — IMO0001 Reserved for inherently not codable concepts without codable children: Secondary | ICD-10-CM | POA: Insufficient documentation

## 2013-06-29 DIAGNOSIS — Z96659 Presence of unspecified artificial knee joint: Secondary | ICD-10-CM | POA: Insufficient documentation

## 2013-06-30 ENCOUNTER — Ambulatory Visit: Payer: 59 | Admitting: Physical Therapy

## 2013-07-02 ENCOUNTER — Ambulatory Visit: Payer: 59 | Admitting: Physical Therapy

## 2013-07-05 ENCOUNTER — Ambulatory Visit: Payer: 59 | Admitting: *Deleted

## 2013-07-07 ENCOUNTER — Ambulatory Visit: Payer: 59 | Admitting: Physical Therapy

## 2013-07-08 ENCOUNTER — Encounter: Payer: Self-pay | Admitting: *Deleted

## 2013-07-13 ENCOUNTER — Ambulatory Visit: Payer: 59 | Admitting: *Deleted

## 2013-07-14 ENCOUNTER — Ambulatory Visit: Payer: 59 | Admitting: Physical Therapy

## 2013-07-22 ENCOUNTER — Encounter: Payer: Self-pay | Admitting: *Deleted

## 2013-07-23 ENCOUNTER — Ambulatory Visit: Payer: 59 | Admitting: *Deleted

## 2013-07-26 ENCOUNTER — Ambulatory Visit: Payer: 59 | Attending: Specialist | Admitting: Physical Therapy

## 2013-07-26 DIAGNOSIS — R5381 Other malaise: Secondary | ICD-10-CM | POA: Insufficient documentation

## 2013-07-26 DIAGNOSIS — M25669 Stiffness of unspecified knee, not elsewhere classified: Secondary | ICD-10-CM | POA: Insufficient documentation

## 2013-07-26 DIAGNOSIS — M25569 Pain in unspecified knee: Secondary | ICD-10-CM | POA: Insufficient documentation

## 2013-07-26 DIAGNOSIS — Z96659 Presence of unspecified artificial knee joint: Secondary | ICD-10-CM | POA: Insufficient documentation

## 2013-07-26 DIAGNOSIS — IMO0001 Reserved for inherently not codable concepts without codable children: Secondary | ICD-10-CM | POA: Insufficient documentation

## 2013-08-03 ENCOUNTER — Ambulatory Visit: Payer: 59 | Admitting: *Deleted

## 2013-08-04 ENCOUNTER — Ambulatory Visit: Payer: 59 | Admitting: Physical Therapy

## 2013-08-10 ENCOUNTER — Encounter: Payer: Self-pay | Admitting: Physical Therapy

## 2013-08-11 ENCOUNTER — Encounter: Payer: Self-pay | Admitting: Physical Therapy

## 2013-09-21 ENCOUNTER — Telehealth: Payer: Self-pay | Admitting: Family Medicine

## 2013-09-21 NOTE — Telephone Encounter (Signed)
SPOKE WITH PT APPT GIVEN FOR 8AM ON 09-22-13 AND ADVISED TO GO TO ER IF SYMPTOMS WORSEN. PT VERBALIZES UNDERSTANDING.

## 2013-09-21 NOTE — Telephone Encounter (Signed)
Patient wants to see dr. Modesto Charon for his calf and foot numb. Offered him to see another provider but he really wanted to see wond

## 2013-09-22 ENCOUNTER — Ambulatory Visit (INDEPENDENT_AMBULATORY_CARE_PROVIDER_SITE_OTHER): Payer: 59 | Admitting: Family Medicine

## 2013-09-22 ENCOUNTER — Encounter: Payer: Self-pay | Admitting: Family Medicine

## 2013-09-22 ENCOUNTER — Ambulatory Visit (INDEPENDENT_AMBULATORY_CARE_PROVIDER_SITE_OTHER): Payer: 59

## 2013-09-22 VITALS — BP 134/85 | HR 92 | Temp 98.5°F | Ht 70.0 in | Wt 204.8 lb

## 2013-09-22 DIAGNOSIS — IMO0002 Reserved for concepts with insufficient information to code with codable children: Secondary | ICD-10-CM

## 2013-09-22 DIAGNOSIS — M5416 Radiculopathy, lumbar region: Secondary | ICD-10-CM | POA: Insufficient documentation

## 2013-09-22 MED ORDER — PREDNISONE 20 MG PO TABS: 60.0000 mg | ORAL_TABLET | Freq: Every day | ORAL | Status: DC

## 2013-09-22 NOTE — Patient Instructions (Signed)
Sciatica °Sciatica is pain, weakness, numbness, or tingling along the path of the sciatic nerve. The nerve starts in the lower back and runs down the back of each leg. The nerve controls the muscles in the lower leg and in the back of the knee, while also providing sensation to the back of the thigh, lower leg, and the sole of your foot. Sciatica is a symptom of another medical condition. For instance, nerve damage or certain conditions, such as a herniated disk or bone spur on the spine, pinch or put pressure on the sciatic nerve. This causes the pain, weakness, or other sensations normally associated with sciatica. Generally, sciatica only affects one side of the body. °CAUSES  °· Herniated or slipped disc. °· Degenerative disk disease. °· A pain disorder involving the narrow muscle in the buttocks (piriformis syndrome). °· Pelvic injury or fracture. °· Pregnancy. °· Tumor (rare). °SYMPTOMS  °Symptoms can vary from mild to very severe. The symptoms usually travel from the low back to the buttocks and down the back of the leg. Symptoms can include: °· Mild tingling or dull aches in the lower back, leg, or hip. °· Numbness in the back of the calf or sole of the foot. °· Burning sensations in the lower back, leg, or hip. °· Sharp pains in the lower back, leg, or hip. °· Leg weakness. °· Severe back pain inhibiting movement. °These symptoms may get worse with coughing, sneezing, laughing, or prolonged sitting or standing. Also, being overweight may worsen symptoms. °DIAGNOSIS  °Your caregiver will perform a physical exam to look for common symptoms of sciatica. He or she may ask you to do certain movements or activities that would trigger sciatic nerve pain. Other tests may be performed to find the cause of the sciatica. These may include: °· Blood tests. °· X-rays. °· Imaging tests, such as an MRI or CT scan. °TREATMENT  °Treatment is directed at the cause of the sciatic pain. Sometimes, treatment is not necessary  and the pain and discomfort goes away on its own. If treatment is needed, your caregiver may suggest: °· Over-the-counter medicines to relieve pain. °· Prescription medicines, such as anti-inflammatory medicine, muscle relaxants, or narcotics. °· Applying heat or ice to the painful area. °· Steroid injections to lessen pain, irritation, and inflammation around the nerve. °· Reducing activity during periods of pain. °· Exercising and stretching to strengthen your abdomen and improve flexibility of your spine. Your caregiver may suggest losing weight if the extra weight makes the back pain worse. °· Physical therapy. °· Surgery to eliminate what is pressing or pinching the nerve, such as a bone spur or part of a herniated disk. °HOME CARE INSTRUCTIONS  °· Only take over-the-counter or prescription medicines for pain or discomfort as directed by your caregiver. °· Apply ice to the affected area for 20 minutes, 3 4 times a day for the first 48 72 hours. Then try heat in the same way. °· Exercise, stretch, or perform your usual activities if these do not aggravate your pain. °· Attend physical therapy sessions as directed by your caregiver. °· Keep all follow-up appointments as directed by your caregiver. °· Do not wear high heels or shoes that do not provide proper support. °· Check your mattress to see if it is too soft. A firm mattress may lessen your pain and discomfort. °SEEK IMMEDIATE MEDICAL CARE IF:  °· You lose control of your bowel or bladder (incontinence). °· You have increasing weakness in the lower back,   pelvis, buttocks, or legs. °· You have redness or swelling of your back. °· You have a burning sensation when you urinate. °· You have pain that gets worse when you lie down or awakens you at night. °· Your pain is worse than you have experienced in the past. °· Your pain is lasting longer than 4 weeks. °· You are suddenly losing weight without reason. °MAKE SURE YOU: °· Understand these  instructions. °· Will watch your condition. °· Will get help right away if you are not doing well or get worse. °Document Released: 09/03/2001 Document Revised: 03/10/2012 Document Reviewed: 01/19/2012 °[employer]® Patient Information ©2014 [employer]. ° °

## 2013-09-22 NOTE — Progress Notes (Signed)
Patient ID: Jeffrey [name], male   DOB: 12/06/1955, 57 y.o.   MRN: [id number] SUBJECTIVE: CC: Chief Complaint  Patient presents with  . Acute Visit    c/o rt calf pain and foot numb after picking up tire to change flat tire. states pain starts rt hip radiating down    HPI: Pulled into a gas station and was helping someone change a flat tire. Twisted to the left to throw the flat tire to the left and felt a sharp pain in the lower back and the right sole of the foot is numb. And the right calf is weak and numb.  Past Medical History  Diagnosis Date  . Knee pain     RIHGT KNEE OA AND PAIN  . Hypertension   . Cough 05/17/13    PRODUCTIVE COUGH, YELLOW PHLEGM, FEVER - SAW DR. Modesto Charon AND GIVEN LEVOFLOXACIN - AND WILL FOLLOW UP WITH DR. Modesto Charon ON 9/8  . Arthritis   . Pulmonary asbestosis     MILD - PER PT - "DOESN'T SEEM TO BE CAUSING ANY PROBLEMS"  PT IS FOLLOWED BY SPECIALIST IN [place] EVERY YEAR WITH BREATHING TEST AND CT SCANS   Past Surgical History  Procedure Laterality Date  . Neck surgery  ? 1988    CERVICAL FOR HNP  . Wrist fracture surgery Right 2010  . Hernia repair       2 SEPARATE SURGERIES FOR RIGHT AND FOR LEFT INGUINAL HERNIA REPAIR  . Total knee arthroplasty Right 06/04/2013    Procedure: TOTAL KNEE ARTHROPLASTY;  Surgeon: Eugenia Mcalpine, MD;  Location: [facility];  Service: Orthopedics;  Laterality: Right;   History   Social History  . Marital Status: Married    Spouse Name: N/A    Number of Children: N/A  . Years of Education: N/A   Occupational History  . Not on file.   Social History Main Topics  . Smoking status: Never Smoker   . Smokeless tobacco: Never Used  . Alcohol Use: Yes     Comment: OCCAS ALCOHOL - MAYBE 2 DRINKS A MONTH  . Drug Use: No  . Sexual Activity: Not on file   Other Topics Concern  . Not on file   Social History Narrative  . No narrative on file   Family History  Problem Relation Age of Onset  . Hypertension Father   . Cancer  Paternal Grandmother     colon   Current Outpatient Prescriptions on File Prior to Visit  Medication Sig Dispense Refill  . amLODipine (NORVASC) 5 MG tablet Take 5 mg by mouth every morning.      Marland Kitchen aspirin EC 325 MG tablet Take 1 tablet (325 mg total) by mouth 2 (two) times daily.  60 tablet  0  . lisinopril (PRINIVIL,ZESTRIL) 10 MG tablet Take 10 mg by mouth every morning.      . meloxicam (MOBIC) 15 MG tablet Take 15 mg by mouth daily.      Marland Kitchen albuterol (PROVENTIL HFA;VENTOLIN HFA) 108 (90 BASE) MCG/ACT inhaler Inhale 2 puffs into the lungs every 6 (six) hours as needed for wheezing.  1 Inhaler  2  . ferrous sulfate 325 (65 FE) MG tablet Take 1 tablet (325 mg total) by mouth 3 (three) times daily after meals.  90 tablet  3  . levofloxacin (LEVAQUIN) 750 MG tablet Take 1 tablet (750 mg total) by mouth daily.  7 tablet  0  . methocarbamol (ROBAXIN) 750 MG tablet Take 1 tablet (750 mg total)  by mouth every 6 (six) hours as needed.  50 tablet  2   No current facility-administered medications on file prior to visit.   Allergies  Allergen Reactions  . Sulfonamide Derivatives     REACTION: rash   Immunization History  Administered Date(s) Administered  . Rabies, IM 02/15/2013, 02/18/2013, 02/22/2013, 03/01/2013  . Rabies, intradermal 02/15/2013  . Tdap 02/15/2013   Prior to Admission medications   Medication Sig Start Date End Date Taking? Authorizing Provider  amLODipine (NORVASC) 5 MG tablet Take 5 mg by mouth every morning.   Yes Historical Provider, MD  aspirin EC 325 MG tablet Take 1 tablet (325 mg total) by mouth 2 (two) times daily. 06/07/13  Yes Bryson L Stilwell, PA-C  lisinopril (PRINIVIL,ZESTRIL) 10 MG tablet Take 10 mg by mouth every morning.   Yes Historical Provider, MD  meloxicam (MOBIC) 15 MG tablet Take 15 mg by mouth daily.   Yes Historical Provider, MD  albuterol (PROVENTIL HFA;VENTOLIN HFA) 108 (90 BASE) MCG/ACT inhaler Inhale 2 puffs into the lungs every 6 (six) hours  as needed for wheezing. 05/25/13   Ileana Ladd, MD  ferrous sulfate 325 (65 FE) MG tablet Take 1 tablet (325 mg total) by mouth 3 (three) times daily after meals. 06/07/13   Markham Jordan, PA-C  HYDROcodone-acetaminophen (NORCO/VICODIN) 5-325 MG per tablet  07/13/13   Historical Provider, MD  levofloxacin (LEVAQUIN) 750 MG tablet Take 1 tablet (750 mg total) by mouth daily. 05/17/13   Ileana Ladd, MD  methocarbamol (ROBAXIN) 750 MG tablet Take 1 tablet (750 mg total) by mouth every 6 (six) hours as needed. 06/07/13   Bryson L Stilwell, PA-C     ROS: As above in the HPI. All other systems are stable or negative.  OBJECTIVE: APPEARANCE:  Patient in no acute distress.The patient appeared well nourished and normally developed. Acyanotic. Waist: VITAL SIGNS:BP 134/85  Pulse 92  Temp(Src) 98.5 F (36.9 C) (Oral)  Ht 5\' 10"  (1.778 m)  Wt 204 lb 12.8 oz (92.897 kg)  BMI 29.39 kg/m2 WM  SKIN: warm and  Dry without overt rashes, tattoos and scars  HEAD and Neck: without JVD, Head and scalp: normal Eyes:No scleral icterus. Fundi normal, eye movements normal. Ears: Auricle normal, canal normal, Tympanic membranes normal, insufflation normal. Nose: normal Throat: normal Neck & thyroid: normal  CHEST & LUNGS: Chest wall: normal Lungs: Clear  CVS: Reveals the PMI to be normally located. Regular rhythm, First and Second Heart sounds are normal,  absence of murmurs, rubs or gallops. Peripheral vasculature: Radial pulses: normal Dorsal pedis pulses: normal Posterior pulses: normal  ABDOMEN:  Appearance: normal Benign, no organomegaly, no masses, no Abdominal Aortic enlargement. No Guarding , no rebound. No Bruits. Bowel sounds: normal  RECTAL: N/A GU: N/A  EXTREMETIES: nonedematous.  MUSCULOSKELETAL:  Spine: reduced ROM  NEUROLOGIC: oriented to time,place and person; nonfocal. Strength is 4 1/2 /5 in the right calf. Numbness sole of the right foot Sensory is  abnormal. Reduced  Sensory the right foot /sole Reflexes are reduced on the right lower extremityl Cranial Nerves are normal.  ASSESSMENT: Lumbar radiculopathy, acute - L5-S1 symptoms - Plan: DG Lumbar Spine Complete, predniSONE (DELTASONE) 20 MG tablet  PLAN:  Orders Placed This Encounter  Procedures  . DG Lumbar Spine Complete    Standing Status: Future     Number of Occurrences: 1     Standing Expiration Date: 11/21/2014    Order Specific Question:  Reason for Exam (SYMPTOM  OR DIAGNOSIS REQUIRED)    Answer:  lumbar radiculopathy    Order Specific Question:  Preferred imaging location?    Answer:  Internal   Meds ordered this encounter  Medications  . HYDROcodone-acetaminophen (NORCO/VICODIN) 5-325 MG per tablet    Sig:   . predniSONE (DELTASONE) 20 MG tablet    Sig: Take 3 tablets (60 mg total) by mouth daily with breakfast. For 2 days; then 2 tabs daily for 2 days, then 1 tab daily for 2 days    Dispense:  12 tablet    Refill:  0   Medications Discontinued During This Encounter  Medication Reason  . oxyCODONE-acetaminophen (PERCOCET/ROXICET) 5-325 MG per tablet Completed Course  [facility] reading (PRIMARY) by  Dr. Elmer Bales on L5 anterolisthesis. L5 -S1 disk space narrowing.                            Back Care discussed: handout in the [facility] on lumbar radiculopathy.  Return in about 2 weeks (around 10/06/2013) for recheck back.Thelma Barge P. Modesto Charon, M.D.

## 2013-10-08 ENCOUNTER — Ambulatory Visit: Payer: 59 | Admitting: Family Medicine

## 2013-10-22 ENCOUNTER — Ambulatory Visit (INDEPENDENT_AMBULATORY_CARE_PROVIDER_SITE_OTHER): Payer: 59 | Admitting: Family Medicine

## 2013-10-22 ENCOUNTER — Encounter: Payer: Self-pay | Admitting: Family Medicine

## 2013-10-22 VITALS — BP 127/84 | HR 79 | Temp 97.7°F | Ht 70.0 in | Wt 209.0 lb

## 2013-10-22 DIAGNOSIS — IMO0002 Reserved for concepts with insufficient information to code with codable children: Secondary | ICD-10-CM

## 2013-10-22 DIAGNOSIS — M5416 Radiculopathy, lumbar region: Secondary | ICD-10-CM

## 2013-10-22 DIAGNOSIS — I10 Essential (primary) hypertension: Secondary | ICD-10-CM

## 2013-10-22 NOTE — Progress Notes (Signed)
Patient ID: Jeffrey [name], male   DOB: January 08, 1956, 59 y.o.   MRN: [id number] SUBJECTIVE: CC: Chief Complaint  Patient presents with  . Follow-up    2 WEEK RECK SCIATIC NERVE  STATES BETTER    HPI: Back is getting better. Just a little numbness remains at the lateral right foot. But it is getting better.  Past Medical History  Diagnosis Date  . Knee pain     RIHGT KNEE OA AND PAIN  . Hypertension   . Cough 05/17/13    PRODUCTIVE COUGH, YELLOW PHLEGM, FEVER - SAW DR. Jacelyn Grip AND GIVEN LEVOFLOXACIN - AND WILL FOLLOW UP WITH DR. Jacelyn Grip ON 9/8  . Arthritis   . Pulmonary asbestosis     MILD - PER PT - "DOESN'T SEEM TO BE CAUSING ANY PROBLEMS"  PT IS FOLLOWED BY SPECIALIST IN [place] EVERY YEAR WITH BREATHING TEST AND CT SCANS   Past Surgical History  Procedure Laterality Date  . Neck surgery  ? 1988    CERVICAL FOR HNP  . Wrist fracture surgery Right 2010  . Hernia repair       2 SEPARATE SURGERIES FOR RIGHT AND FOR LEFT INGUINAL HERNIA REPAIR  . Total knee arthroplasty Right 06/04/2013    Procedure: TOTAL KNEE ARTHROPLASTY;  Surgeon: Sydnee Cabal, MD;  Location: [facility];  Service: Orthopedics;  Laterality: Right;   History   Social History  . Marital Status: Married    Spouse Name: N/A    Number of Children: N/A  . Years of Education: N/A   Occupational History  . Not on file.   Social History Main Topics  . Smoking status: Never Smoker   . Smokeless tobacco: Never Used  . Alcohol Use: Yes     Comment: OCCAS ALCOHOL - MAYBE 2 DRINKS A MONTH  . Drug Use: No  . Sexual Activity: Not on file   Other Topics Concern  . Not on file   Social History Narrative  . No narrative on file   Family History  Problem Relation Age of Onset  . Hypertension Father   . Cancer Paternal Grandmother     colon   Current Outpatient Prescriptions on File Prior to Visit  Medication Sig Dispense Refill  . albuterol (PROVENTIL HFA;VENTOLIN HFA) 108 (90 BASE) MCG/ACT inhaler Inhale 2 puffs into  the lungs every 6 (six) hours as needed for wheezing.  1 Inhaler  2  . amLODipine (NORVASC) 5 MG tablet Take 5 mg by mouth every morning.      Marland Kitchen aspirin EC 325 MG tablet Take 1 tablet (325 mg total) by mouth 2 (two) times daily.  60 tablet  0  . lisinopril (PRINIVIL,ZESTRIL) 10 MG tablet Take 10 mg by mouth every morning.      . meloxicam (MOBIC) 15 MG tablet Take 15 mg by mouth daily.      . ferrous sulfate 325 (65 FE) MG tablet Take 1 tablet (325 mg total) by mouth 3 (three) times daily after meals.  90 tablet  3  . HYDROcodone-acetaminophen (NORCO/VICODIN) 5-325 MG per tablet       . levofloxacin (LEVAQUIN) 750 MG tablet Take 1 tablet (750 mg total) by mouth daily.  7 tablet  0  . methocarbamol (ROBAXIN) 750 MG tablet Take 1 tablet (750 mg total) by mouth every 6 (six) hours as needed.  50 tablet  2  . predniSONE (DELTASONE) 20 MG tablet Take 3 tablets (60 mg total) by mouth daily with breakfast. For 2 days;  then 2 tabs daily for 2 days, then 1 tab daily for 2 days  12 tablet  0   No current facility-administered medications on file prior to visit.   Allergies  Allergen Reactions  . Sulfonamide Derivatives     REACTION: rash   Immunization History  Administered Date(s) Administered  . Rabies, IM 02/15/2013, 02/18/2013, 02/22/2013, 03/01/2013  . Rabies, intradermal 02/15/2013  . Tdap 02/15/2013   Prior to Admission medications   Medication Sig Start Date End Date Taking? Authorizing Provider  albuterol (PROVENTIL HFA;VENTOLIN HFA) 108 (90 BASE) MCG/ACT inhaler Inhale 2 puffs into the lungs every 6 (six) hours as needed for wheezing. 05/25/13  Yes Vernie Shanks, MD  amLODipine (NORVASC) 5 MG tablet Take 5 mg by mouth every morning.   Yes Historical Provider, MD  aspirin EC 325 MG tablet Take 1 tablet (325 mg total) by mouth 2 (two) times daily. 06/07/13  Yes Bryson L Stilwell, PA-C  lisinopril (PRINIVIL,ZESTRIL) 10 MG tablet Take 10 mg by mouth every morning.   Yes Historical Provider, MD   meloxicam (MOBIC) 15 MG tablet Take 15 mg by mouth daily.   Yes Historical Provider, MD  ferrous sulfate 325 (65 FE) MG tablet Take 1 tablet (325 mg total) by mouth 3 (three) times daily after meals. 06/07/13   Lajean Manes, PA-C  HYDROcodone-acetaminophen (NORCO/VICODIN) 5-325 MG per tablet  07/13/13   Historical Provider, MD  levofloxacin (LEVAQUIN) 750 MG tablet Take 1 tablet (750 mg total) by mouth daily. 05/17/13   Vernie Shanks, MD  methocarbamol (ROBAXIN) 750 MG tablet Take 1 tablet (750 mg total) by mouth every 6 (six) hours as needed. 06/07/13   Bryson L Stilwell, PA-C  predniSONE (DELTASONE) 20 MG tablet Take 3 tablets (60 mg total) by mouth daily with breakfast. For 2 days; then 2 tabs daily for 2 days, then 1 tab daily for 2 days 09/22/13   Vernie Shanks, MD     ROS: As above in the HPI. All other systems are stable or negative.  OBJECTIVE: APPEARANCE:  Patient in no acute distress.The patient appeared well nourished and normally developed. Acyanotic. Waist: VITAL SIGNS:BP 127/84  Pulse 79  Temp(Src) 97.7 F (36.5 C) (Oral)  Ht '5\' 10"'  (1.778 m)  Wt 209 lb (94.802 kg)  BMI 29.99 kg/m2 WM  SKIN: warm and  Dry without overt rashes, tattoos and scars  HEAD and Neck: without JVD, Head and scalp: normal Eyes:No scleral icterus. Fundi normal, eye movements normal. Ears: Auricle normal, canal normal, Tympanic membranes normal, insufflation normal. Nose: normal Throat: normal Neck & thyroid: normal  CHEST & LUNGS: Chest wall: normal Lungs: Clear  CVS: Reveals the PMI to be normally located. Regular rhythm, First and Second Heart sounds are normal,  absence of murmurs, rubs or gallops. Peripheral vasculature: Radial pulses: normal Dorsal pedis pulses: normal Posterior pulses: normal  ABDOMEN:  Appearance: normal Benign, no organomegaly, no masses, no Abdominal Aortic enlargement. No Guarding , no rebound. No Bruits. Bowel sounds: normal  RECTAL: N/A GU:  N/A  EXTREMETIES: nonedematous.  MUSCULOSKELETAL:  Spine: normal Joints: intact  NEUROLOGIC: oriented to time,place and person; nonfocal. Strength normal. Reflexes intact Sensory: intact Hopping normal   ASSESSMENT:  Lumbar radiculopathy, acute  HTN (hypertension) - Plan: CMP14+EGFR Improved  PLAN: Continue back care Expectant recovery in another month. DASH Diet. Orders Placed This Encounter  Procedures  . CMP14+EGFR   No orders of the defined types were placed in this encounter.   There  are no discontinued medications. Return in about 3 months (around 01/20/2014) for Recheck medical problems, recheck BP.  Churchill Grimsley P. Jacelyn Grip, M.D.

## 2013-10-23 LAB — CMP14+EGFR
ALT: 15 IU/L (ref 0–44)
AST: 18 IU/L (ref 0–40)
Albumin/Globulin Ratio: 2.2 (ref 1.1–2.5)
Albumin: 4.2 g/dL (ref 3.5–5.5)
Alkaline Phosphatase: 65 IU/L (ref 39–117)
BUN/Creatinine Ratio: 13 (ref 9–20)
BUN: 15 mg/dL (ref 6–24)
CO2: 22 mmol/L (ref 18–29)
Calcium: 9.7 mg/dL (ref 8.7–10.2)
Chloride: 103 mmol/L (ref 97–108)
Creatinine, Ser: 1.13 mg/dL (ref 0.76–1.27)
GFR calc Af Amer: 83 mL/min/{1.73_m2} (ref >59)
GFR calc non Af Amer: 72 mL/min/{1.73_m2} (ref >59)
Globulin, Total: 1.9 g/dL (ref 1.5–4.5)
Glucose: 82 mg/dL (ref 65–99)
Potassium: 4.5 mmol/L (ref 3.5–5.2)
Sodium: 143 mmol/L (ref 134–144)
Total Bilirubin: 0.5 mg/dL (ref 0.0–1.2)
Total Protein: 6.1 g/dL (ref 6.0–8.5)

## 2013-10-23 NOTE — Progress Notes (Signed)
Quick Note:  Call patient. Labs normal. No change in plan. ______ 

## 2013-12-14 ENCOUNTER — Ambulatory Visit (INDEPENDENT_AMBULATORY_CARE_PROVIDER_SITE_OTHER): Payer: 59 | Admitting: Nurse Practitioner

## 2013-12-14 ENCOUNTER — Telehealth: Payer: Self-pay | Admitting: Family Medicine

## 2013-12-14 ENCOUNTER — Encounter: Payer: Self-pay | Admitting: Nurse Practitioner

## 2013-12-14 VITALS — BP 125/85 | HR 99 | Temp 99.0°F | Ht 70.0 in | Wt 211.6 lb

## 2013-12-14 DIAGNOSIS — J309 Allergic rhinitis, unspecified: Secondary | ICD-10-CM

## 2013-12-14 MED ORDER — FLUTICASONE PROPIONATE 50 MCG/ACT NA SUSP
2.0000 | Freq: Every day | NASAL | Status: DC
Start: 2013-12-14 — End: 2015-06-08

## 2013-12-14 NOTE — Progress Notes (Signed)
   Subjective:    Patient ID: Jeffrey [name], male    DOB: April 18, 1956, 58 y.o.   MRN: [id number]  HPI  Patient presents today complaining of sinus issues x 4 days. Symptoms includes headaches, congestion, post-nasal drainage, sinus pressure, cough, sneezing, and rhinorrhea. Denies sore throat, chills, and fever. Treatment tried included Alavert with mild relief.   Review of Systems  Constitutional: Negative for fever and chills.  HENT: Positive for congestion, postnasal drip, rhinorrhea, sinus pressure and sneezing. Negative for ear pain.   Respiratory: Positive for cough. Negative for shortness of breath and wheezing.   Cardiovascular: Negative for chest pain.  All other systems reviewed and are negative.       Objective:   Physical Exam  Constitutional: He is oriented to person, place, and time. He appears well-developed and well-nourished.  HENT:  Head: Normocephalic.  Right Ear: External ear normal.  Left Ear: External ear normal.  Nose: Mucosal edema present. Right sinus exhibits no maxillary sinus tenderness and no frontal sinus tenderness. Left sinus exhibits no maxillary sinus tenderness and no frontal sinus tenderness.  Mouth/Throat: Oropharynx is clear and moist.  Neck: Normal range of motion. Neck supple.  Cardiovascular: Normal rate, regular rhythm and normal heart sounds.   Pulmonary/Chest: Effort normal and breath sounds normal. He has no wheezes. He has no rales.  Lymphadenopathy:    He has no cervical adenopathy.  Neurological: He is alert and oriented to person, place, and time.  Skin: Skin is warm and dry.  Psychiatric: He has a normal mood and affect. His behavior is normal. Judgment and thought content normal.     BP 125/85  Pulse 99  Temp(Src) 99 F (37.2 C) (Oral)  Ht 5\' 10"  (1.778 m)  Wt 211 lb 9.6 oz (95.981 kg)  BMI 30.36 kg/m2        Assessment & Plan:   1. Allergic sinusitis     Meds ordered this encounter  Medications  . fluticasone  (FLONASE) 50 MCG/ACT nasal spray    Sig: Place 2 sprays into both nostrils daily.    Dispense:  16 g    Refill:  6    Order Specific Question:  Supervising Provider    Answer:  Chipper Herb [[id number]]    Zyrtec OTC 1. Take meds as prescribed 2. Use a cool mist humidifier especially during the winter months and when heat has been humid. 3. Use saline nose sprays frequently 4. Saline irrigations of the nose can be very helpful if done frequently.  * 4X daily for 1 week*  * Use of a nettie pot can be helpful with this. Follow directions with this* 5. Drink plenty of fluids 6. Keep thermostat turn down low 7.For any cough or congestion  Use plain Mucinex- regular strength or max strength is fine   * Children- consult with Pharmacist for dosing 8. For fever or aces or pains- take tylenol or ibuprofen appropriate for age and weight.  * for fevers greater than 101 orally you may alternate ibuprofen and tylenol every  3 hours.  RTC if symptoms do not improvement or worsen   Mary-Margaret Hassell Done, FNP

## 2013-12-14 NOTE — Telephone Encounter (Signed)
appt at 2 with mmm 

## 2013-12-14 NOTE — Patient Instructions (Addendum)
Zyrtec OTC  1. Take meds as prescribed 2. Use a cool mist humidifier especially during the winter months and when heat has been humid. 3. Use saline nose sprays or Flonase frequently 4. Saline irrigations of the nose can be very helpful if done frequently.  * 4X daily for 1 week*  * Use of a nettie pot can be helpful with this. Follow directions with this* 5. Drink plenty of fluids 6. Keep thermostat turn down low 7.For any cough or congestion  Use plain Mucinex- regular strength or max strength is fine   * Children- consult with Pharmacist for dosing 8. For fever or aces or pains- take tylenol or ibuprofen appropriate for age and weight.  * for fevers greater than 101 orally you may alternate ibuprofen and tylenol every  3 hours.  Allergic Rhinitis Allergic rhinitis is when the mucous membranes in the nose respond to allergens. Allergens are particles in the air that cause your body to have an allergic reaction. This causes you to release allergic antibodies. Through a chain of events, these eventually cause you to release histamine into the blood stream. Although meant to protect the body, it is this release of histamine that causes your discomfort, such as frequent sneezing, congestion, and an itchy, runny nose.  CAUSES  Seasonal allergic rhinitis (hay fever) is caused by pollen allergens that may come from grasses, trees, and weeds. Year-round allergic rhinitis (perennial allergic rhinitis) is caused by allergens such as house dust mites, pet dander, and mold spores.  SYMPTOMS   Nasal stuffiness (congestion).  Itchy, runny nose with sneezing and tearing of the eyes. DIAGNOSIS  Your health care provider can help you determine the allergen or allergens that trigger your symptoms. If you and your health care provider are unable to determine the allergen, skin or blood testing may be used. TREATMENT  Allergic Rhinitis does not have a cure, but it can be controlled by:  Medicines and  allergy shots (immunotherapy).  Avoiding the allergen. Hay fever may often be treated with antihistamines in pill or nasal spray forms. Antihistamines block the effects of histamine. There are over-the-counter medicines that may help with nasal congestion and swelling around the eyes. Check with your health care provider before taking or giving this medicine.  If avoiding the allergen or the medicine prescribed do not work, there are many new medicines your health care provider can prescribe. Stronger medicine may be used if initial measures are ineffective. Desensitizing injections can be used if medicine and avoidance does not work. Desensitization is when a patient is given ongoing shots until the body becomes less sensitive to the allergen. Make sure you follow up with your health care provider if problems continue. HOME CARE INSTRUCTIONS It is not possible to completely avoid allergens, but you can reduce your symptoms by taking steps to limit your exposure to them. It helps to know exactly what you are allergic to so that you can avoid your specific triggers. SEEK MEDICAL CARE IF:   You have a fever.  You develop a cough that does not stop easily (persistent).  You have shortness of breath.  You start wheezing.  Symptoms interfere with normal daily activities. Document Released: 06/04/2001 Document Revised: 06/30/2013 Document Reviewed: 05/17/2013 [facility] Patient Information 2014 [place].

## 2014-01-21 ENCOUNTER — Encounter: Payer: Self-pay | Admitting: Family Medicine

## 2014-01-21 ENCOUNTER — Ambulatory Visit (INDEPENDENT_AMBULATORY_CARE_PROVIDER_SITE_OTHER): Payer: 59 | Admitting: Family Medicine

## 2014-01-21 VITALS — BP 133/86 | HR 62 | Temp 99.1°F | Wt 210.2 lb

## 2014-01-21 DIAGNOSIS — I10 Essential (primary) hypertension: Secondary | ICD-10-CM

## 2014-01-21 DIAGNOSIS — IMO0002 Reserved for concepts with insufficient information to code with codable children: Secondary | ICD-10-CM

## 2014-01-21 DIAGNOSIS — M5416 Radiculopathy, lumbar region: Secondary | ICD-10-CM

## 2014-01-21 DIAGNOSIS — J61 Pneumoconiosis due to asbestos and other mineral fibers: Secondary | ICD-10-CM | POA: Insufficient documentation

## 2014-01-21 MED ORDER — AMLODIPINE BESYLATE 5 MG PO TABS: 5.0000 mg | ORAL_TABLET | Freq: Every morning | ORAL | Status: DC

## 2014-01-21 MED ORDER — LISINOPRIL 10 MG PO TABS: 10.0000 mg | ORAL_TABLET | Freq: Every morning | ORAL | Status: DC

## 2014-01-21 NOTE — Progress Notes (Signed)
Patient ID: Jeffrey [name], male   DOB: 1956-08-16, 58 y.o.   MRN: [id number] SUBJECTIVE: CC: Chief Complaint  Patient presents with  . Follow-up    HPI: Patient is here for follow up of hypertension/ and lumbar radiculopathy: denies Headache;deniesChest Pain;denies weakness;denies Shortness of Breath or Orthopnea;denies Visual changes;denies palpitations;denies cough;denies pedal edema;denies symptoms of TIA or stroke; admits to Compliance with medications. denies Problems with medications.  Numbness on the lateral right foot is better , just a subtle numbness  Past Medical History  Diagnosis Date  . Knee pain     RIHGT KNEE OA AND PAIN  . Cough 05/17/13    PRODUCTIVE COUGH, YELLOW PHLEGM, FEVER - SAW DR. Jacelyn Grip AND GIVEN LEVOFLOXACIN - AND WILL FOLLOW UP WITH DR. Jacelyn Grip ON 9/8  . Arthritis   . Pulmonary asbestosis     MILD - PER PT - "DOESN'T SEEM TO BE CAUSING ANY PROBLEMS"  PT IS FOLLOWED BY SPECIALIST IN [place] EVERY YEAR WITH BREATHING TEST AND CT SCANS  . Hypertension    Past Surgical History  Procedure Laterality Date  . Neck surgery  ? 1988    CERVICAL FOR HNP  . Wrist fracture surgery Right 2010  . Hernia repair       2 SEPARATE SURGERIES FOR RIGHT AND FOR LEFT INGUINAL HERNIA REPAIR  . Total knee arthroplasty Right 06/04/2013    Procedure: TOTAL KNEE ARTHROPLASTY;  Surgeon: Sydnee Cabal, MD;  Location: [facility];  Service: Orthopedics;  Laterality: Right;   History   Social History  . Marital Status: Married    Spouse Name: N/A    Number of Children: N/A  . Years of Education: N/A   Occupational History  . Not on file.   Social History Main Topics  . Smoking status: Never Smoker   . Smokeless tobacco: Never Used  . Alcohol Use: Yes     Comment: OCCAS ALCOHOL - MAYBE 2 DRINKS A MONTH  . Drug Use: No  . Sexual Activity: Not on file   Other Topics Concern  . Not on file   Social History Narrative  . No narrative on file   Family History  Problem Relation  Age of Onset  . Hypertension Father   . Cancer Paternal Grandmother     colon   Current Outpatient Prescriptions on File Prior to Visit  Medication Sig Dispense Refill  . aspirin EC 325 MG tablet Take 1 tablet (325 mg total) by mouth 2 (two) times daily.  60 tablet  0  . fluticasone (FLONASE) 50 MCG/ACT nasal spray Place 2 sprays into both nostrils daily.  16 g  6  . meloxicam (MOBIC) 15 MG tablet Take 15 mg by mouth daily.      Marland Kitchen albuterol (PROVENTIL HFA;VENTOLIN HFA) 108 (90 BASE) MCG/ACT inhaler Inhale 2 puffs into the lungs every 6 (six) hours as needed for wheezing.  1 Inhaler  2   No current facility-administered medications on file prior to visit.   Allergies  Allergen Reactions  . Sulfonamide Derivatives     REACTION: rash   Immunization History  Administered Date(s) Administered  . Rabies, IM 02/15/2013, 02/18/2013, 02/22/2013, 03/01/2013  . Rabies, intradermal 02/15/2013  . Tdap 02/15/2013   Prior to Admission medications   Medication Sig Start Date End Date Taking? Authorizing Provider  amLODipine (NORVASC) 5 MG tablet Take 5 mg by mouth every morning.   Yes Historical Provider, MD  aspirin EC 325 MG tablet Take 1 tablet (325 mg total) by  mouth 2 (two) times daily. 06/07/13  Yes Bryson L Stilwell, PA-C  fluticasone (FLONASE) 50 MCG/ACT nasal spray Place 2 sprays into both nostrils daily. 12/14/13  Yes Mary-Margaret Hassell Done, FNP  lisinopril (PRINIVIL,ZESTRIL) 10 MG tablet Take 10 mg by mouth every morning.   Yes Historical Provider, MD  meloxicam (MOBIC) 15 MG tablet Take 15 mg by mouth daily.   Yes Historical Provider, MD  albuterol (PROVENTIL HFA;VENTOLIN HFA) 108 (90 BASE) MCG/ACT inhaler Inhale 2 puffs into the lungs every 6 (six) hours as needed for wheezing. 05/25/13   Vernie Shanks, MD  ferrous sulfate 325 (65 FE) MG tablet Take 1 tablet (325 mg total) by mouth 3 (three) times daily after meals. 06/07/13   Lajean Manes, PA-C  HYDROcodone-acetaminophen  (NORCO/VICODIN) 5-325 MG per tablet  07/13/13   Historical Provider, MD     ROS: As above in the HPI. All other systems are stable or negative.  OBJECTIVE: APPEARANCE:  Patient in no acute distress.The patient appeared well nourished and normally developed. Acyanotic. Waist: VITAL SIGNS:BP 133/86  Pulse 62  Temp(Src) 99.1 F (37.3 C) (Oral)  Wt 210 lb 3.2 oz (95.346 kg)  WM  SKIN: warm and  Dry without overt rashes, tattoos and scars  HEAD and Neck: without JVD, Head and scalp: normal Eyes:No scleral icterus. Fundi normal, eye movements normal. Ears: Auricle normal, canal normal, Tympanic membranes normal, insufflation normal. Nose: normal Throat: normal Neck & thyroid: normal  CHEST & LUNGS: Chest wall: normal Lungs: Clear  CVS: Reveals the PMI to be normally located. Regular rhythm, First and Second Heart sounds are normal,  absence of murmurs, rubs or gallops. Peripheral vasculature: Radial pulses: normal Dorsal pedis pulses: normal Posterior pulses: normal  ABDOMEN:  Appearance: normal Benign, no organomegaly, no masses, no Abdominal Aortic enlargement. No Guarding , no rebound. No Bruits. Bowel sounds: normal  RECTAL: N/A GU: N/A  EXTREMETIES: nonedematous.  MUSCULOSKELETAL:  Spine:reduced ROM Joints: intact SLR negative.  NEUROLOGIC: oriented to time,place and person; nonfocal. Strength is normal Sensory is normal. Monofilament test normal. Reflexes are normal Cranial Nerves are normal.  ASSESSMENT:  Lumbar radiculopathy, acute  Hypertension - Plan: CMP14+EGFR, amLODipine (NORVASC) 5 MG tablet, lisinopril (PRINIVIL,ZESTRIL) 10 MG tablet  PLAN: Dash diet in the AVS.  Orders Placed This Encounter  Procedures  . CMP14+EGFR   Meds ordered this encounter  Medications  . amLODipine (NORVASC) 5 MG tablet    Sig: Take 1 tablet (5 mg total) by mouth every morning.    Dispense:  90 tablet    Refill:  1  . lisinopril (PRINIVIL,ZESTRIL) 10  MG tablet    Sig: Take 1 tablet (10 mg total) by mouth every morning.    Dispense:  90 tablet    Refill:  1   Medications Discontinued During This Encounter  Medication Reason  . amLODipine (NORVASC) 5 MG tablet Reorder  . lisinopril (PRINIVIL,ZESTRIL) 10 MG tablet Reorder  . ferrous sulfate 325 (65 FE) MG tablet Completed Course  . HYDROcodone-acetaminophen (NORCO/VICODIN) 5-325 MG per tablet Completed Course   Return in about 4 months (around 05/24/2014) for Recheck medical problems.  Chablis Losh P. Jacelyn Grip, M.D.

## 2014-01-21 NOTE — Patient Instructions (Signed)
DASH Diet  The DASH diet stands for "Dietary Approaches to Stop Hypertension." It is a healthy eating plan that has been shown to reduce high blood pressure (hypertension) in as little as 14 days, while also possibly providing other significant health benefits. These other health benefits include reducing the risk of breast cancer after menopause and reducing the risk of type 2 diabetes, heart disease, colon cancer, and stroke. Health benefits also include weight loss and slowing kidney failure in patients with chronic kidney disease.   DIET GUIDELINES  · Limit salt (sodium). Your diet should contain less than 1500 mg of sodium daily.  · Limit refined or processed carbohydrates. Your diet should include mostly whole grains. Desserts and added sugars should be used sparingly.  · Include small amounts of heart-healthy fats. These types of fats include nuts, oils, and tub margarine. Limit saturated and trans fats. These fats have been shown to be harmful in the body.  CHOOSING FOODS   The following food groups are based on a 2000 calorie diet. See your Registered Dietitian for individual calorie needs.  Grains and Grain Products (6 to 8 servings daily)  · Eat More Often: Whole-wheat bread, brown rice, whole-grain or wheat pasta, quinoa, popcorn without added fat or salt (air popped).  · Eat Less Often: White bread, white pasta, white rice, cornbread.  Vegetables (4 to 5 servings daily)  · Eat More Often: Fresh, frozen, and canned vegetables. Vegetables may be raw, steamed, roasted, or grilled with a minimal amount of fat.  · Eat Less Often/Avoid: Creamed or fried vegetables. Vegetables in a cheese sauce.  Fruit (4 to 5 servings daily)  · Eat More Often: All fresh, canned (in natural juice), or frozen fruits. Dried fruits without added sugar. One hundred percent fruit juice (½ cup [237 mL] daily).  · Eat Less Often: Dried fruits with added sugar. Canned fruit in light or heavy syrup.  Lean Meats, Fish, and Poultry (2  servings or less daily. One serving is 3 to 4 oz [85-114 g]).  · Eat More Often: Ninety percent or leaner ground beef, tenderloin, sirloin. Round cuts of beef, chicken breast, turkey breast. All fish. Grill, bake, or broil your meat. Nothing should be fried.  · Eat Less Often/Avoid: Fatty cuts of meat, turkey, or chicken leg, thigh, or wing. Fried cuts of meat or fish.  Dairy (2 to 3 servings)  · Eat More Often: Low-fat or fat-free milk, low-fat plain or light yogurt, reduced-fat or part-skim cheese.  · Eat Less Often/Avoid: Milk (whole, 2%). Whole milk yogurt. Full-fat cheeses.  Nuts, Seeds, and Legumes (4 to 5 servings per week)  · Eat More Often: All without added salt.  · Eat Less Often/Avoid: Salted nuts and seeds, canned beans with added salt.  Fats and Sweets (limited)  · Eat More Often: Vegetable oils, tub margarines without trans fats, sugar-free gelatin. Mayonnaise and salad dressings.  · Eat Less Often/Avoid: Coconut oils, palm oils, butter, stick margarine, cream, half and half, cookies, candy, pie.  FOR MORE INFORMATION  The Dash Diet Eating Plan: www.dashdiet.org  Document Released: 08/29/2011 Document Revised: 12/02/2011 Document Reviewed: 08/29/2011  [employer]® Patient Information ©2014 [employer].

## 2014-01-22 LAB — CMP14+EGFR
ALT: 15 IU/L (ref 0–44)
AST: 16 IU/L (ref 0–40)
Albumin/Globulin Ratio: 2.1 (ref 1.1–2.5)
Albumin: 4.5 g/dL (ref 3.5–5.5)
Alkaline Phosphatase: 74 IU/L (ref 39–117)
BUN/Creatinine Ratio: 20 (ref 9–20)
BUN: 23 mg/dL (ref 6–24)
CO2: 22 mmol/L (ref 18–29)
Calcium: 9.8 mg/dL (ref 8.7–10.2)
Chloride: 103 mmol/L (ref 97–108)
Creatinine, Ser: 1.17 mg/dL (ref 0.76–1.27)
GFR calc Af Amer: 79 mL/min/{1.73_m2} (ref >59)
GFR calc non Af Amer: 68 mL/min/{1.73_m2} (ref >59)
Globulin, Total: 2.1 g/dL (ref 1.5–4.5)
Glucose: 77 mg/dL (ref 65–99)
Potassium: 4.4 mmol/L (ref 3.5–5.2)
Sodium: 142 mmol/L (ref 134–144)
Total Bilirubin: 0.4 mg/dL (ref 0.0–1.2)
Total Protein: 6.6 g/dL (ref 6.0–8.5)

## 2014-01-23 NOTE — Progress Notes (Signed)
Quick Note:  Call patient. Labs normal. No change in plan. ______ 

## 2014-01-24 ENCOUNTER — Telehealth: Payer: Self-pay | Admitting: Family Medicine

## 2014-01-24 NOTE — Telephone Encounter (Signed)
Message copied by Waverly Ferrari on Mon Jan 24, 2014  9:53 AM ------      Message from: Vernie Shanks      Created: Sun Jan 23, 2014  8:56 PM       Call patient.      Labs normal.      No change in plan. ------

## 2014-01-24 NOTE — Telephone Encounter (Signed)
Patient aware.

## 2014-06-09 ENCOUNTER — Ambulatory Visit (INDEPENDENT_AMBULATORY_CARE_PROVIDER_SITE_OTHER): Payer: 59 | Admitting: Podiatry

## 2014-06-09 ENCOUNTER — Encounter: Payer: Self-pay | Admitting: Podiatry

## 2014-06-09 ENCOUNTER — Ambulatory Visit (INDEPENDENT_AMBULATORY_CARE_PROVIDER_SITE_OTHER): Payer: 59

## 2014-06-09 VITALS — BP 141/81 | HR 83 | Resp 16 | Ht 70.0 in | Wt 207.0 lb

## 2014-06-09 DIAGNOSIS — M722 Plantar fascial fibromatosis: Secondary | ICD-10-CM

## 2014-06-09 MED ORDER — TRIAMCINOLONE ACETONIDE 10 MG/ML IJ SUSP
10.0000 mg | Freq: Once | INTRAMUSCULAR | Status: AC
  Administered 2014-06-09: 10 mg

## 2014-06-09 NOTE — Progress Notes (Signed)
   Subjective:    Patient ID: Jeffrey [name], male    DOB: 04-27-1956, 58 y.o.   MRN: [id number]  HPI Comments: "I have plantar fasciitis flaring up"  Patient c/o aching plantar heels bilateral, left over right, for about 3 weeks. He has had problems with plantar fasciitis for years. It has worsened over the past 1-2 weeks. He does have AM pain. Takes mobic for knee already.  Foot Pain      Review of Systems  All other systems reviewed and are negative.      Objective:   Physical Exam        Assessment & Plan:

## 2014-06-09 NOTE — Patient Instructions (Signed)

## 2014-06-10 NOTE — Progress Notes (Signed)
Subjective:     Patient ID: Jeffrey [name], male   DOB: 08/24/1956, 58 y.o.   MRN: [id number]  Foot Pain   patient presents with heel pain left over right that's been present for several years and has worsened recently. States that it is making it difficult for him to walk and that he is having trouble in the mornings   Review of Systems  All other systems reviewed and are negative.      Objective:   Physical Exam  Nursing note and vitals reviewed. Constitutional: He is oriented to person, place, and time.  Cardiovascular: Intact distal pulses.   Musculoskeletal: Normal range of motion.  Neurological: He is oriented to person, place, and time.  Skin: Skin is warm.   neurovascular status intact with muscle strength adequate and range of motion subtalar and midtarsal joint within normal limits. Patient is noted to have exquisite discomfort plantar aspect heel left over right with inflammation and fluid of the medial band and depressed arch is. He is found to be well perfused and does have mild to moderate equinus condition     Assessment:     Severe plantar fasciitis left over right long-term nature with acute flareup    Plan:     H&P and x-rays reviewed. Injected the plantar fascia of both heels 3 mg Kenalog 5 mg Xylocaine and applied fascially brace for the left and placed on diclofenac 75 mg twice a day. Gave instructions on physical therapy and that we will make him long-term orthotics when symptoms are under control

## 2014-06-13 ENCOUNTER — Ambulatory Visit (INDEPENDENT_AMBULATORY_CARE_PROVIDER_SITE_OTHER): Payer: 59

## 2014-06-13 ENCOUNTER — Ambulatory Visit (INDEPENDENT_AMBULATORY_CARE_PROVIDER_SITE_OTHER): Payer: 59 | Admitting: Podiatry

## 2014-06-13 ENCOUNTER — Encounter: Payer: Self-pay | Admitting: Podiatry

## 2014-06-13 ENCOUNTER — Telehealth: Payer: Self-pay | Admitting: *Deleted

## 2014-06-13 VITALS — BP 126/76 | HR 73 | Resp 18

## 2014-06-13 DIAGNOSIS — M722 Plantar fascial fibromatosis: Secondary | ICD-10-CM

## 2014-06-13 DIAGNOSIS — R52 Pain, unspecified: Secondary | ICD-10-CM

## 2014-06-13 DIAGNOSIS — T148XXA Other injury of unspecified body region, initial encounter: Secondary | ICD-10-CM

## 2014-06-13 MED ORDER — TRIAMCINOLONE ACETONIDE 10 MG/ML IJ SUSP
10.0000 mg | Freq: Once | INTRAMUSCULAR | Status: AC
  Administered 2014-06-13: 10 mg

## 2014-06-13 NOTE — Telephone Encounter (Signed)
I'm a patient of Dr. Paulla Dolly.  I came in for injections on Thursday.  Yesterday if appears to me or it felt like the tendon snapped on the bottom of the left foot.  I talked to the on-call doctor last night and she told me to call you all this morning.  I returned his call and suggested he schedule an appointment.   He stated he scheduled one for today at 3:45.  I told him that is what I was going to suggest.

## 2014-06-14 NOTE — Progress Notes (Signed)
Subjective:     Patient ID: Jeffrey [name], male   DOB: 1956-03-22, 58 y.o.   MRN: [id number]  HPI patient states my right heel feels fine and my left heel I was coming down some stairs and I felt a pop and it doesn't seem like it is tight in the arch but my arch does not hurt but I'm still having heel pain. Stated he had no bruising   Review of Systems     Objective:   Physical Exam Neurovascular status intact with muscle strength adequate and noted there does appear to be some loss of fibers within the medial band of the plantar fascial left. Patient continues to have persistent pain in the plantar heel and no current arch pain when palpated    Assessment:     Possible tear of some of the fibers the medial band but since there is no arch pain no bruit seen in heel pain still present it appears that there still is fasciitis-like symptomatology    Plan:     Educated him on this and if changes in the arch should occur he is to reappoint for possible him mobilization. Today I did inject one more time the painful plantar heel 3 mg Kenalog 5 mg Xylocaine and scanned for custom orthotics to reduce stress against the arch and heel of both feet. He will return when those are ready or earlier if any issues should occur

## 2014-06-16 ENCOUNTER — Ambulatory Visit: Payer: 59 | Admitting: Podiatry

## 2014-07-01 ENCOUNTER — Other Ambulatory Visit: Payer: Self-pay | Admitting: Nurse Practitioner

## 2014-07-14 ENCOUNTER — Ambulatory Visit: Payer: 59 | Admitting: Podiatry

## 2014-07-18 ENCOUNTER — Ambulatory Visit: Payer: 59 | Admitting: Podiatry

## 2014-07-21 ENCOUNTER — Encounter: Payer: Self-pay | Admitting: Podiatry

## 2014-07-21 ENCOUNTER — Ambulatory Visit (INDEPENDENT_AMBULATORY_CARE_PROVIDER_SITE_OTHER): Payer: 59 | Admitting: Podiatry

## 2014-07-21 DIAGNOSIS — M722 Plantar fascial fibromatosis: Secondary | ICD-10-CM

## 2014-07-21 NOTE — Patient Instructions (Signed)

## 2014-07-25 NOTE — Progress Notes (Signed)
Subjective:     Patient ID: Jeffrey [name], male   DOB: Oct 14, 1955, 58 y.o.   MRN: [id number]  HPIpatient presents to pickup his orthotics stating that he is improving with his feet but still having discomfort after periods of excessive activity   Review of Systems     Objective:   Physical Exam Neurovascular status intact with discomfort still noted upon palpation to the plantar heel but improvement of a significant nature    Assessment:     Improved plantar fasciitis heels    Plan:     Instructed on continued physical therapy anti-inflammatories and went ahead today and dispensed orthotics with all instructions on usage.

## 2014-08-02 ENCOUNTER — Ambulatory Visit (INDEPENDENT_AMBULATORY_CARE_PROVIDER_SITE_OTHER): Payer: 59 | Admitting: Family Medicine

## 2014-08-02 ENCOUNTER — Encounter: Payer: Self-pay | Admitting: Family Medicine

## 2014-08-02 VITALS — BP 125/83 | HR 81 | Temp 97.9°F | Ht 70.0 in | Wt 209.4 lb

## 2014-08-02 DIAGNOSIS — J069 Acute upper respiratory infection, unspecified: Secondary | ICD-10-CM

## 2014-08-02 MED ORDER — AZITHROMYCIN 250 MG PO TABS: ORAL_TABLET | ORAL | Status: DC

## 2014-08-02 NOTE — Progress Notes (Signed)
   Subjective:    Patient ID: Jeffrey [name], male    DOB: 1956-04-05, 58 y.o.   MRN: [id number]  HPI Patient c/o uri sx's  Review of Systems  Constitutional: Negative for fever.  HENT: Negative for ear pain.   Eyes: Negative for discharge.  Respiratory: Negative for cough.   Cardiovascular: Negative for chest pain.  Gastrointestinal: Negative for abdominal distention.  Endocrine: Negative for polyuria.  Genitourinary: Negative for difficulty urinating.  Musculoskeletal: Negative for gait problem and neck pain.  Skin: Negative for color change and rash.  Neurological: Negative for speech difficulty and headaches.  Psychiatric/Behavioral: Negative for agitation.       Objective:    BP 125/83 mmHg  Pulse 81  Temp(Src) 97.9 F (36.6 C) (Oral)  Ht 5\' 10"  (1.778 m)  Wt 209 lb 6.4 oz (94.983 kg)  BMI 30.05 kg/m2 Physical Exam  Constitutional: He is oriented to person, place, and time. He appears well-developed and well-nourished.  HENT:  Head: Normocephalic and atraumatic.  Mouth/Throat: Oropharynx is clear and moist.  Eyes: Pupils are equal, round, and reactive to light.  Neck: Normal range of motion. Neck supple.  Cardiovascular: Normal rate and regular rhythm.   No murmur heard. Pulmonary/Chest: Effort normal and breath sounds normal.  Abdominal: Soft. Bowel sounds are normal. There is no tenderness.  Neurological: He is alert and oriented to person, place, and time.  Skin: Skin is warm and dry.  Psychiatric: He has a normal mood and affect.          Assessment & Plan:     ICD-9-CM ICD-10-CM   1. URI (upper respiratory infection) 465.9 J06.9 azithromycin (ZITHROMAX) 250 MG tablet   Push po fluids, rest, tylenol and motrin otc prn as directed for fever, arthralgias, and myalgias.  Follow up prn if sx's continue or persist.  No Follow-up on file.  Lysbeth Penner FNP

## 2014-08-09 ENCOUNTER — Other Ambulatory Visit: Payer: Self-pay | Admitting: Family Medicine

## 2014-08-09 DIAGNOSIS — J069 Acute upper respiratory infection, unspecified: Secondary | ICD-10-CM

## 2014-08-09 MED ORDER — AZITHROMYCIN 250 MG PO TABS
ORAL_TABLET | ORAL | Status: DC
Start: 2014-08-09 — End: 2015-03-13

## 2014-08-09 NOTE — Telephone Encounter (Signed)
zpak sent to pharm

## 2014-08-09 NOTE — Telephone Encounter (Signed)
Aware,z-pk sent in.

## 2014-08-17 ENCOUNTER — Ambulatory Visit (INDEPENDENT_AMBULATORY_CARE_PROVIDER_SITE_OTHER): Payer: 59 | Admitting: Podiatry

## 2014-08-17 VITALS — BP 127/80 | HR 78 | Resp 16

## 2014-08-17 DIAGNOSIS — M722 Plantar fascial fibromatosis: Secondary | ICD-10-CM

## 2014-08-17 MED ORDER — TRIAMCINOLONE ACETONIDE 10 MG/ML IJ SUSP: 10.0000 mg | Freq: Once | INTRAMUSCULAR | Status: DC

## 2014-08-17 NOTE — Progress Notes (Signed)
Subjective:     Patient ID: Jeffrey [name], male   DOB: January 11, 1956, 58 y.o.   MRN: [id number]  HPI patient presents stating that the inside of the heels are doing well but is having pain in the outside of his left heel that's just started up recently   Review of Systems     Objective:   Physical Exam Neurovascular status intact with pain in the outside center of the left heel plantar with inflammation and fluid buildup noted    Assessment:     Lateral band plantar fasciitis left    Plan:     From the lateral side injected 3 mg Kenalog 5 mg Xylocaine and instructed on physical therapy

## 2014-09-27 ENCOUNTER — Other Ambulatory Visit: Payer: Self-pay

## 2014-09-27 MED ORDER — AMLODIPINE BESYLATE 5 MG PO TABS: 5.0000 mg | ORAL_TABLET | Freq: Every morning | ORAL | Status: DC

## 2014-09-27 MED ORDER — LISINOPRIL 10 MG PO TABS: 10.0000 mg | ORAL_TABLET | Freq: Every morning | ORAL | Status: DC

## 2014-12-22 ENCOUNTER — Other Ambulatory Visit: Payer: Self-pay | Admitting: Family Medicine

## 2014-12-25 ENCOUNTER — Other Ambulatory Visit: Payer: Self-pay | Admitting: Family Medicine

## 2015-01-02 ENCOUNTER — Other Ambulatory Visit: Payer: Self-pay | Admitting: Family Medicine

## 2015-02-09 ENCOUNTER — Ambulatory Visit (INDEPENDENT_AMBULATORY_CARE_PROVIDER_SITE_OTHER): Payer: 59 | Admitting: Podiatry

## 2015-02-09 ENCOUNTER — Encounter: Payer: Self-pay | Admitting: Podiatry

## 2015-02-09 VITALS — BP 145/86 | HR 82 | Resp 13

## 2015-02-09 DIAGNOSIS — M722 Plantar fascial fibromatosis: Secondary | ICD-10-CM | POA: Diagnosis not present

## 2015-02-09 MED ORDER — TRIAMCINOLONE ACETONIDE 10 MG/ML IJ SUSP
10.0000 mg | Freq: Once | INTRAMUSCULAR | Status: AC
  Administered 2015-02-09: 10 mg

## 2015-02-10 NOTE — Progress Notes (Signed)
Subjective:     Patient ID: Jeffrey [name], male   DOB: [birth year], 59 y.o.   MRN: [id number]  HPI patient presents stating he's develop pain again in his left heel after 6 months mostly in the center in the outside and he was concerned that his orthotic did not feel well on the left   Review of Systems     Objective:   Physical Exam Neurovascular status intact and left heel which actually looks pretty good with no indications that does not fit properly the pain is centered in the outside and middle of the left heel    Assessment:     Plantar fasciitis left center and lateral band    Plan:     Injected from the lateral side 3 mg Kenalog 5 mg Xylocaine and advised on supportive shoes and we will change the orthotic if it continues to give him problems

## 2015-03-10 ENCOUNTER — Other Ambulatory Visit: Payer: Self-pay | Admitting: Ophthalmology

## 2015-03-10 ENCOUNTER — Encounter (HOSPITAL_COMMUNITY): Payer: Self-pay | Admitting: *Deleted

## 2015-03-10 NOTE — Progress Notes (Signed)
Pt denies SOB, chest pain, and being under the care of a cardiologist. Pt denies having an echo and cardiac cath but stated that a stress test was done " about 5 years ago" however, pt could not remember the name of the practice or the physician so that records could be requested. When asked about the results of the stress test, pt stated , " everything was okay." Spoke with Maudie Mercury at Dr. Baird Cancer office regarding pre-op instructions for Aspirin; MD advised that pt stop Aspirin. Pt instructed to stop taking Aspirin, otc vitamins and herbal medications. Do not take any NSAIDs ie: Ibuprofen, Advil, Naproxen or any medication containing Aspirin. Pt verbalized understanding of all pre-op instructions. MD to sign orders at the end of clinicals per Maudie Mercury.

## 2015-03-10 NOTE — Progress Notes (Signed)
   03/10/15 1433  OBSTRUCTIVE SLEEP APNEA  Have you ever been diagnosed with sleep apnea through a sleep study? No  Do you snore loudly (loud enough to be heard through closed doors)?  1  Do you often feel tired, fatigued, or sleepy during the daytime? 0  Has anyone observed you stop breathing during your sleep? 0  Do you have, or are you being treated for high blood pressure? 1  BMI more than 35 kg/m2? 0  Age over 59 years old? 1  Gender: 1  Obstructive Sleep Apnea Score 4

## 2015-03-13 ENCOUNTER — Ambulatory Visit (HOSPITAL_COMMUNITY): Payer: 59 | Admitting: Anesthesiology

## 2015-03-13 ENCOUNTER — Ambulatory Visit (HOSPITAL_COMMUNITY)
Admission: RE | Admit: 2015-03-13 | Discharge: 2015-03-13 | Disposition: A | Discharge status: Home / Self Care | Payer: 59 | Source: Ambulatory Visit | Attending: Ophthalmology | Admitting: Ophthalmology

## 2015-03-13 ENCOUNTER — Encounter (HOSPITAL_COMMUNITY)
Admission: RE | Disposition: A | Discharge status: Home / Self Care | Payer: Self-pay | Source: Ambulatory Visit | Attending: Ophthalmology

## 2015-03-13 DIAGNOSIS — Z87442 Personal history of urinary calculi: Secondary | ICD-10-CM | POA: Insufficient documentation

## 2015-03-13 DIAGNOSIS — M1711 Unilateral primary osteoarthritis, right knee: Secondary | ICD-10-CM | POA: Diagnosis not present

## 2015-03-13 DIAGNOSIS — Z882 Allergy status to sulfonamides status: Secondary | ICD-10-CM | POA: Diagnosis not present

## 2015-03-13 DIAGNOSIS — I10 Essential (primary) hypertension: Secondary | ICD-10-CM | POA: Diagnosis not present

## 2015-03-13 DIAGNOSIS — Z7982 Long term (current) use of aspirin: Secondary | ICD-10-CM | POA: Insufficient documentation

## 2015-03-13 DIAGNOSIS — H33021 Retinal detachment with multiple breaks, right eye: Secondary | ICD-10-CM | POA: Diagnosis present

## 2015-03-13 HISTORY — DX: Rider (driver) (passenger) of other motorcycle injured in unspecified traffic accident, initial encounter: V29.99XA

## 2015-03-13 HISTORY — DX: Calculus of kidney: N20.0

## 2015-03-13 HISTORY — PX: SCLERAL BUCKLE: SHX5340

## 2015-03-13 HISTORY — DX: Personal history of other venous thrombosis and embolism: Z86.718

## 2015-03-13 HISTORY — PX: GAS INSERTION: SHX5336

## 2015-03-13 LAB — CBC
HCT: 42.6 % (ref 39.0–52.0)
Hemoglobin: 15 g/dL (ref 13.0–17.0)
MCH: 29.1 pg (ref 26.0–34.0)
MCHC: 35.2 g/dL (ref 30.0–36.0)
MCV: 82.6 fL (ref 78.0–100.0)
Platelets: 200 10*3/uL (ref 150–400)
RBC: 5.16 MIL/uL (ref 4.22–5.81)
RDW: 12.8 % (ref 11.5–15.5)
WBC: 5.1 10*3/uL (ref 4.0–10.5)

## 2015-03-13 LAB — BASIC METABOLIC PANEL
ANION GAP: 7 (ref 5–15)
BUN: 17 mg/dL (ref 6–20)
CHLORIDE: 108 mmol/L (ref 101–111)
CO2: 24 mmol/L (ref 22–32)
Calcium: 9.5 mg/dL (ref 8.9–10.3)
Creatinine, Ser: 1.01 mg/dL (ref 0.61–1.24)
GFR calc non Af Amer: >60 mL/min (ref >60)
Glucose, Bld: 108 mg/dL — ABNORMAL HIGH (ref 65–99)
Potassium: 3.9 mmol/L (ref 3.5–5.1)
Sodium: 139 mmol/L (ref 135–145)

## 2015-03-13 SURGERY — SCLERAL BUCKLE
Anesthesia: General | Site: Eye | Laterality: Right

## 2015-03-13 MED ORDER — ONDANSETRON HCL 4 MG/2ML IJ SOLN
INTRAMUSCULAR | Status: DC | PRN
  Administered 2015-03-13: 4 mg via INTRAVENOUS

## 2015-03-13 MED ORDER — TOBRAMYCIN-DEXAMETHASONE 0.3-0.1 % OP OINT
TOPICAL_OINTMENT | OPHTHALMIC | Status: AC
  Filled 2015-03-13: qty 3.5

## 2015-03-13 MED ORDER — MIDAZOLAM HCL 5 MG/5ML IJ SOLN
INTRAMUSCULAR | Status: DC | PRN
  Administered 2015-03-13: 2 mg via INTRAVENOUS

## 2015-03-13 MED ORDER — SODIUM CHLORIDE 0.9 % IV SOLN
INTRAVENOUS | Status: DC
  Administered 2015-03-13: 10 mL/h via INTRAVENOUS

## 2015-03-13 MED ORDER — BUPIVACAINE HCL (PF) 0.75 % IJ SOLN
INTRAMUSCULAR | Status: AC
  Filled 2015-03-13: qty 10

## 2015-03-13 MED ORDER — DEXAMETHASONE SODIUM PHOSPHATE 10 MG/ML IJ SOLN
INTRAMUSCULAR | Status: DC | PRN
  Administered 2015-03-13: 10 mg

## 2015-03-13 MED ORDER — MIDAZOLAM HCL 2 MG/2ML IJ SOLN
INTRAMUSCULAR | Status: AC
  Filled 2015-03-13: qty 2

## 2015-03-13 MED ORDER — GLYCOPYRROLATE 0.2 MG/ML IJ SOLN
INTRAMUSCULAR | Status: DC | PRN
  Administered 2015-03-13: .4 mg via INTRAVENOUS

## 2015-03-13 MED ORDER — 0.9 % SODIUM CHLORIDE (POUR BTL) OPTIME
TOPICAL | Status: DC | PRN
  Administered 2015-03-13: 1000 mL

## 2015-03-13 MED ORDER — HYPROMELLOSE (GONIOSCOPIC) 2.5 % OP SOLN
OPHTHALMIC | Status: DC | PRN
  Administered 2015-03-13: 10 [drp] via OPHTHALMIC

## 2015-03-13 MED ORDER — LIDOCAINE HCL (CARDIAC) 20 MG/ML IV SOLN
INTRAVENOUS | Status: DC | PRN
  Administered 2015-03-13: 40 mg via INTRAVENOUS

## 2015-03-13 MED ORDER — EPHEDRINE SULFATE 50 MG/ML IJ SOLN
INTRAMUSCULAR | Status: DC | PRN
  Administered 2015-03-13 (×2): 10 mg via INTRAVENOUS

## 2015-03-13 MED ORDER — CEFAZOLIN (ANCEF) 1 G IV SOLR
1.0000 g | INTRAVENOUS | Status: AC
  Administered 2015-03-13: 1 g
  Filled 2015-03-13: qty 1

## 2015-03-13 MED ORDER — ATROPINE SULFATE 1 % OP SOLN
OPHTHALMIC | Status: DC | PRN
  Administered 2015-03-13: 1 [drp] via OPHTHALMIC

## 2015-03-13 MED ORDER — LIDOCAINE HCL 2 % IJ SOLN
INTRAMUSCULAR | Status: AC
  Filled 2015-03-13: qty 20

## 2015-03-13 MED ORDER — ONDANSETRON HCL 4 MG/2ML IJ SOLN: 4.0000 mg | Freq: Once | INTRAMUSCULAR | Status: DC | PRN

## 2015-03-13 MED ORDER — ATROPINE SULFATE 1 % OP SOLN
1.0000 [drp] | Freq: Once | OPHTHALMIC | Status: DC
  Filled 2015-03-13: qty 5

## 2015-03-13 MED ORDER — HYALURONIDASE HUMAN 150 UNIT/ML IJ SOLN
INTRAMUSCULAR | Status: AC
  Filled 2015-03-13: qty 1

## 2015-03-13 MED ORDER — TETRACAINE HCL 0.5 % OP SOLN
OPHTHALMIC | Status: AC
  Filled 2015-03-13: qty 2

## 2015-03-13 MED ORDER — ATROPINE SULFATE 1 % OP SOLN
1.0000 [drp] | OPHTHALMIC | Status: AC | PRN
  Administered 2015-03-13 (×3): 1 [drp] via OPHTHALMIC

## 2015-03-13 MED ORDER — FENTANYL CITRATE (PF) 250 MCG/5ML IJ SOLN
INTRAMUSCULAR | Status: AC
  Filled 2015-03-13: qty 5

## 2015-03-13 MED ORDER — BSS IO SOLN
INTRAOCULAR | Status: AC
  Filled 2015-03-13: qty 15

## 2015-03-13 MED ORDER — LACTATED RINGERS IV SOLN
INTRAVENOUS | Status: DC
  Administered 2015-03-13: 50 mL/h via INTRAVENOUS

## 2015-03-13 MED ORDER — LIDOCAINE HCL 4 % MT SOLN
OROMUCOSAL | Status: DC | PRN
  Administered 2015-03-13: 4 mL via TOPICAL

## 2015-03-13 MED ORDER — BUPIVACAINE HCL (PF) 0.75 % IJ SOLN
INTRAMUSCULAR | Status: DC | PRN
  Administered 2015-03-13: 3 mL

## 2015-03-13 MED ORDER — PHENYLEPHRINE HCL 2.5 % OP SOLN
1.0000 [drp] | OPHTHALMIC | Status: AC | PRN
  Administered 2015-03-13 (×3): 1 [drp] via OPHTHALMIC

## 2015-03-13 MED ORDER — ROCURONIUM BROMIDE 100 MG/10ML IV SOLN
INTRAVENOUS | Status: DC | PRN
  Administered 2015-03-13: 50 mg via INTRAVENOUS

## 2015-03-13 MED ORDER — OXYCODONE HCL 5 MG PO TABS: 5.0000 mg | ORAL_TABLET | Freq: Once | ORAL | Status: DC | PRN

## 2015-03-13 MED ORDER — FENTANYL CITRATE (PF) 250 MCG/5ML IJ SOLN
INTRAMUSCULAR | Status: DC | PRN
  Administered 2015-03-13: 100 ug via INTRAVENOUS

## 2015-03-13 MED ORDER — TOBRAMYCIN-DEXAMETHASONE 0.3-0.1 % OP OINT
TOPICAL_OINTMENT | OPHTHALMIC | Status: DC | PRN
  Administered 2015-03-13 (×2): 1 via OPHTHALMIC

## 2015-03-13 MED ORDER — ATROPINE SULFATE 1 % OP SOLN
OPHTHALMIC | Status: AC
  Filled 2015-03-13: qty 5

## 2015-03-13 MED ORDER — OXYCODONE HCL 5 MG/5ML PO SOLN: 5.0000 mg | Freq: Once | ORAL | Status: DC | PRN

## 2015-03-13 MED ORDER — DEXAMETHASONE SODIUM PHOSPHATE 10 MG/ML IJ SOLN
INTRAMUSCULAR | Status: AC
  Filled 2015-03-13: qty 1

## 2015-03-13 MED ORDER — OXYCODONE-ACETAMINOPHEN 5-325 MG PO TABS: 1.0000 | ORAL_TABLET | ORAL | Status: DC | PRN

## 2015-03-13 MED ORDER — LIDOCAINE HCL (PF) 1 % IJ SOLN
INTRAMUSCULAR | Status: AC
  Filled 2015-03-13: qty 30

## 2015-03-13 MED ORDER — HYPROMELLOSE (GONIOSCOPIC) 2.5 % OP SOLN
OPHTHALMIC | Status: AC
  Filled 2015-03-13: qty 15

## 2015-03-13 MED ORDER — PROPOFOL 10 MG/ML IV BOLUS
INTRAVENOUS | Status: DC | PRN
  Administered 2015-03-13: 180 mg via INTRAVENOUS

## 2015-03-13 MED ORDER — CEFAZOLIN SUBCONJUNCTIVAL INJECTION 100 MG/0.5 ML
200.0000 mg | INJECTION | SUBCONJUNCTIVAL | Status: AC
  Administered 2015-03-13: 200 mg via SUBCONJUNCTIVAL
  Filled 2015-03-13: qty 5

## 2015-03-13 MED ORDER — NEOSTIGMINE METHYLSULFATE 10 MG/10ML IV SOLN
INTRAVENOUS | Status: DC | PRN
  Administered 2015-03-13: 3 mg via INTRAVENOUS

## 2015-03-13 MED ORDER — HYDROMORPHONE HCL 1 MG/ML IJ SOLN: 0.2500 mg | INTRAMUSCULAR | Status: DC | PRN

## 2015-03-13 MED ORDER — BSS IO SOLN
INTRAOCULAR | Status: DC | PRN
  Administered 2015-03-13: 15 mL

## 2015-03-13 MED ORDER — HYALURONIDASE HUMAN 150 UNIT/ML IJ SOLN
INTRAMUSCULAR | Status: DC | PRN
  Administered 2015-03-13: 150 [IU]

## 2015-03-13 MED ORDER — PHENYLEPHRINE HCL 2.5 % OP SOLN
1.0000 [drp] | OPHTHALMIC | Status: DC | PRN
  Filled 2015-03-13: qty 2

## 2015-03-13 SURGICAL SUPPLY — 35 items
APL SRG 3 HI ABS STRL LF PLS (MISCELLANEOUS) ×1
APPLICATOR COTTON TIP 6IN STRL (MISCELLANEOUS) ×2 IMPLANT
APPLICATOR DR MATTHEWS STRL (MISCELLANEOUS) ×2 IMPLANT
BAND SCLERAL BUCKLING TYPE 42 (Ophthalmic Related) ×1 IMPLANT
BLADE MINI 60D BLUE (BLADE) ×2 IMPLANT
CAUTERY EYE LOW TEMP 1300F FIN (OPHTHALMIC RELATED) ×2 IMPLANT
CORDS BIPOLAR (ELECTRODE) ×1 IMPLANT
COVER SURGICAL LIGHT HANDLE (MISCELLANEOUS) ×2 IMPLANT
FILTER BLUE MILLIPORE (MISCELLANEOUS) ×1 IMPLANT
GAS OPHTHALMIC (MISCELLANEOUS) ×1 IMPLANT
GLOVE BIO SURGEON STRL SZ7 (GLOVE) ×1 IMPLANT
GLOVE BIO SURGEON STRL SZ7.5 (GLOVE) ×3 IMPLANT
GOWN STRL REUS W/ TWL LRG LVL3 (GOWN DISPOSABLE) ×2 IMPLANT
GOWN STRL REUS W/TWL LRG LVL3 (GOWN DISPOSABLE) ×4
KIT BASIN OR (CUSTOM PROCEDURE TRAY) ×2 IMPLANT
KIT ROOM TURNOVER OR (KITS) ×2 IMPLANT
NDL 18GX1X1/2 (RX/OR ONLY) (NEEDLE) ×1 IMPLANT
NDL HYPO 30X.5 LL (NEEDLE) ×2 IMPLANT
NDL RETROBULBAR 25GX1.5 (NEEDLE) IMPLANT
NEEDLE 18GX1X1/2 (RX/OR ONLY) (NEEDLE) ×2 IMPLANT
NEEDLE HYPO 30X.5 LL (NEEDLE) ×4 IMPLANT
NEEDLE RETROBULBAR 25GX1.5 (NEEDLE) ×2 IMPLANT
NS IRRIG 1000ML POUR BTL (IV SOLUTION) ×2 IMPLANT
PACK VITRECTOMY CUSTOM (CUSTOM PROCEDURE TRAY) ×2 IMPLANT
PAD ARMBOARD 7.5X6 YLW CONV (MISCELLANEOUS) ×4 IMPLANT
ROLLS DENTAL (MISCELLANEOUS) ×2 IMPLANT
SHEET MEDIUM DRAPE 40X70 STRL (DRAPES) ×2 IMPLANT
STOCKINETTE IMPERVIOUS 9X36 MD (GAUZE/BANDAGES/DRESSINGS) ×4 IMPLANT
STOPCOCK 4 WAY LG BORE MALE ST (IV SETS) ×1 IMPLANT
SUT ETHILON 5.0 S-24 (SUTURE) ×2 IMPLANT
SUT SILK 2 0 (SUTURE) ×2
SUT SILK 2-0 18XBRD TIE 12 (SUTURE) ×1 IMPLANT
TOWEL OR 17X24 6PK STRL BLUE (TOWEL DISPOSABLE) ×4 IMPLANT
WATER STERILE IRR 1000ML POUR (IV SOLUTION) ×2 IMPLANT
WIPE INSTRUMENT VISIWIPE 73X73 (MISCELLANEOUS) ×1 IMPLANT

## 2015-03-13 NOTE — Transfer of Care (Signed)
Immediate Anesthesia Transfer of Care Note  Patient: Jeffrey [name]  Procedure(s) Performed: Procedure(s): SCLERAL BUCKLE  (Right) INSERTION OF GAS (Right)  Patient Location: PACU  Anesthesia Type:General  Level of Consciousness: awake, alert  and oriented  Airway & Oxygen Therapy: Patient Spontanous Breathing and Patient connected to nasal cannula oxygen  Post-op Assessment: Report given to RN and Post -op Vital signs reviewed and stable  Post vital signs: Reviewed and stable  Last Vitals:  Filed Vitals:   03/13/15 1445  BP: 124/77  Pulse: 76  Temp: 36.6 C  Resp: 19    Complications: No apparent anesthesia complications

## 2015-03-13 NOTE — Anesthesia Procedure Notes (Signed)
Procedure Name: Intubation Date/Time: 03/13/2015 12:57 PM Performed by: Mariea Clonts Pre-anesthesia Checklist: Patient identified, Timeout performed, Emergency Drugs available, Suction available and Patient being monitored Patient Re-evaluated:Patient Re-evaluated prior to inductionOxygen Delivery Method: Circle system utilized Preoxygenation: Pre-oxygenation with 100% oxygen Intubation Type: IV induction Ventilation: Mask ventilation without difficulty Laryngoscope Size: Miller and 3 Grade View: Grade I Tube type: Oral Tube size: 7.5 mm Number of attempts: 1 Placement Confirmation: ETT inserted through vocal cords under direct vision,  breath sounds checked- equal and bilateral and positive ETCO2 Tube secured with: Tape Dental Injury: Teeth and Oropharynx as per pre-operative assessment

## 2015-03-13 NOTE — Anesthesia Preprocedure Evaluation (Signed)
Anesthesia Evaluation  Patient identified by MRN, date of birth, ID band Patient awake    Reviewed: Allergy & Precautions, NPO status , Patient's Chart, lab work & pertinent test results  Airway Mallampati: II  TM Distance: >3 FB Neck ROM: Full    Dental  (+) Teeth Intact, Dental Advisory Given   Pulmonary    breath sounds clear to auscultation       Cardiovascular hypertension,  Rhythm:Regular Rate:Normal     Neuro/Psych    GI/Hepatic   Endo/Other    Renal/GU      Musculoskeletal   Abdominal   Peds  Hematology   Anesthesia Other Findings   Reproductive/Obstetrics                            Anesthesia Physical Anesthesia Plan  ASA: II  Anesthesia Plan: General   Post-op Pain Management:    Induction: Intravenous  Airway Management Planned: Oral ETT  Additional Equipment:   Intra-op Plan:   Post-operative Plan: Extubation in OR  Informed Consent: I have reviewed the patients History and Physical, chart, labs and discussed the procedure including the risks, benefits and alternatives for the proposed anesthesia with the patient or authorized representative who has indicated his/her understanding and acceptance.   Dental advisory given  Plan Discussed with: CRNA and Anesthesiologist  Anesthesia Plan Comments:        Anesthesia Quick Evaluation  

## 2015-03-13 NOTE — Progress Notes (Signed)
Called Dr. Baird Cancer to see if patient would need preop antibiotic due to hx. Knee replacement and notified doctor that patient last dose of aspirin was 03/11/15. Per Dr. Baird Cancer patient would not need antibiotic and aspirin was okay.
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
## 2015-03-13 NOTE — Brief Op Note (Signed)
03/13/2015  2:47 PM  PATIENT:  Jeffrey [name]  59 y.o. male  PRE-OPERATIVE DIAGNOSIS:  RETINAL DETACHMENT WITH MULTIPLE BREAKS RIGHT EYE  POST-OPERATIVE DIAGNOSIS:  RETINAL DETACHMENT WITH MULTIPLE BREAKS RIGHT EYE  PROCEDURE:  Procedure(s): SCLERAL BUCKLE  (Right) INSERTION OF GAS (Right)  SURGEON:  Surgeon(s) and Role:    * Sherlynn Stalls, MD - Primary  PHYSICIAN ASSISTANT:   ASSISTANTS: none   ANESTHESIA:   general  EBL:  Total I/O In: 1100 [I.V.:1100] Out: -   BLOOD ADMINISTERED:none  DRAINS: none   LOCAL MEDICATIONS USED:  BUPIVICAINE   SPECIMEN:  No Specimen  DISPOSITION OF SPECIMEN:  N/A  COUNTS:  YES  TOURNIQUET:  * No tourniquets in log *  DICTATION: .Other Dictation: Dictation Number [id number]  PLAN OF CARE: Discharge to home after PACU  PATIENT DISPOSITION:  PACU - hemodynamically stable.   Delay start of Pharmacological VTE agent (>24hrs) due to surgical blood loss or risk of bleeding: not applicable

## 2015-03-13 NOTE — Discharge Instructions (Signed)
Left side down at all times keep eyes looking straight or downward Return to office in the morning at 7:45 am Eat something when feels up to it, and take a pain pill.

## 2015-03-14 ENCOUNTER — Encounter (HOSPITAL_COMMUNITY): Payer: Self-pay | Admitting: Ophthalmology

## 2015-03-14 NOTE — Op Note (Signed)
NAMEZAKARI, BATHE NO.:  [id number]  MEDICAL RECORD NO.:  [id number]  LOCATION:  [facility]                         FACILITY:  [facility]  PHYSICIAN:  Corliss Parish, MD    DATE OF BIRTH:  12-03-1955  DATE OF PROCEDURE:  03/13/2015 DATE OF DISCHARGE:  03/13/2015                              OPERATIVE REPORT   SURGEON:  Corliss Parish, MD.  ANESTHESIA:  General with endotracheal intubation.  PREOPERATIVE DIAGNOSIS:  Retinal detachment, right eye.  POSTOPERATIVE DIAGNOSIS:  Retinal detachment, right eye.  PROCEDURE:  Scleral buckle with gas injection of the right eye.  COMPLICATIONS:  None.  FINDINGS:  There were multiple retinal breaks and a temporary retinal detachment of the right eye.  DESCRIPTION OF PROCEDURE:  The patient was identified in the preoperative holding area.  He was then taken to the operating room where he was sedated and placed under general anesthesia.  At that point in time, the right eye was anesthetized for postoperative pain using a retrobulbar block consisting of 1:1 mixture of 0.75% bupivacaine and 1% lidocaine with 150 units of Halonix.  After excellent akinesia and anesthesia was obtained, the right eye was prepped and draped in usual sterile fashion for ocular surgery including trimming of lashes. Lieberman speculum was placed between the right upper and lower eyelids for exposure.  360-degree conjunctival peritomy was performed with 0.3 forceps and Westcott Scissors.  We prepared the eye for a standard scleral buckle surgery.  After this was completed, [employer] were used to dissect Tenon's capsule off the sclera in all 4 quadrants.  Four recti muscles were isolated and looped using 2-0 silk sutures.  The eye was then inspected with scleral depression in a Watzke retractor.  Retinal breaks were identified and marked on the external sclera using a surgical pen.  Following this, cryotherapy was placed confluently  around retinal breaks that were identified in the superior temporal, the temporal, superior, nasal, and nasal aspects of the retina.  In total, 4 retinal breaks were identified.  After the cryotherapy was placed, the eye was prepared for scleral buckle by placing 5-0 nylon sutures into each of the oblique quadrants.  After the sutures were placed, the eye was encircled with a #42 buckle, the ends of the buckle were brought in the inferonasal quadrant joint with a #70 sleeve.  Buckle height was tightened.  The excess buckle material was trimmed and discarded.  5-0 nylon sutures were then tied and knots rotated posteriorly and trimmed.  There was excellent support of the retinal breaks at this point of time.  Next, the eye was injected with a 0.3 mL volume of 100% C3F8 gas.  This was administered using a 30-gauge needle 4.0 mm posterior to the limbus.  Needle was removed.  Gas bubble was seen in the vitreous cavity.  Intraocular pressure was assessed and found to be within normal limits.  Therefore, the 2-0 silk sutures were removed from the muscle insertions.  Tenon's capsule and conjunctiva were then reapproximated to the limbus using 7-0 Vicryl suture in interrupted fashion.  Once this was completed, the eye looked very good. The eye was then  treated with subconjunctival injections of 15 mg of Ancef and 1 mg of dexamethasone.  The eye was treated topically with 1 drop of 1% atropine and TobraDex ointment.  Speculum was removed. The eyelids were cleaned and closed.  They were then patched and shielded.  The patient was then awoken and was taken to recovery in excellent condition having tolerated the procedure well.     Corliss Parish, MD     JBS/MEDQ  D:  03/13/2015  T:  03/14/2015  Job:  [id number]

## 2015-03-14 NOTE — Anesthesia Postprocedure Evaluation (Signed)
  Anesthesia Post-op Note  Patient: Jeffrey [name]  Procedure(s) Performed: Procedure(s): SCLERAL BUCKLE  (Right) INSERTION OF GAS (Right)  Patient Location: PACU  Anesthesia Type:General  Level of Consciousness: awake, alert  and oriented  Airway and Oxygen Therapy: Patient Spontanous Breathing and Patient connected to nasal cannula oxygen  Post-op Pain: mild  Post-op Assessment: Post-op Vital signs reviewed, Patient's Cardiovascular Status Stable, Respiratory Function Stable and Patent Airway              Post-op Vital Signs: stable  Last Vitals:  Filed Vitals:   03/13/15 1546  BP: 112/65  Pulse:   Temp:   Resp: 14    Complications: No apparent anesthesia complications

## 2015-03-30 ENCOUNTER — Other Ambulatory Visit: Payer: Self-pay | Admitting: Family Medicine

## 2015-05-04 ENCOUNTER — Other Ambulatory Visit: Payer: Self-pay | Admitting: Family Medicine

## 2015-06-08 ENCOUNTER — Ambulatory Visit (INDEPENDENT_AMBULATORY_CARE_PROVIDER_SITE_OTHER): Payer: 59 | Admitting: Family Medicine

## 2015-06-08 ENCOUNTER — Encounter: Payer: Self-pay | Admitting: Family Medicine

## 2015-06-08 VITALS — BP 135/86 | HR 96 | Temp 97.5°F | Ht 70.0 in | Wt 209.0 lb

## 2015-06-08 DIAGNOSIS — L299 Pruritus, unspecified: Secondary | ICD-10-CM | POA: Diagnosis not present

## 2015-06-08 MED ORDER — TRIAMCINOLONE ACETONIDE 0.1 % EX CREA: 1.0000 "application " | TOPICAL_CREAM | Freq: Two times a day (BID) | CUTANEOUS | Status: DC

## 2015-06-08 NOTE — Progress Notes (Signed)
   HPI  Patient presents today here with itching  Explained that the cut just morning with itching of his bilateral feet the Awake. He States That the Itching Is in a Sock-like Distribution to His Lower Third. He Denies Any Rash, Fever, Chills, Exposure to [employer], [employer], or [employer].  He States That He Was Riding a [place] One Day Ago He Had [place], [employer], and Boots on. His Boots Were Lower Than the Distribution of His Current Itching.  Never had itching weakness, he tried [employer] cream with some improvement but then the itching returned.  PMH: Smoking status noted ROS: Per HPI  Objective: BP 135/86 mmHg  Pulse 96  Temp(Src) 97.5 F (36.4 C) (Oral)  Ht 5\' 10"  (1.778 m)  Wt 209 lb (94.802 kg)  BMI 29.99 kg/m2 Gen: NAD, alert, cooperative with exam HEENT: NCAT CV: RRR, good S1/S2, no murmur Resp: CTABL, no wheezes, non-labored Ext: No edema, warm Neuro: Alert and oriented, No gross deficits Skin: Bilateral feet with no rash, erythema, warmth, or lesion of any sort. No tenderness to palpation.  Assessment and plan:  # Pruritus, itching Unclear etiology, given his distribution I think the best that is contact dermatitis which is developing. Treat with triamcinolone cream Also discussed double anti-histamines including Claritin or similar medication plus Pepcid Return precautions reviewed.   Meds ordered this encounter  Medications  . triamcinolone cream (KENALOG) 0.1 %    Sig: Apply 1 application topically 2 (two) times daily.    Dispense:  30 g    Refill:  [place], MD [facility] 06/08/2015, 4:33 PM

## 2015-06-08 NOTE — Patient Instructions (Signed)
Great to meet you!  Try the cream  If it does not completely alleviate it or if you rdevelop a rash please try the following pills  An antihistamine (claritin, zyrtec, or allegra) - 1 pill daily PLus  Pepcid - 1 pill twiced daily

## 2015-08-02 ENCOUNTER — Encounter (INDEPENDENT_AMBULATORY_CARE_PROVIDER_SITE_OTHER): Payer: Self-pay

## 2015-08-02 ENCOUNTER — Encounter: Payer: Self-pay | Admitting: Family Medicine

## 2015-08-02 ENCOUNTER — Ambulatory Visit (INDEPENDENT_AMBULATORY_CARE_PROVIDER_SITE_OTHER): Payer: 59 | Admitting: Family Medicine

## 2015-08-02 VITALS — BP 130/87 | HR 85 | Temp 97.5°F | Ht 70.0 in | Wt 204.6 lb

## 2015-08-02 DIAGNOSIS — H6692 Otitis media, unspecified, left ear: Secondary | ICD-10-CM | POA: Diagnosis not present

## 2015-08-02 DIAGNOSIS — J019 Acute sinusitis, unspecified: Secondary | ICD-10-CM

## 2015-08-02 DIAGNOSIS — H6592 Unspecified nonsuppurative otitis media, left ear: Secondary | ICD-10-CM

## 2015-08-02 MED ORDER — FLUTICASONE PROPIONATE 50 MCG/ACT NA SUSP: 2.0000 | Freq: Every day | NASAL | Status: DC

## 2015-08-02 MED ORDER — AZITHROMYCIN 250 MG PO TABS: ORAL_TABLET | ORAL | Status: DC

## 2015-08-02 NOTE — Progress Notes (Addendum)
BP 130/87 mmHg  Pulse 85  Temp(Src) 97.5 F (36.4 C) (Oral)  Ht 5\' 10"  (1.778 m)  Wt 204 lb 9.6 oz (92.806 kg)  BMI 29.36 kg/m2   Subjective:    Patient ID: Jeffrey [name], male    DOB: 19-May-1956, 59 y.o.   MRN: [id number]  HPI: Jeffrey [name] is a 59 y.o. male presenting on 08/02/2015 for Cough; Nasal Congestion; and Headache   HPI Cough and sore throat Patient has been having cough and sore throat for the past 4 days. His cough is productive of yellow-white sputum and is worse in the early morning and at night. He also complains of a little bit of sinus pressure and postnasal drainage and nasal congestion and chest congestion. He is attempting to use antihistamines without much excess. He denies fevers or chills. He denies shortness of breath or wheezing. He denies any sick contacts as he lives alone. He denies any pain or pressure in his ears  Relevant past medical, surgical, family and social history reviewed and updated as indicated. Interim medical history since our last visit reviewed. Allergies and medications reviewed and updated.  Review of Systems  Constitutional: Positive for fever. Negative for chills.  HENT: Positive for congestion, postnasal drip, rhinorrhea, sinus pressure, sneezing and sore throat. Negative for ear discharge, ear pain and voice change.   Eyes: Negative for pain, discharge, redness and visual disturbance.  Respiratory: Positive for cough. Negative for shortness of breath and wheezing.   Cardiovascular: Negative for chest pain and leg swelling.  Gastrointestinal: Negative for abdominal pain, diarrhea and constipation.  Genitourinary: Negative for difficulty urinating.  Musculoskeletal: Negative for back pain and gait problem.  Skin: Negative for rash.  Neurological: Negative for syncope, light-headedness and headaches.  All other systems reviewed and are negative.   Per HPI unless specifically indicated above     Medication List       This list  is accurate as of: 08/02/15  8:36 AM.  Always use your most recent med list.               amLODipine 5 MG tablet  Commonly known as:  NORVASC  Take 1 tablet (5 mg total) by mouth every morning.     azithromycin 250 MG tablet  Commonly known as:  ZITHROMAX  Take 2 the first day and then one each day after.     fluticasone 50 MCG/ACT nasal spray  Commonly known as:  FLONASE  Place 2 sprays into both nostrils daily.     lisinopril 10 MG tablet  Commonly known as:  PRINIVIL,ZESTRIL  TAKE 1 TABLET BY MOUTH EVERY MORNING           Objective:    BP 130/87 mmHg  Pulse 85  Temp(Src) 97.5 F (36.4 C) (Oral)  Ht 5\' 10"  (1.778 m)  Wt 204 lb 9.6 oz (92.806 kg)  BMI 29.36 kg/m2  Wt Readings from Last 3 Encounters:  08/02/15 204 lb 9.6 oz (92.806 kg)  06/08/15 209 lb (94.802 kg)  03/13/15 204 lb (92.534 kg)    Physical Exam  Constitutional: He is oriented to person, place, and time. He appears well-developed and well-nourished. No distress.  HENT:  Right Ear: Tympanic membrane, external ear and ear canal normal.  Left Ear: External ear and ear canal normal. No drainage, swelling or tenderness. Tympanic membrane is injected, erythematous and bulging. Tympanic membrane is not retracted. A middle ear effusion is present. No decreased hearing is noted.  Nose: Mucosal edema and rhinorrhea present. No sinus tenderness. No epistaxis. Right sinus exhibits maxillary sinus tenderness. Right sinus exhibits no frontal sinus tenderness. Left sinus exhibits maxillary sinus tenderness. Left sinus exhibits no frontal sinus tenderness.  Mouth/Throat: Uvula is midline and mucous membranes are normal. Posterior oropharyngeal edema and posterior oropharyngeal erythema present. No oropharyngeal exudate or tonsillar abscesses.  Eyes: Conjunctivae and EOM are normal. Pupils are equal, round, and reactive to light. Right eye exhibits no discharge. No scleral icterus.  Neck: Neck supple. No thyromegaly  present.  Cardiovascular: Normal rate, regular rhythm, normal heart sounds and intact distal pulses.   No murmur heard. Pulmonary/Chest: Effort normal and breath sounds normal. No respiratory distress. He has no wheezes.  Musculoskeletal: Normal range of motion. He exhibits no edema.  Lymphadenopathy:    He has no cervical adenopathy.  Neurological: He is alert and oriented to person, place, and time. Coordination normal.  Skin: Skin is warm and dry. No rash noted. He is not diaphoretic.  Psychiatric: He has a normal mood and affect. His behavior is normal.  Vitals reviewed.   Results for orders placed or performed during the hospital encounter of 03/13/15  CBC  Result Value Ref Range   WBC 5.1 4.0 - 10.5 K/uL   RBC 5.16 4.22 - 5.81 MIL/uL   Hemoglobin 15.0 13.0 - 17.0 g/dL   HCT 42.6 39.0 - 52.0 %   MCV 82.6 78.0 - 100.0 fL   MCH 29.1 26.0 - 34.0 pg   MCHC 35.2 30.0 - 36.0 g/dL   RDW 12.8 11.5 - 15.5 %   Platelets 200 150 - 400 K/uL  Basic metabolic panel  Result Value Ref Range   Sodium 139 135 - 145 mmol/L   Potassium 3.9 3.5 - 5.1 mmol/L   Chloride 108 101 - 111 mmol/L   CO2 24 22 - 32 mmol/L   Glucose, Bld 108 (H) 65 - 99 mg/dL   BUN 17 6 - 20 mg/dL   Creatinine, Ser 1.01 0.61 - 1.24 mg/dL   Calcium 9.5 8.9 - 10.3 mg/dL   GFR calc non Af Amer >60 >60 mL/min   GFR calc Af Amer >60 >60 mL/min   Anion gap 7 5 - 15      Assessment & Plan:   Problem List Items Addressed This Visit    None    Visit Diagnoses    Left otitis media with effusion    -  Primary    Relevant Medications    azithromycin (ZITHROMAX) 250 MG tablet    Acute rhinosinusitis        Relevant Medications    fluticasone (FLONASE) 50 MCG/ACT nasal spray    azithromycin (ZITHROMAX) 250 MG tablet        Follow up plan: Return if symptoms worsen or fail to improve.  Caryl Pina, MD [facility] 08/02/2015, 8:36 AM

## 2015-08-31 ENCOUNTER — Telehealth: Payer: Self-pay | Admitting: Family Medicine

## 2015-08-31 DIAGNOSIS — H6592 Unspecified nonsuppurative otitis media, left ear: Secondary | ICD-10-CM

## 2015-08-31 MED ORDER — AZITHROMYCIN 250 MG PO TABS: ORAL_TABLET | ORAL | Status: DC

## 2015-08-31 NOTE — Telephone Encounter (Signed)
Go ahead and send a Z-Pak, also recommended to use Flonase and an antihistamine

## 2015-08-31 NOTE — Telephone Encounter (Signed)
Last seen by you on 08-02-15 for sinus.  Please advise on script for antibiotic.

## 2015-08-31 NOTE — Telephone Encounter (Signed)
Patient aware of new script

## 2015-09-04 ENCOUNTER — Encounter: Payer: Self-pay | Admitting: Family

## 2015-09-04 ENCOUNTER — Ambulatory Visit (INDEPENDENT_AMBULATORY_CARE_PROVIDER_SITE_OTHER): Payer: 59 | Admitting: Family

## 2015-09-04 VITALS — BP 119/79 | HR 80 | Temp 98.0°F | Ht 70.0 in | Wt 202.6 lb

## 2015-09-04 DIAGNOSIS — J111 Influenza due to unidentified influenza virus with other respiratory manifestations: Secondary | ICD-10-CM | POA: Diagnosis not present

## 2015-09-04 DIAGNOSIS — R05 Cough: Secondary | ICD-10-CM | POA: Diagnosis not present

## 2015-09-04 DIAGNOSIS — R059 Cough, unspecified: Secondary | ICD-10-CM

## 2015-09-04 MED ORDER — METHYLPREDNISOLONE ACETATE 80 MG/ML IJ SUSP
80.0000 mg | Freq: Once | INTRAMUSCULAR | Status: AC
  Administered 2015-09-04: 80 mg via INTRAMUSCULAR

## 2015-09-04 MED ORDER — BENZONATATE 200 MG PO CAPS: 200.0000 mg | ORAL_CAPSULE | Freq: Three times a day (TID) | ORAL | Status: DC | PRN

## 2015-09-04 NOTE — Progress Notes (Signed)
   Subjective:    Patient ID: Jeffrey [name], male    DOB: 1956-07-04, 58 y.o.   MRN: [id number]  HPI Pt presents to the office today for the Flu. Pt states he went to the Urgent Care on Saturday and was positive for the Flu. Pt was started on Tamiflu and is his last dose is on Wednesday. Pt states he is still having congestion and wheezing. Pt denies any fever or body aches at this time. Pt states he is worried because he is scheduled to have cataract surgery this Friday.    Review of Systems  Constitutional: Negative.   HENT: Negative.   Respiratory: Negative.   Cardiovascular: Negative.   Gastrointestinal: Negative.   Endocrine: Negative.   Genitourinary: Negative.   Musculoskeletal: Negative.   Neurological: Negative.   Hematological: Negative.   Psychiatric/Behavioral: Negative.   All other systems reviewed and are negative.      Objective:   Physical Exam  Constitutional: He is oriented to person, place, and time. He appears well-developed and well-nourished. No distress.  HENT:  Head: Normocephalic.  Right Ear: External ear normal.  Left Ear: External ear normal.  Nasal passage erythemas with mild swelling  Oropharynx erythemas  Eyes: Pupils are equal, round, and reactive to light. Right eye exhibits no discharge. Left eye exhibits no discharge.  Neck: Normal range of motion. Neck supple. No thyromegaly present.  Cardiovascular: Normal rate, regular rhythm, normal heart sounds and intact distal pulses.   No murmur heard. Pulmonary/Chest: Effort normal and breath sounds normal. No respiratory distress. He has no wheezes.  Coarse cough  Abdominal: Soft. Bowel sounds are normal. He exhibits no distension. There is no tenderness.  Musculoskeletal: Normal range of motion. He exhibits no edema or tenderness.  Neurological: He is alert and oriented to person, place, and time. He has normal reflexes. No cranial nerve deficit.  Skin: Skin is warm and dry. No rash noted. No  erythema.  Psychiatric: He has a normal mood and affect. His behavior is normal. Judgment and thought content normal.  Vitals reviewed.     BP 119/79 mmHg  Pulse 80  Temp(Src) 98 F (36.7 C) (Oral)  Ht 5\' 10"  (1.778 m)  Wt 202 lb 9.6 oz (91.899 kg)  BMI 29.07 kg/m2     Assessment & Plan:  1. Flu -Rest -Continue Tamiflu -Force fluids -Tylenol prn for pain or fever  2. Cough -- Take meds as prescribed - Use a cool mist humidifier  -Use saline nose sprays frequently -Saline irrigations of the nose can be very helpful if done frequently.  * 4X daily for 1 week*  * Use of a nettie pot can be helpful with this. Follow directions with this* -Force fluids -For any cough or congestion  Use plain Mucinex- regular strength or max strength is fine   * Children- consult with Pharmacist for dosing -For fever or aces or pains- take tylenol or ibuprofen appropriate for age and weight.  * for fevers greater than 101 orally you may alternate ibuprofen and tylenol every  3 hours. -Throat lozenges if help - methylPREDNISolone acetate (DEPO-MEDROL) injection 80 mg; Inject 1 mL (80 mg total) into the muscle once.  Evelina Dun, FNP

## 2015-09-04 NOTE — Patient Instructions (Addendum)
Influenza, Adult Influenza ("the flu") is a viral infection of the respiratory tract. It occurs more often in winter months because people spend more time in close contact with one another. Influenza can make you feel very sick. Influenza easily spreads from person to person (contagious). CAUSES  Influenza is caused by a virus that infects the respiratory tract. You can catch the virus by breathing in droplets from an infected person's cough or sneeze. You can also catch the virus by touching something that was recently contaminated with the virus and then touching your mouth, nose, or eyes. RISKS AND COMPLICATIONS You may be at risk for a more severe case of influenza if you smoke cigarettes, have diabetes, have chronic heart disease (such as heart failure) or lung disease (such as asthma), or if you have a weakened immune system. Elderly people and pregnant women are also at risk for more serious infections. The most common problem of influenza is a lung infection (pneumonia). Sometimes, this problem can require emergency medical care and may be life threatening. SIGNS AND SYMPTOMS  Symptoms typically last 4 to 10 days and may include:  Fever.  Chills.  Headache, body aches, and muscle aches.  Sore throat.  Chest discomfort and cough.  Poor appetite.  Weakness or feeling tired.  Dizziness.  Nausea or vomiting. DIAGNOSIS  Diagnosis of influenza is often made based on your history and a physical exam. A nose or throat swab test can be done to confirm the diagnosis. TREATMENT  In mild cases, influenza goes away on its own. Treatment is directed at relieving symptoms. For more severe cases, your health care provider may prescribe antiviral medicines to shorten the sickness. Antibiotic medicines are not effective because the infection is caused by a virus, not by bacteria. HOME CARE INSTRUCTIONS  Take medicines only as directed by your health care provider.  Use a cool mist humidifier  to make breathing easier.  Get plenty of rest until your temperature returns to normal. This usually takes 3 to 4 days.  Drink enough fluid to keep your urine clear or pale yellow.  Cover yourmouth and nosewhen coughing or sneezing,and wash your handswellto prevent thevirusfrom spreading.  Stay homefromwork orschool untilthe fever is gonefor at least 41full day. PREVENTION  An annual influenza vaccination (flu shot) is the best way to avoid getting influenza. An annual flu shot is now routinely recommended for all adults in the [place] IF:  You experiencechest pain, yourcough worsens,or you producemore mucus.  Youhave nausea,vomiting, ordiarrhea.  Your fever returns or gets worse. SEEK IMMEDIATE MEDICAL CARE IF:  You havetrouble breathing, you become short of breath,or your skin ornails becomebluish.  You have severe painor stiffnessin the neck.  You develop a sudden headache, or pain in the face or ear.  You have nausea or vomiting that you cannot control. MAKE SURE YOU:   Understand these instructions.  Will watch your condition.  Will get help right away if you are not doing well or get worse.   This information is not intended to replace advice given to you by your health care provider. Make sure you discuss any questions you have with your health care provider.   Document Released: 09/06/2000 Document Revised: 09/30/2014 Document Reviewed: 12/09/2011 Elsevier Interactive Patient Education 2016 [place] meds as prescribed - Use a cool mist humidifier  -Use saline nose sprays frequently -Saline irrigations of the nose can be very helpful if done frequently.  * 4X daily for  1 week*  * Use of a nettie pot can be helpful with this. Follow directions with this* -Force fluids -For any cough or congestion  Use plain Mucinex- regular strength or max strength is fine   * Children- consult with Pharmacist for  dosing -For fever or aces or pains- take tylenol or ibuprofen appropriate for age and weight.  * for fevers greater than 101 orally you may alternate ibuprofen and tylenol every  3 hours. -Throat lozenges if help    Jeffrey Dun, FNP

## 2015-09-04 NOTE — Addendum Note (Signed)
Addended by: Evelina Dun A on: 09/04/2015 12:54 PM   Modules accepted: Orders

## 2015-09-29 ENCOUNTER — Encounter: Payer: Self-pay | Admitting: Family Medicine

## 2015-09-29 ENCOUNTER — Ambulatory Visit (INDEPENDENT_AMBULATORY_CARE_PROVIDER_SITE_OTHER): Payer: 59 | Admitting: Family Medicine

## 2015-09-29 VITALS — BP 129/77 | HR 73 | Temp 97.4°F | Ht 70.0 in | Wt 203.0 lb

## 2015-09-29 DIAGNOSIS — H6501 Acute serous otitis media, right ear: Secondary | ICD-10-CM

## 2015-09-29 DIAGNOSIS — J0100 Acute maxillary sinusitis, unspecified: Secondary | ICD-10-CM

## 2015-09-29 MED ORDER — LEVOFLOXACIN 500 MG PO TABS: 500.0000 mg | ORAL_TABLET | Freq: Every day | ORAL | Status: DC

## 2015-09-29 MED ORDER — PSEUDOEPHEDRINE-GUAIFENESIN ER 60-600 MG PO TB12: 1.0000 | ORAL_TABLET | Freq: Two times a day (BID) | ORAL | Status: AC
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
## 2015-10-02 ENCOUNTER — Ambulatory Visit: Payer: 59 | Admitting: Family Medicine

## 2015-10-05 ENCOUNTER — Encounter: Payer: Self-pay | Admitting: Internal Medicine

## 2016-01-09 ENCOUNTER — Ambulatory Visit: Payer: 59 | Admitting: Psychology

## 2016-01-16 ENCOUNTER — Ambulatory Visit: Payer: 59 | Admitting: Psychology

## 2016-01-30 ENCOUNTER — Ambulatory Visit (INDEPENDENT_AMBULATORY_CARE_PROVIDER_SITE_OTHER): Payer: 59 | Admitting: Psychology

## 2016-01-30 DIAGNOSIS — F4323 Adjustment disorder with mixed anxiety and depressed mood: Secondary | ICD-10-CM | POA: Diagnosis not present

## 2016-02-06 ENCOUNTER — Ambulatory Visit (INDEPENDENT_AMBULATORY_CARE_PROVIDER_SITE_OTHER): Payer: 59 | Admitting: Psychology

## 2016-02-06 DIAGNOSIS — F4323 Adjustment disorder with mixed anxiety and depressed mood: Secondary | ICD-10-CM

## 2016-02-13 ENCOUNTER — Ambulatory Visit: Payer: 59 | Admitting: Psychology

## 2016-02-20 ENCOUNTER — Ambulatory Visit: Payer: 59 | Admitting: Psychology

## 2016-03-05 ENCOUNTER — Ambulatory Visit (INDEPENDENT_AMBULATORY_CARE_PROVIDER_SITE_OTHER): Payer: 59 | Admitting: Psychology

## 2016-03-05 DIAGNOSIS — F4323 Adjustment disorder with mixed anxiety and depressed mood: Secondary | ICD-10-CM

## 2016-07-05 ENCOUNTER — Encounter: Payer: Self-pay | Admitting: Family Medicine

## 2016-07-05 ENCOUNTER — Ambulatory Visit (INDEPENDENT_AMBULATORY_CARE_PROVIDER_SITE_OTHER): Payer: 59 | Admitting: Family Medicine

## 2016-07-05 VITALS — BP 138/86 | HR 66 | Ht 70.0 in | Wt 200.5 lb

## 2016-07-05 DIAGNOSIS — I10 Essential (primary) hypertension: Secondary | ICD-10-CM | POA: Diagnosis not present

## 2016-07-05 DIAGNOSIS — R0683 Snoring: Secondary | ICD-10-CM

## 2016-07-05 LAB — URINALYSIS
BILIRUBIN UA: NEGATIVE
GLUCOSE, UA: NEGATIVE
Ketones, UA: NEGATIVE
Leukocytes, UA: NEGATIVE
Nitrite, UA: NEGATIVE
PROTEIN UA: NEGATIVE
RBC UA: NEGATIVE
SPEC GRAV UA: 1.02 (ref 1.005–1.030)
Urobilinogen, Ur: 0.2 mg/dL (ref 0.2–1.0)
pH, UA: 5.5 (ref 5.0–7.5)
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
## 2016-07-06 LAB — CBC WITH DIFFERENTIAL/PLATELET
BASOS: 1 %
Basophils Absolute: 0.1 10*3/uL (ref 0.0–0.2)
EOS (ABSOLUTE): 0.2 10*3/uL (ref 0.0–0.4)
Eos: 3 %
HEMATOCRIT: 45.3 % (ref 37.5–51.0)
Hemoglobin: 15.3 g/dL (ref 12.6–17.7)
Immature Grans (Abs): 0 10*3/uL (ref 0.0–0.1)
Immature Granulocytes: 0 %
LYMPHS ABS: 1.9 10*3/uL (ref 0.7–3.1)
Lymphs: 28 %
MCH: 28.1 pg (ref 26.6–33.0)
MCHC: 33.8 g/dL (ref 31.5–35.7)
MCV: 83 fL (ref 79–97)
MONOS ABS: 0.8 10*3/uL (ref 0.1–0.9)
Monocytes: 12 %
NEUTROS ABS: 3.9 10*3/uL (ref 1.4–7.0)
Neutrophils: 56 %
PLATELETS: 263 10*3/uL (ref 150–379)
RBC: 5.45 x10E6/uL (ref 4.14–5.80)
RDW: 13.2 % (ref 12.3–15.4)
WBC: 6.8 10*3/uL (ref 3.4–10.8)

## 2016-07-06 LAB — CMP14+EGFR
A/G RATIO: 1.9 (ref 1.2–2.2)
ALT: 24 IU/L (ref 0–44)
AST: 18 IU/L (ref 0–40)
Albumin: 4.4 g/dL (ref 3.6–4.8)
Alkaline Phosphatase: 75 IU/L (ref 39–117)
BILIRUBIN TOTAL: 0.6 mg/dL (ref 0.0–1.2)
BUN/Creatinine Ratio: 13 (ref 10–24)
BUN: 16 mg/dL (ref 8–27)
CHLORIDE: 101 mmol/L (ref 96–106)
CO2: 25 mmol/L (ref 18–29)
Calcium: 9.4 mg/dL (ref 8.6–10.2)
Creatinine, Ser: 1.2 mg/dL (ref 0.76–1.27)
GFR calc non Af Amer: 65 mL/min/{1.73_m2} (ref >59)
GFR, EST AFRICAN AMERICAN: 76 mL/min/{1.73_m2} (ref >59)
GLOBULIN, TOTAL: 2.3 g/dL (ref 1.5–4.5)
Glucose: 105 mg/dL — ABNORMAL HIGH (ref 65–99)
POTASSIUM: 4.7 mmol/L (ref 3.5–5.2)
SODIUM: 141 mmol/L (ref 134–144)
TOTAL PROTEIN: 6.7 g/dL (ref 6.0–8.5)

## 2016-07-06 LAB — LIPID PANEL
CHOL/HDL RATIO: 3.7 ratio (ref 0.0–5.0)
Cholesterol, Total: 175 mg/dL (ref 100–199)
HDL: 47 mg/dL (ref >39)
LDL Calculated: 113 mg/dL — ABNORMAL HIGH (ref 0–99)
Triglycerides: 77 mg/dL (ref 0–149)
VLDL Cholesterol Cal: 15 mg/dL (ref 5–40)

## 2016-07-08 ENCOUNTER — Institutional Professional Consult (permissible substitution): Payer: 59 | Admitting: Neurology

## 2016-07-10 ENCOUNTER — Encounter: Payer: Self-pay | Admitting: Neurology

## 2016-08-28 ENCOUNTER — Ambulatory Visit (INDEPENDENT_AMBULATORY_CARE_PROVIDER_SITE_OTHER): Payer: 59 | Admitting: Nurse Practitioner

## 2016-08-28 ENCOUNTER — Encounter: Payer: Self-pay | Admitting: Nurse Practitioner

## 2016-08-28 VITALS — BP 144/97 | HR 98 | Temp 98.1°F | Ht 70.0 in | Wt 205.8 lb

## 2016-08-28 DIAGNOSIS — J0101 Acute recurrent maxillary sinusitis: Secondary | ICD-10-CM

## 2016-08-28 MED ORDER — PREDNISONE 10 MG (21) PO TBPK: ORAL_TABLET | ORAL | 0 refills | Status: DC

## 2016-08-28 MED ORDER — AMOXICILLIN 875 MG PO TABS: 875.0000 mg | ORAL_TABLET | Freq: Two times a day (BID) | ORAL | 0 refills | Status: DC

## 2016-08-28 NOTE — Patient Instructions (Signed)
Back Exercises Introduction If you have pain in your back, do these exercises 2-3 times each day or as told by your doctor. When the pain goes away, do the exercises once each day, but repeat the steps more times for each exercise (do more repetitions). If you do not have pain in your back, do these exercises once each day or as told by your doctor. Exercises Single Knee to Chest  Do these steps 3-5 times in a row for each leg: 1. Lie on your back on a firm bed or the floor with your legs stretched out. 2. Bring one knee to your chest. 3. Hold your knee to your chest by grabbing your knee or thigh. 4. Pull on your knee until you feel a gentle stretch in your lower back. 5. Keep doing the stretch for 10-30 seconds. 6. Slowly let go of your leg and straighten it. Pelvic Tilt  Do these steps 5-10 times in a row: 1. Lie on your back on a firm bed or the floor with your legs stretched out. 2. Bend your knees so they point up to the ceiling. Your feet should be flat on the floor. 3. Tighten your lower belly (abdomen) muscles to press your lower back against the floor. This will make your tailbone point up to the ceiling instead of pointing down to your feet or the floor. 4. Stay in this position for 5-10 seconds while you gently tighten your muscles and breathe evenly. Cat-Cow  Do these steps until your lower back bends more easily: 1. Get on your hands and knees on a firm surface. Keep your hands under your shoulders, and keep your knees under your hips. You may put padding under your knees. 2. Let your head hang down, and make your tailbone point down to the floor so your lower back is round like the back of a cat. 3. Stay in this position for 5 seconds. 4. Slowly lift your head and make your tailbone point up to the ceiling so your back hangs low (sags) like the back of a cow. 5. Stay in this position for 5 seconds. Press-Ups  Do these steps 5-10 times in a row: 1. Lie on your belly  (face-down) on the floor. 2. Place your hands near your head, about shoulder-width apart. 3. While you keep your back relaxed and keep your hips on the floor, slowly straighten your arms to raise the top half of your body and lift your shoulders. Do not use your back muscles. To make yourself more comfortable, you may change where you place your hands. 4. Stay in this position for 5 seconds. 5. Slowly return to lying flat on the floor. Bridges  Do these steps 10 times in a row: 1. Lie on your back on a firm surface. 2. Bend your knees so they point up to the ceiling. Your feet should be flat on the floor. 3. Tighten your butt muscles and lift your butt off of the floor until your waist is almost as high as your knees. If you do not feel the muscles working in your butt and the back of your thighs, slide your feet 1-2 inches farther away from your butt. 4. Stay in this position for 3-5 seconds. 5. Slowly lower your butt to the floor, and let your butt muscles relax. If this exercise is too easy, try doing it with your arms crossed over your chest. Belly Crunches  Do these steps 5-10 times in a row: 1. Lie   on your back on a firm bed or the floor with your legs stretched out. 2. Bend your knees so they point up to the ceiling. Your feet should be flat on the floor. 3. Cross your arms over your chest. 4. Tip your chin a little bit toward your chest but do not bend your neck. 5. Tighten your belly muscles and slowly raise your chest just enough to lift your shoulder blades a tiny bit off of the floor. 6. Slowly lower your chest and your head to the floor. Back Lifts  Do these steps 5-10 times in a row: 1. Lie on your belly (face-down) with your arms at your sides, and rest your forehead on the floor. 2. Tighten the muscles in your legs and your butt. 3. Slowly lift your chest off of the floor while you keep your hips on the floor. Keep the back of your head in line with the curve in your back.  Look at the floor while you do this. 4. Stay in this position for 3-5 seconds. 5. Slowly lower your chest and your face to the floor. Contact a doctor if:  Your back pain gets a lot worse when you do an exercise.  Your back pain does not lessen 2 hours after you exercise. If you have any of these problems, stop doing the exercises. Do not do them again unless your doctor says it is okay. Get help right away if:  You have sudden, very bad back pain. If this happens, stop doing the exercises. Do not do them again unless your doctor says it is okay. This information is not intended to replace advice given to you by your health care provider. Make sure you discuss any questions you have with your health care provider. Document Released: 10/12/2010 Document Revised: 02/15/2016 Document Reviewed: 11/03/2014  2017 [employer]vier  

## 2016-08-28 NOTE — Progress Notes (Signed)
Subjective:     Jeffrey [name] is a 60 y.o. male who presents for evaluation of sinus pain. Symptoms include: congestion, facial pain, nasal congestion, post nasal drip and sinus pressure. Onset of symptoms was 4 days ago. Symptoms have been unchanged since that time. Past history is significant for occasional episodes of bronchitis. Patient is a non-smoker.  The following portions of the patient's history were reviewed and updated as appropriate: allergies, current medications, past family history, past medical history, past social history, past surgical history and problem list.  * he is also c/o pain down left leg- started 2 weeks ago- has been taking ibuprofen and tylenol- has had several times in the past.  Review of Systems Pertinent items noted in HPI and remainder of comprehensive ROS otherwise negative.   Objective:    BP (!) 144/97 (BP Location: Right Arm, Patient Position: Sitting, Cuff Size: Large)   Pulse 98   Temp 98.1 F (36.7 C) (Oral)   Ht 5\' 10"  (1.778 m)   Wt 205 lb 12.8 oz (93.4 kg)   BMI 29.53 kg/m  General appearance: alert, cooperative and mild distress Eyes: conjunctivae/corneas clear. PERRL, EOM's intact. Fundi benign. Ears: normal TM's and external ear canals both ears Nose: clear discharge, moderate congestion, turbinates red, sinus tenderness bilateral Throat: lips, mucosa, and tongue normal; teeth and gums normal Neck: no adenopathy, no carotid bruit, no JVD, supple, symmetrical, trachea midline and thyroid not enlarged, symmetric, no tenderness/mass/nodules Lungs: clear to auscultation bilaterally Heart: regular rate and rhythm, S1, S2 normal, no murmur, click, rub or gallop Extremities: FROM  of motion lumbar spine with pain doen left leg- (+) SLR on left at 45 degrees.    Assessment:    Acute bacterial sinusitis Low back pain with sciatica.    Plan:     1. Take meds as prescribed 2. Use a cool mist humidifier especially during the winter months and  when heat has been humid. 3. Use saline nose sprays frequently 4. Saline irrigations of the nose can be very helpful if done frequently.  * 4X daily for 1 week*  * Use of a nettie pot can be helpful with this. Follow directions with this* 5. Drink plenty of fluids 6. Keep thermostat turn down low 7.For any cough or congestion  Use plain Mucinex- regular strength or max strength is fine   * Children- consult with Pharmacist for dosing 8. For fever or aces or pains- take tylenol or ibuprofen appropriate for age and weight.  * for fevers greater than 101 orally you may alternate ibuprofen and tylenol every  3 hours.   Meds ordered this encounter  Medications  . amoxicillin (AMOXIL) 875 MG tablet    Sig: Take 1 tablet (875 mg total) by mouth 2 (two) times daily. 1 po BID    Dispense:  20 tablet    Refill:  0    Order Specific Question:   Supervising Provider    Answer:   VINCENT, CAROL L [[id number]]   Moist heat to back Back stretches.  depomedrol dose pack as directed  Mary-Margaret Hassell Done, FNP

## 2016-09-17 ENCOUNTER — Ambulatory Visit (INDEPENDENT_AMBULATORY_CARE_PROVIDER_SITE_OTHER): Payer: 59 | Admitting: Pediatrics

## 2016-09-17 ENCOUNTER — Encounter: Payer: Self-pay | Admitting: Pediatrics

## 2016-09-17 VITALS — BP 144/103 | HR 81 | Temp 97.5°F | Ht 70.0 in | Wt 201.6 lb

## 2016-09-17 DIAGNOSIS — I10 Essential (primary) hypertension: Secondary | ICD-10-CM

## 2016-09-17 MED ORDER — AMLODIPINE BESYLATE 5 MG PO TABS: 5.0000 mg | ORAL_TABLET | Freq: Every morning | ORAL | 10 refills | Status: DC

## 2016-09-17 NOTE — Progress Notes (Signed)
  Subjective:   Patient ID: Jeffrey [name], male    DOB: 21-Oct-1955, 60 y.o.   MRN: [id number] CC: Hypertension (x 2 weeks. patient also states that he has been having headahes in the morning.)  HPI: Jeffrey [name] is a 60 y.o. male presenting for Hypertension (x 2 weeks. patient also states that he has been having headahes in the morning.)  BPs at home regularly over 140s-150s/90s-100s Was taken off of BP meds   Out of meds about 4-5 months Regularly having head aches in morning, dont wake him up Has not yet had sleep study done He wants to get his BP in better place first  Relevant past medical, surgical, family and social history reviewed. Allergies and medications reviewed and updated. History  Smoking Status  . Never Smoker  Smokeless Tobacco  . Never Used   ROS: Per HPI   Objective:    BP (!) 144/103   Pulse 81   Temp 97.5 F (36.4 C) (Oral)   Ht 5\' 10"  (1.778 m)   Wt 201 lb 9.6 oz (91.4 kg)   BMI 28.93 kg/m   Wt Readings from Last 3 Encounters:  09/17/16 201 lb 9.6 oz (91.4 kg)  08/28/16 205 lb 12.8 oz (93.4 kg)  07/05/16 200 lb 8 oz (90.9 kg)    Gen: NAD, alert, cooperative with exam, NCAT EYES: EOMI, no conjunctival injection, or no icterus ENT:  TMs pearly gray b/l, OP without erythema LYMPH: no cervical LAD CV: NRRR, normal S1/S2, no murmur Resp: CTABL, no wheezes, normal WOB Abd: +BS, soft, NTND. no guarding or organomegaly Ext: No edema, warm Neuro: Alert and oriented, strength equal b/l UE and LE, coordination grossly normal MSK: normal muscle bulk  Assessment & Plan:  Mekhai was seen today for hypertension.  Diagnoses and all orders for this visit:  Essential hypertension Restart amlodipine Check BPs at home, let me know if elevated F/u 4 weeks, recheck labs then -     amLODipine (NORVASC) 5 MG tablet; Take 1 tablet (5 mg total) by mouth every morning.   Follow up plan: Return in about 4 weeks (around 10/15/2016) for med follow up. Assunta Found,  MD [place]

## 2016-12-02 ENCOUNTER — Ambulatory Visit: Payer: 59 | Admitting: Pediatrics

## 2016-12-02 ENCOUNTER — Encounter: Payer: Self-pay | Admitting: Pediatrics

## 2016-12-02 ENCOUNTER — Other Ambulatory Visit: Payer: Self-pay | Admitting: Family Medicine

## 2016-12-02 ENCOUNTER — Ambulatory Visit (INDEPENDENT_AMBULATORY_CARE_PROVIDER_SITE_OTHER): Payer: 59 | Admitting: Pediatrics

## 2016-12-02 VITALS — BP 130/84 | HR 97 | Temp 99.0°F | Resp 20 | Ht 70.0 in | Wt 208.0 lb

## 2016-12-02 DIAGNOSIS — I10 Essential (primary) hypertension: Secondary | ICD-10-CM | POA: Diagnosis not present

## 2016-12-02 DIAGNOSIS — J069 Acute upper respiratory infection, unspecified: Secondary | ICD-10-CM

## 2016-12-02 DIAGNOSIS — J019 Acute sinusitis, unspecified: Secondary | ICD-10-CM

## 2016-12-02 MED ORDER — AMLODIPINE BESYLATE 10 MG PO TABS: 10.0000 mg | ORAL_TABLET | Freq: Every morning | ORAL | 1 refills | Status: DC

## 2016-12-02 NOTE — Progress Notes (Signed)
  Subjective:   Patient ID: Jeffrey [name], male    DOB: Aug 22, 1956, 61 y.o.   MRN: [id number] CC: Cough and Chest Congestion  HPI: Jeffrey [name] is a 61 y.o. male presenting for Cough and Chest Congestion  Started coughing a couple days ago Clear at first, a little thicker this morning No ear pain No fevers at home Minimal congestion appetite normal Has flonase at home, not taking  HTN: taking amlodipine at home BPs 135-140/80-85 Once DBP was 75 Rarely seeing 120s No CP, no HA, no SOB   Relevant past medical, surgical, family and social history reviewed. Allergies and medications reviewed and updated. History  Smoking Status  . Never Smoker  Smokeless Tobacco  . Never Used   ROS: Per HPI   Objective:    BP 130/84   Pulse 97   Temp 99 F (37.2 C) (Oral)   Resp 20   Ht 5\' 10"  (1.778 m)   Wt 208 lb (94.3 kg)   SpO2 98%   BMI 29.84 kg/m   Wt Readings from Last 3 Encounters:  12/02/16 208 lb (94.3 kg)  09/17/16 201 lb 9.6 oz (91.4 kg)  08/28/16 205 lb 12.8 oz (93.4 kg)    Gen: NAD, alert, cooperative with exam, NCAT EYES: EOMI, no conjunctival injection, or no icterus ENT:  R TM gray b/l, L TM with central white scar, minimal red injected, OP without erythema, no ttp over sinuses b/l LYMPH: no cervical LAD CV: NRRR, normal S1/S2, no murmur, distal pulses 2+ b/l Resp: CTABL, no wheezes, normal WOB Ext: No edema, warm Neuro: Alert and oriented, strength equal b/l UE and LE, coordination grossly normal MSK: normal muscle bulk  Assessment & Plan:  [name] was seen today for cough and chest congestion.  Diagnoses and all orders for this visit:  Acute URI Discussed symptom care, return precautions  Essential hypertension Remains slightly elevated Will increase amlodipine to 10mg  Cont to check at home, let me know if stays elevated rtc 6 mo for blood work, recheck -     amLODipine (NORVASC) 10 MG tablet; Take 1 tablet (10 mg total) by mouth every  morning.   Follow up plan: Return in about 6 months (around 06/04/2017). Assunta Found, MD [place]

## 2016-12-02 NOTE — Patient Instructions (Addendum)
Netipot with distilled water 2-3 times a day to clear out sinuses Or Normal saline nasal spray Flonase steroid nasal spray Antihistamine daily such as cetirizine Ibuprofen 600mg  if needed Tylenol twice a day Lots of fluids

## 2016-12-04 ENCOUNTER — Telehealth: Payer: Self-pay | Admitting: Pediatrics

## 2016-12-04 NOTE — Telephone Encounter (Signed)
Patient seen Jeffrey [name] 3/12 for cough and chest congestion. Patient states he is worse and now is "having a hard time to take a deep breath". Patient denies any SOB or chest pain.  Patient states he knows Dr. Evette [name] thought it was viral but he did not want it to get worse and develop. Please advise

## 2016-12-04 NOTE — Telephone Encounter (Signed)
If he is having chest pain, difficulty breathing, shortness of breath, worsening fevers or wheezing he should be seen to make sure not a pneumonia. He told me last visit his symptoms started this past weekend, now sick apprx 5 days.

## 2016-12-04 NOTE — Telephone Encounter (Signed)
Pt was seen on Monday for uri... No better, having hard time to take a deep breath... plz call

## 2016-12-05 NOTE — Telephone Encounter (Signed)
Spoke to pt and advised of MD feedback and pt voiced understanding.

## 2016-12-06 ENCOUNTER — Telehealth: Payer: Self-pay | Admitting: Pediatrics

## 2016-12-06 ENCOUNTER — Other Ambulatory Visit: Payer: Self-pay | Admitting: Family Medicine

## 2016-12-06 MED ORDER — AMOXICILLIN-POT CLAVULANATE 875-125 MG PO TABS: 1.0000 | ORAL_TABLET | Freq: Two times a day (BID) | ORAL | 0 refills | Status: DC

## 2016-12-06 NOTE — Telephone Encounter (Signed)
What symptoms do you have? Congestion, coughing up green phlegm, he is not having shortness of breath  How long have you been sick? Since 11/30/2016   Have you been seen for this problem? Yes 12/02/2016   If your provider decides to give you a prescription, which pharmacy would you like for it to be sent to? [facility]   Patient informed that this information will be sent to the clinical staff for review and that they should receive a follow up call.

## 2016-12-06 NOTE — Telephone Encounter (Signed)
Sinus congestion worse since visit on 3/10 Scratchy throat, no fever Cough worse at night, green phlegm, no sob Using allegra & flonase

## 2016-12-06 NOTE — Telephone Encounter (Signed)
Pt aware abx sent in

## 2016-12-06 NOTE — Telephone Encounter (Signed)
Please contact the patient to let him know I sent in an antibiotic scrip to his pharmacy

## 2016-12-16 ENCOUNTER — Telehealth: Payer: Self-pay | Admitting: Family Medicine

## 2016-12-16 NOTE — Telephone Encounter (Signed)
What symptoms do you have? Cough up mucus, just finished antibiotic, has coughed up some blood. The mucus is clear now but lots of it.  How long have you been sick? Two weeks  Have you been seen for this problem? No. He called in and was given antibiotic.   If your provider decides to give you a prescription, which pharmacy would you [facility] for [facility] to be [facility] [place]   Patient informed that this information will be sent to the clinical staff for review and that they should receive a follow up call.

## 2016-12-16 NOTE — Telephone Encounter (Signed)
Appointment made 3/27 at 3:55 with [facility].

## 2016-12-16 NOTE — Telephone Encounter (Signed)
He should be seen. Thanks, WS

## 2016-12-17 ENCOUNTER — Ambulatory Visit (INDEPENDENT_AMBULATORY_CARE_PROVIDER_SITE_OTHER): Payer: 59 | Admitting: Family Medicine

## 2016-12-17 ENCOUNTER — Ambulatory Visit (INDEPENDENT_AMBULATORY_CARE_PROVIDER_SITE_OTHER): Payer: 59

## 2016-12-17 VITALS — BP 104/67 | HR 84 | Temp 98.4°F | Ht 70.0 in | Wt 206.0 lb

## 2016-12-17 DIAGNOSIS — R042 Hemoptysis: Secondary | ICD-10-CM | POA: Diagnosis not present

## 2016-12-17 DIAGNOSIS — J40 Bronchitis, not specified as acute or chronic: Secondary | ICD-10-CM

## 2016-12-17 MED ORDER — LEVOFLOXACIN 500 MG PO TABS: 500.0000 mg | ORAL_TABLET | Freq: Every day | ORAL | 0 refills | Status: DC

## 2016-12-17 MED ORDER — PREDNISONE 10 MG PO TABS: ORAL_TABLET | ORAL | 0 refills | Status: DC
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
## 2016-12-18 NOTE — Progress Notes (Signed)
Your chest x-ray looked normal. Thanks, WS.

## 2017-01-06 ENCOUNTER — Other Ambulatory Visit: Payer: Self-pay | Admitting: Family Medicine

## 2017-03-29 ENCOUNTER — Ambulatory Visit (INDEPENDENT_AMBULATORY_CARE_PROVIDER_SITE_OTHER): Payer: 59 | Admitting: Family

## 2017-03-29 ENCOUNTER — Encounter: Payer: Self-pay | Admitting: Family

## 2017-03-29 VITALS — BP 126/89 | HR 72 | Temp 98.5°F | Ht 70.0 in | Wt 210.0 lb

## 2017-03-29 DIAGNOSIS — E669 Obesity, unspecified: Secondary | ICD-10-CM

## 2017-03-29 DIAGNOSIS — I10 Essential (primary) hypertension: Secondary | ICD-10-CM | POA: Diagnosis not present

## 2017-03-29 DIAGNOSIS — R609 Edema, unspecified: Secondary | ICD-10-CM

## 2017-03-29 MED ORDER — LISINOPRIL 10 MG PO TABS: 10.0000 mg | ORAL_TABLET | Freq: Every day | ORAL | 3 refills | Status: DC

## 2017-03-29 MED ORDER — AMLODIPINE BESYLATE 5 MG PO TABS: 5.0000 mg | ORAL_TABLET | Freq: Every day | ORAL | 3 refills | Status: DC

## 2017-03-29 NOTE — Progress Notes (Signed)
   Subjective:    Patient ID: Jeffrey [name], male    DOB: 09-30-1955, 61 y.o.   MRN: [id number]  HPI Pt presents to the office today with bilateral swelling of his ankles that started about a month that is unchanged. PT states during the day he will notice swelling in bilateral ankles that will improve at night. PT is currently taking amlodipine 10 mg that was increased to 10 mg about 6 months ago.    Review of Systems  Cardiovascular: Positive for leg swelling.  All other systems reviewed and are negative.      Objective:   Physical Exam  Constitutional: He is oriented to person, place, and time. He appears well-developed and well-nourished. No distress.  HENT:  Head: Normocephalic.  Eyes: Pupils are equal, round, and reactive to light. Right eye exhibits no discharge. Left eye exhibits no discharge.  Neck: Normal range of motion. Neck supple. No thyromegaly present.  Cardiovascular: Normal rate, regular rhythm, normal heart sounds and intact distal pulses.   No murmur heard. Pulmonary/Chest: Effort normal and breath sounds normal. No respiratory distress. He has no wheezes.  Abdominal: Soft. Bowel sounds are normal. He exhibits no distension. There is no tenderness.  Musculoskeletal: Normal range of motion. He exhibits edema (trace in ankle). He exhibits no tenderness.  Neurological: He is alert and oriented to person, place, and time.  Skin: Skin is warm and dry. No rash noted. No erythema.  Psychiatric: He has a normal mood and affect. His behavior is normal. Judgment and thought content normal.  Vitals reviewed.    BP 126/89   Pulse 72   Temp 98.5 F (36.9 C) (Oral)   Ht _0  (1.778 m)   Wt 210 lb (95.3 kg)   BMI 30.13 kg/m      Assessment & Plan:  1. Essential hypertension - amLODipine (NORVASC) 5 MG tablet; Take 1 tablet (5 mg total) by mouth daily.  Dispense: 90 tablet; Refill: 3 - lisinopril (PRINIVIL,ZESTRIL) 10 MG tablet; Take 1 tablet (10 mg total) by mouth  daily.  Dispense: 90 tablet; Refill: 3 - BMP8+EGFR  2. Peripheral edema - amLODipine (NORVASC) 5 MG tablet; Take 1 tablet (5 mg total) by mouth daily.  Dispense: 90 tablet; Refill: 3 - BMP8+EGFR  3. Obesity (BMI 30-39.9) - BMP8+EGFR  I believe this edema is related to his Norvasc We will decrease to 5 mg daily from 10 mg and add lisinopril 10 mg. (pt has been on this in the past and states it works well) Low salt diet Compression hose as needed RTO in 2 weeks   Evelina Dun, FNP

## 2017-03-29 NOTE — Patient Instructions (Signed)
Edema Edema is when you have too much fluid in your body or under your skin. Edema may make your legs, feet, and ankles swell up. Swelling is also common in looser tissues, like around your eyes. This is a common condition. It gets more common as you get older. There are many possible causes of edema. Eating too much salt (sodium) and being on your feet or sitting for a long time can cause edema in your legs, feet, and ankles. Hot weather may make edema worse. Edema is usually painless. Your skin may look swollen or shiny. Follow these instructions at home:  Keep the swollen body part raised (elevated) above the level of your heart when you are sitting or lying down.  Do not sit still or stand for a long time.  Do not wear tight clothes. Do not wear garters on your upper legs.  Exercise your legs. This can help the swelling go down.  Wear elastic bandages or support stockings as told by your doctor.  Eat a low-salt (low-sodium) diet to reduce fluid as told by your doctor.  Depending on the cause of your swelling, you may need to limit how much fluid you drink (fluid restriction).  Take over-the-counter and prescription medicines only as told by your doctor. Contact a doctor if:  Treatment is not working.  You have heart, liver, or kidney disease and have symptoms of edema.  You have sudden and unexplained weight gain. Get help right away if:  You have shortness of breath or chest pain.  You cannot breathe when you lie down.  You have pain, redness, or warmth in the swollen areas.  You have heart, liver, or kidney disease and get edema all of a sudden.  You have a fever and your symptoms get worse all of a sudden. Summary  Edema is when you have too much fluid in your body or under your skin.  Edema may make your legs, feet, and ankles swell up. Swelling is also common in looser tissues, like around your eyes.  Raise (elevate) the swollen body part above the level of your  heart when you are sitting or lying down.  Follow your doctor's instructions about diet and how much fluid you can drink (fluid restriction). This information is not intended to replace advice given to you by your health care provider. Make sure you discuss any questions you have with your health care provider. Document Released: 02/26/2008 Document Revised: 09/27/2016 Document Reviewed: 09/27/2016 Elsevier Interactive Patient Education  2017 Elsevier Inc.  

## 2017-03-30 LAB — BMP8+EGFR
BUN/Creatinine Ratio: 16 (ref 10–24)
BUN: 18 mg/dL (ref 8–27)
CALCIUM: 9.5 mg/dL (ref 8.6–10.2)
CO2: 21 mmol/L (ref 20–29)
CREATININE: 1.16 mg/dL (ref 0.76–1.27)
Chloride: 103 mmol/L (ref 96–106)
GFR, EST AFRICAN AMERICAN: 78 mL/min/{1.73_m2} (ref >59)
GFR, EST NON AFRICAN AMERICAN: 68 mL/min/{1.73_m2} (ref >59)
Glucose: 94 mg/dL (ref 65–99)
Potassium: 4.5 mmol/L (ref 3.5–5.2)
SODIUM: 138 mmol/L (ref 134–144)

## 2017-05-28 ENCOUNTER — Encounter: Payer: Self-pay | Admitting: Family

## 2017-05-28 ENCOUNTER — Ambulatory Visit (INDEPENDENT_AMBULATORY_CARE_PROVIDER_SITE_OTHER): Payer: 59 | Admitting: Family

## 2017-05-28 VITALS — BP 128/84 | HR 75 | Temp 97.9°F | Ht 70.0 in | Wt 207.8 lb

## 2017-05-28 DIAGNOSIS — L0291 Cutaneous abscess, unspecified: Secondary | ICD-10-CM

## 2017-05-28 NOTE — Progress Notes (Signed)
   Subjective:    Patient ID: Jeffrey [name], male    DOB: October 06, 1955, 61 y.o.   MRN: [id number]  HPI PT presents to the office today with an abscess on right back that he noticed yesterday. States intermittent soreness of 3 out 10. Denies any discharge.    Review of Systems  Skin: Positive for wound.  All other systems reviewed and are negative.      Objective:   Physical Exam  Constitutional: He is oriented to person, place, and time. He appears well-developed and well-nourished. No distress.  HENT:  Head: Normocephalic.  Cardiovascular: Normal rate, regular rhythm, normal heart sounds and intact distal pulses.   No murmur heard. Pulmonary/Chest: Effort normal and breath sounds normal. No respiratory distress. He has no wheezes.  Abdominal: Soft. Bowel sounds are normal. He exhibits no distension. There is no tenderness.  Musculoskeletal: Normal range of motion. He exhibits no edema or tenderness.  Neurological: He is alert and oriented to person, place, and time.  Skin: Skin is warm and dry. Rash noted. Rash is pustular (1cmX0.70mm). No erythema.     Psychiatric: He has a normal mood and affect. His behavior is normal. Judgment and thought content normal.  Vitals reviewed.   Area cleaned and small amount of purulent discharge. Antibiotic ointment applied.      Ht 5\' 10"  (1.778 m)   Wt 207 lb 12.8 oz (94.3 kg)   BMI 29.82 kg/m      Assessment & Plan:  1. Abscess Keep clean and dry  Warm compresses Report any fever, erythemas, or pain RTO prn    Evelina Dun, FNP

## 2017-05-28 NOTE — Patient Instructions (Signed)

## 2017-08-05 DIAGNOSIS — M25511 Pain in right shoulder: Secondary | ICD-10-CM | POA: Diagnosis not present

## 2017-08-08 DIAGNOSIS — S42254A Nondisplaced fracture of greater tuberosity of right humerus, initial encounter for closed fracture: Secondary | ICD-10-CM | POA: Diagnosis not present

## 2017-08-08 DIAGNOSIS — S43431A Superior glenoid labrum lesion of right shoulder, initial encounter: Secondary | ICD-10-CM | POA: Diagnosis not present

## 2017-08-29 DIAGNOSIS — S42254D Nondisplaced fracture of greater tuberosity of right humerus, subsequent encounter for fracture with routine healing: Secondary | ICD-10-CM | POA: Diagnosis not present

## 2017-09-10 ENCOUNTER — Encounter: Payer: Self-pay | Admitting: Family Medicine

## 2017-09-10 ENCOUNTER — Ambulatory Visit: Payer: 59 | Admitting: Family Medicine

## 2017-09-10 VITALS — BP 123/87 | HR 78 | Temp 97.8°F | Ht 70.0 in | Wt 208.0 lb

## 2017-09-10 DIAGNOSIS — J0100 Acute maxillary sinusitis, unspecified: Secondary | ICD-10-CM | POA: Diagnosis not present

## 2017-09-10 DIAGNOSIS — J069 Acute upper respiratory infection, unspecified: Secondary | ICD-10-CM

## 2017-09-10 DIAGNOSIS — H6121 Impacted cerumen, right ear: Secondary | ICD-10-CM | POA: Diagnosis not present

## 2017-09-10 MED ORDER — BENZONATATE 200 MG PO CAPS: 200.0000 mg | ORAL_CAPSULE | Freq: Two times a day (BID) | ORAL | 0 refills | Status: DC | PRN

## 2017-09-10 MED ORDER — AMOXICILLIN-POT CLAVULANATE 875-125 MG PO TABS: 1.0000 | ORAL_TABLET | Freq: Two times a day (BID) | ORAL | 0 refills | Status: DC
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
## 2017-09-10 NOTE — Patient Instructions (Signed)
As we discussed, this does not appear to be a bacterial infection and an antibiotic would not alter the course of a viral infection.  However, given the upcoming holiday I have prescribed you an antibiotic to have on hand should you need it.  Reasons for use would be: Fevers, chills, pus from the nose, symptoms lasting longer than 2 weeks.  I have also sent in [facility] for use twice a day as needed for coughing.  It appears that you have a viral upper respiratory infection (cold).  Cold symptoms can last up to 2 weeks.  I recommend that you only use cold medications that are safe in high blood pressure like Coricidin (generic is fine).  Other cold medications can increase your blood pressure.    - Get plenty of rest and drink plenty of fluids. - Try to breathe moist air. Use a cold mist humidifier. - Consume warm fluids (soup or tea) to provide relief for a stuffy nose and to loosen phlegm. - For nasal stuffiness, try saline nasal spray or a Neti Pot.  Afrin nasal spray can also be used but this product should not be used longer than 3 days or it will cause rebound nasal stuffiness (worsening nasal congestion). - For sore throat pain relief: suck on throat lozenges, hard candy or popsicles; gargle with warm salt water (1/4 tsp. salt per 8 oz. of water); and eat soft, bland foods. - Eat a well-balanced diet. If you cannot, ensure you are getting enough nutrients by taking a daily multivitamin. - Avoid dairy products, as they can thicken phlegm. - Avoid alcohol, as it impairs your body's immune system.  CONTACT YOUR DOCTOR IF YOU EXPERIENCE ANY OF THE FOLLOWING: - High fever - Ear pain - Sinus-type headache - Unusually severe cold symptoms - Cough that gets worse while other cold symptoms improve - Flare up of any chronic lung problem, such as asthma - Your symptoms persist longer than 2 weeks

## 2017-10-14 ENCOUNTER — Encounter: Payer: Self-pay | Admitting: Family Medicine

## 2017-10-14 ENCOUNTER — Ambulatory Visit: Payer: 59 | Admitting: Family Medicine

## 2017-10-14 VITALS — BP 139/89 | HR 75 | Temp 97.2°F | Ht 70.0 in | Wt 208.0 lb

## 2017-10-14 DIAGNOSIS — J0100 Acute maxillary sinusitis, unspecified: Secondary | ICD-10-CM | POA: Diagnosis not present

## 2017-10-14 MED ORDER — FLUTICASONE PROPIONATE 50 MCG/ACT NA SUSP: 2.0000 | Freq: Every day | NASAL | 6 refills | Status: DC

## 2017-10-14 NOTE — Patient Instructions (Signed)
There is no evidence of bacterial infection on your exam today.  As we discussed, please use nasal saline spray and Flonase to manage upper respiratory symptoms.  You may use the below cough and cold medication if needed for symptom management.  Avoid other over-the-counter medications for cough and cold as they can increase your blood pressure.  It appears that you have a viral upper respiratory infection (cold).  Cold symptoms can last up to 2 weeks.  I recommend that you only use cold medications that are safe in high blood pressure like Coricidin (generic is fine).  Other cold medications can increase your blood pressure.    - Get plenty of rest and drink plenty of fluids. - Try to breathe moist air. Use a cold mist humidifier. - Consume warm fluids (soup or tea) to provide relief for a stuffy nose and to loosen phlegm. - For nasal stuffiness, try saline nasal spray or a Neti Pot.  Afrin nasal spray can also be used but this product should not be used longer than 3 days or it will cause rebound nasal stuffiness (worsening nasal congestion). - For sore throat pain relief: suck on throat lozenges, hard candy or popsicles; gargle with warm salt water (1/4 tsp. salt per 8 oz. of water); and eat soft, bland foods. - Eat a well-balanced diet. If you cannot, ensure you are getting enough nutrients by taking a daily multivitamin. - Avoid dairy products, as they can thicken phlegm. - Avoid alcohol, as it impairs your body's immune system.  CONTACT YOUR DOCTOR IF YOU EXPERIENCE ANY OF THE FOLLOWING: - High fever - Ear pain - Sinus-type headache - Unusually severe cold symptoms - Cough that gets worse while other cold symptoms improve - Flare up of any chronic lung problem, such as asthma - Your symptoms persist longer than 2 weeks
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
## 2017-10-16 ENCOUNTER — Other Ambulatory Visit: Payer: Self-pay | Admitting: Family Medicine

## 2017-10-16 ENCOUNTER — Telehealth: Payer: Self-pay | Admitting: Family Medicine

## 2017-10-16 MED ORDER — AMOXICILLIN-POT CLAVULANATE 875-125 MG PO TABS: 1.0000 | ORAL_TABLET | Freq: Two times a day (BID) | ORAL | 0 refills | Status: DC

## 2017-10-16 NOTE — Telephone Encounter (Signed)
Please let patient know that I sent in a refill for the Augmentin.

## 2017-10-16 NOTE — Telephone Encounter (Signed)
What symptoms do you have? Eyes and ears are hurting, coughing up green phlegm  How long have you been sick? 5 days  Have you been seen for this problem? Yes by Dr. Darnell Level on Monday  If your provider decides to give you a prescription, which pharmacy would you like for it to be sent to? [facility]   Patient informed that this information will be sent to the clinical staff for review and that they should receive a follow up call.

## 2017-10-16 NOTE — Telephone Encounter (Signed)
Patient aware.

## 2017-10-21 DIAGNOSIS — B308 Other viral conjunctivitis: Secondary | ICD-10-CM | POA: Diagnosis not present

## 2018-03-26 ENCOUNTER — Other Ambulatory Visit: Payer: Self-pay | Admitting: Family

## 2018-03-26 DIAGNOSIS — I10 Essential (primary) hypertension: Secondary | ICD-10-CM

## 2018-03-26 DIAGNOSIS — R609 Edema, unspecified: Secondary | ICD-10-CM

## 2018-03-27 NOTE — Telephone Encounter (Signed)
Last refill without being seen 

## 2018-03-27 NOTE — Telephone Encounter (Signed)
Last seen 10/14/17  Dr Livia Snellen

## 2018-04-05 ENCOUNTER — Other Ambulatory Visit: Payer: Self-pay | Admitting: Nurse Practitioner

## 2018-04-05 DIAGNOSIS — I10 Essential (primary) hypertension: Secondary | ICD-10-CM

## 2018-04-05 DIAGNOSIS — R609 Edema, unspecified: Secondary | ICD-10-CM

## 2018-04-05 DIAGNOSIS — R600 Localized edema: Secondary | ICD-10-CM

## 2018-04-07 NOTE — Telephone Encounter (Signed)
Authorize 30 days only. Then contact the patient letting them know that they will need an appointment before any further prescriptions can be sent in. 

## 2018-04-07 NOTE — Telephone Encounter (Signed)
Last seen 10/14/17  Dr Livia Snellen

## 2018-04-16 ENCOUNTER — Other Ambulatory Visit: Payer: Self-pay | Admitting: *Deleted

## 2018-04-16 DIAGNOSIS — I10 Essential (primary) hypertension: Secondary | ICD-10-CM

## 2018-04-16 DIAGNOSIS — R609 Edema, unspecified: Secondary | ICD-10-CM

## 2018-04-17 NOTE — Telephone Encounter (Signed)
Message left for patient that rx denied and he must be seen

## 2018-08-28 ENCOUNTER — Encounter: Payer: Self-pay | Admitting: Family Medicine

## 2018-08-28 ENCOUNTER — Ambulatory Visit: Payer: 59 | Admitting: Family Medicine

## 2018-08-28 VITALS — BP 129/87 | HR 81 | Temp 97.5°F | Ht 70.0 in | Wt 213.0 lb

## 2018-08-28 DIAGNOSIS — H6501 Acute serous otitis media, right ear: Secondary | ICD-10-CM

## 2018-08-28 DIAGNOSIS — J069 Acute upper respiratory infection, unspecified: Secondary | ICD-10-CM | POA: Diagnosis not present

## 2018-08-28 MED ORDER — AMOXICILLIN-POT CLAVULANATE 875-125 MG PO TABS: 1.0000 | ORAL_TABLET | Freq: Two times a day (BID) | ORAL | 0 refills | Status: DC

## 2018-08-28 MED ORDER — METHYLPREDNISOLONE ACETATE 80 MG/ML IJ SUSP
80.0000 mg | Freq: Once | INTRAMUSCULAR | Status: AC
  Administered 2018-08-28: 80 mg via INTRAMUSCULAR

## 2018-08-28 NOTE — Patient Instructions (Addendum)
- Get plenty of rest and drink plenty of fluids. - Try to breathe moist air. Use a cold mist humidifier. - Consume warm fluids (soup or tea) to provide relief for a stuffy nose and to loosen phlegm. - For nasal stuffiness, try saline nasal spray or a Neti Pot.  Afrin nasal spray can also be used but this product should not be used longer than 3 days or it will cause rebound nasal stuffiness (worsening nasal congestion). - For sore throat pain relief: use chloraseptic spray, suck on throat lozenges, hard candy or popsicles; gargle with warm salt water (1/4 tsp. salt per 8 oz. of water); and eat soft, bland foods. - Eat a well-balanced diet. If you cannot, ensure you are getting enough nutrients by taking a daily multivitamin. - Avoid dairy products, as they can thicken phlegm. - Avoid alcohol, as it impairs your body's immune system.  CONTACT YOUR DOCTOR IF YOU EXPERIENCE ANY OF THE FOLLOWING: - High fever - Ear pain - Sinus-type headache - Unusually severe cold symptoms - Cough that gets worse while other cold symptoms improve - Flare up of any chronic lung problem, such as asthma - Your symptoms persist longer than 2 weeks Otitis Media, Adult Otitis media is redness, soreness, and puffiness (swelling) in the space just behind your eardrum (middle ear). It may be caused by allergies or infection. It often happens along with a cold. Follow these instructions at home:  Take your medicine as told. Finish it even if you start to feel better.  Only take over-the-counter or prescription medicines for pain, discomfort, or fever as told by your doctor.  Follow up with your doctor as told. Contact a doctor if:  You have otitis media only in one ear, or bleeding from your nose, or both.  You notice a lump on your neck.  You are not getting better in 3-5 days.  You feel worse instead of better. Get help right away if:  You have pain that is not helped with medicine.  You have  puffiness, redness, or pain around your ear.  You get a stiff neck.  You cannot move part of your face (paralysis).  You notice that the bone behind your ear hurts when you touch it. This information is not intended to replace advice given to you by your health care provider. Make sure you discuss any questions you have with your health care provider. Document Released: 02/26/2008 Document Revised: 02/15/2016 Document Reviewed: 04/06/2013 Elsevier Interactive Patient Education  2017 [employer].  Upper Respiratory Infection, Adult Most upper respiratory infections (URIs) are caused by a virus. A URI affects the nose, throat, and upper air passages. The most common type of URI is often called "the common cold." Follow these instructions at home:  Take medicines only as told by your doctor.  Gargle warm saltwater or take cough drops to comfort your throat as told by your doctor.  Use a warm mist humidifier or inhale steam from a shower to increase air moisture. This may make it easier to breathe.  Drink enough fluid to keep your pee (urine) clear or pale yellow.  Eat soups and other clear broths.  Have a healthy diet.  Rest as needed.  Go back to work when your fever is gone or your doctor says it is okay. ? You may need to stay home longer to avoid giving your URI to others. ? You can also wear a face mask and wash your hands often  to prevent spread of the virus.  Use your inhaler more if you have asthma.  Do not use any tobacco products, including cigarettes, chewing tobacco, or electronic cigarettes. If you need help quitting, ask your doctor. Contact a doctor if:  You are getting worse, not better.  Your symptoms are not helped by medicine.  You have chills.  You are getting more short of breath.  You have brown or red mucus.  You have yellow or brown discharge from your nose.  You have pain in your face, especially when you bend forward.  You have a  fever.  You have puffy (swollen) neck glands.  You have pain while swallowing.  You have white areas in the back of your throat. Get help right away if:  You have very bad or constant: ? Headache. ? Ear pain. ? Pain in your forehead, behind your eyes, and over your cheekbones (sinus pain). ? Chest pain.  You have long-lasting (chronic) lung disease and any of the following: ? Wheezing. ? Long-lasting cough. ? Coughing up blood. ? A change in your usual mucus.  You have a stiff neck.  You have changes in your: ? Vision. ? Hearing. ? Thinking. ? Mood. This information is not intended to replace advice given to you by your health care provider. Make sure you discuss any questions you have with your health care provider. Document Released: 02/26/2008 Document Revised: 05/12/2016 Document Reviewed: 12/15/2013 Elsevier Interactive Patient Education  2018 [employer].
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
## 2018-09-03 ENCOUNTER — Encounter: Payer: Self-pay | Admitting: Nurse Practitioner

## 2018-09-03 ENCOUNTER — Ambulatory Visit: Payer: 59 | Admitting: Nurse Practitioner

## 2018-09-03 VITALS — BP 122/85 | HR 86 | Temp 97.4°F | Ht 70.0 in | Wt 213.0 lb

## 2018-09-03 DIAGNOSIS — J069 Acute upper respiratory infection, unspecified: Secondary | ICD-10-CM

## 2018-09-03 MED ORDER — AZITHROMYCIN 250 MG PO TABS: ORAL_TABLET | ORAL | 0 refills | Status: DC

## 2018-09-03 MED ORDER — HYDROCODONE-HOMATROPINE 5-1.5 MG/5ML PO SYRP: 5.0000 mL | ORAL_SOLUTION | Freq: Four times a day (QID) | ORAL | 0 refills | Status: DC | PRN

## 2018-09-03 NOTE — Progress Notes (Signed)
Subjective:    Patient ID: Jeffrey [name], male    DOB: [birth year], 62 y.o.   MRN: [id number]   Chief Complaint: Patient not feeling better   HPI Patient come sin c/o not feeling any better. He was seen last by M.Rakes and was dx with otitis media and URI. Was given augmentin and steroids. He is some better but he is still cough with sinus drainage. Cough is productive and is mostly clear.   Review of Systems  Constitutional: Negative for chills and fever.  HENT: Positive for congestion, rhinorrhea and sinus pain. Negative for ear pain, sore throat and trouble swallowing.   Respiratory: Positive for cough.   Cardiovascular: Negative.   Gastrointestinal: Negative.   Genitourinary: Negative.   Neurological: Negative for headaches.  All other systems reviewed and are negative.      Objective:   Physical Exam Constitutional:      General: He is not in acute distress.    Appearance: Normal appearance.  HENT:     Right Ear: Tympanic membrane, ear canal and external ear normal.     Left Ear: Tympanic membrane, ear canal and external ear normal.     Nose: Nose normal.     Mouth/Throat:     Mouth: Mucous membranes are moist.  Eyes:     Extraocular Movements: Extraocular movements intact.     Pupils: Pupils are equal, round, and reactive to light.  Neck:     Musculoskeletal: Normal range of motion and neck supple. No muscular tenderness.  Cardiovascular:     Rate and Rhythm: Normal rate and regular rhythm.     Pulses: Normal pulses.     Heart sounds: Normal heart sounds.  Pulmonary:     Effort: Pulmonary effort is normal.     Breath sounds: Rhonchi (bil lower lobes) present.  Skin:    General: Skin is warm.  Neurological:     Mental Status: He is alert and oriented to person, place, and time.  Psychiatric:        Mood and Affect: Mood normal.        Behavior: Behavior normal.        Thought Content: Thought content normal.   BP 122/85   Pulse 86   Temp (!) 97.4 F (36.3  C) (Oral)   Ht 5\' 10"  (1.778 m)   Wt 213 lb (96.6 kg)   BMI 30.56 kg/m      Assessment & Plan:  Jeffrey [name] in today with chief complaint of Patient not feeling better   1. Upper respiratory infection with cough and congestion 1. Take meds as prescribed 2. Use a cool mist humidifier especially during the winter months and when heat has been humid. 3. Use saline nose sprays frequently 4. Saline irrigations of the nose can be very helpful if done frequently.  * 4X daily for 1 week*  * Use of a nettie pot can be helpful with this. Follow directions with this* 5. Drink plenty of fluids 6. Keep thermostat turn down low 7.For any cough or congestion  Use plain Mucinex- regular strength or max strength is fine   * Children- consult with Pharmacist for dosing 8. For fever or aces or pains- take tylenol or ibuprofen appropriate for age and weight.  * for fevers greater than 101 orally you may alternate ibuprofen and tylenol every  3 hours.    - HYDROcodone-homatropine (HYCODAN) 5-1.5 MG/5ML syrup; Take 5 mLs by mouth every 6 (six) hours as needed  for cough.  Dispense: 120 mL; Refill: 0 - azithromycin (ZITHROMAX Z-PAK) 250 MG tablet; As directed  Dispense: 6 tablet; Refill: 0  Mary-Margaret Hassell Done, FNP

## 2018-09-03 NOTE — Patient Instructions (Signed)

## 2018-09-07 ENCOUNTER — Telehealth: Payer: Self-pay | Admitting: Family Medicine

## 2018-09-07 NOTE — Telephone Encounter (Signed)
PT aware verbalized understanding

## 2018-09-07 NOTE — Telephone Encounter (Signed)
Pt was told to call back and let MMM know if he did not get any better, he took the last pill of the azithromycin (ZITHROMAX Z-PAK) 250 MG tablet this morning, pt states that he is still having congestion head coughing up fleam cloudy colored. PT wants to know if he needs to come back in or what he needs to do next.  Pharmacy: [facility]

## 2018-09-07 NOTE — Telephone Encounter (Signed)
antibiotis stays in system for 10 days even though only take for 5 days. It should run its course and get better

## 2018-09-08 ENCOUNTER — Encounter: Payer: Self-pay | Admitting: Family Medicine

## 2018-09-08 ENCOUNTER — Ambulatory Visit: Payer: 59 | Admitting: Family Medicine

## 2018-09-08 VITALS — BP 112/75 | HR 81 | Temp 97.8°F | Ht 70.0 in | Wt 211.8 lb

## 2018-09-08 DIAGNOSIS — J069 Acute upper respiratory infection, unspecified: Secondary | ICD-10-CM

## 2018-09-08 DIAGNOSIS — R609 Edema, unspecified: Secondary | ICD-10-CM | POA: Diagnosis not present

## 2018-09-08 DIAGNOSIS — I10 Essential (primary) hypertension: Secondary | ICD-10-CM

## 2018-09-08 MED ORDER — LISINOPRIL 10 MG PO TABS: 10.0000 mg | ORAL_TABLET | Freq: Every day | ORAL | 1 refills | Status: DC

## 2018-09-08 MED ORDER — AMLODIPINE BESYLATE 5 MG PO TABS: 5.0000 mg | ORAL_TABLET | Freq: Every day | ORAL | 1 refills | Status: DC
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
## 2018-09-09 ENCOUNTER — Other Ambulatory Visit: Payer: 59

## 2018-09-09 DIAGNOSIS — I10 Essential (primary) hypertension: Secondary | ICD-10-CM | POA: Diagnosis not present

## 2018-09-10 LAB — CBC WITH DIFFERENTIAL/PLATELET
BASOS: 1 %
Basophils Absolute: 0.1 10*3/uL (ref 0.0–0.2)
EOS (ABSOLUTE): 0.1 10*3/uL (ref 0.0–0.4)
Eos: 2 %
Hematocrit: 43.1 % (ref 37.5–51.0)
Hemoglobin: 14.3 g/dL (ref 13.0–17.7)
Immature Grans (Abs): 0.1 10*3/uL (ref 0.0–0.1)
Immature Granulocytes: 1 %
Lymphocytes Absolute: 1.4 10*3/uL (ref 0.7–3.1)
Lymphs: 23 %
MCH: 28.8 pg (ref 26.6–33.0)
MCHC: 33.2 g/dL (ref 31.5–35.7)
MCV: 87 fL (ref 79–97)
Monocytes Absolute: 0.5 10*3/uL (ref 0.1–0.9)
Monocytes: 9 %
Neutrophils Absolute: 3.9 10*3/uL (ref 1.4–7.0)
Neutrophils: 64 %
Platelets: 321 10*3/uL (ref 150–450)
RBC: 4.96 x10E6/uL (ref 4.14–5.80)
RDW: 12.2 % — ABNORMAL LOW (ref 12.3–15.4)
WBC: 6.1 10*3/uL (ref 3.4–10.8)

## 2018-09-10 LAB — CMP14+EGFR
ALT: 22 IU/L (ref 0–44)
AST: 20 IU/L (ref 0–40)
Albumin/Globulin Ratio: 2 (ref 1.2–2.2)
Albumin: 4.3 g/dL (ref 3.6–4.8)
Alkaline Phosphatase: 69 IU/L (ref 39–117)
BUN/Creatinine Ratio: 18 (ref 10–24)
BUN: 20 mg/dL (ref 8–27)
Bilirubin Total: 0.5 mg/dL (ref 0.0–1.2)
CO2: 24 mmol/L (ref 20–29)
CREATININE: 1.09 mg/dL (ref 0.76–1.27)
Calcium: 9.3 mg/dL (ref 8.6–10.2)
Chloride: 106 mmol/L (ref 96–106)
GFR calc Af Amer: 84 mL/min/{1.73_m2} (ref >59)
GFR, EST NON AFRICAN AMERICAN: 72 mL/min/{1.73_m2} (ref >59)
GLUCOSE: 87 mg/dL (ref 65–99)
Globulin, Total: 2.2 g/dL (ref 1.5–4.5)
Potassium: 4.2 mmol/L (ref 3.5–5.2)
Sodium: 144 mmol/L (ref 134–144)
Total Protein: 6.5 g/dL (ref 6.0–8.5)

## 2018-09-10 LAB — LIPID PANEL
Chol/HDL Ratio: 3.9 ratio (ref 0.0–5.0)
Cholesterol, Total: 165 mg/dL (ref 100–199)
HDL: 42 mg/dL (ref >39)
LDL Calculated: 111 mg/dL — ABNORMAL HIGH (ref 0–99)
Triglycerides: 60 mg/dL (ref 0–149)
VLDL Cholesterol Cal: 12 mg/dL (ref 5–40)

## 2018-09-17 NOTE — Progress Notes (Signed)
Hello Jeffrey [name],  Your lab result is normal.Some minor variations that are not significant are commonly marked abnormal, but do not represent any medical problem for you.  Best regards, Claretta Fraise, M.D.

## 2018-10-05 ENCOUNTER — Telehealth: Payer: Self-pay | Admitting: Family Medicine

## 2018-10-05 NOTE — Telephone Encounter (Signed)
Please advise on patient's problem with cramping.  Lab work was essentially normal.

## 2018-10-13 DIAGNOSIS — M1712 Unilateral primary osteoarthritis, left knee: Secondary | ICD-10-CM | POA: Diagnosis not present

## 2018-10-13 DIAGNOSIS — M25562 Pain in left knee: Secondary | ICD-10-CM | POA: Diagnosis not present

## 2018-10-21 ENCOUNTER — Encounter: Payer: Self-pay | Admitting: Family Medicine

## 2018-10-21 ENCOUNTER — Ambulatory Visit: Payer: 59 | Admitting: Family Medicine

## 2018-10-21 VITALS — BP 113/82 | HR 83 | Temp 98.3°F | Ht 70.0 in | Wt 210.4 lb

## 2018-10-21 DIAGNOSIS — K625 Hemorrhage of anus and rectum: Secondary | ICD-10-CM

## 2018-10-21 DIAGNOSIS — K648 Other hemorrhoids: Secondary | ICD-10-CM

## 2018-10-21 DIAGNOSIS — M25562 Pain in left knee: Secondary | ICD-10-CM | POA: Diagnosis not present

## 2018-10-21 DIAGNOSIS — K5904 Chronic idiopathic constipation: Secondary | ICD-10-CM | POA: Diagnosis not present

## 2018-10-21 DIAGNOSIS — Z1211 Encounter for screening for malignant neoplasm of colon: Secondary | ICD-10-CM | POA: Diagnosis not present
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
## 2018-10-27 DIAGNOSIS — M1712 Unilateral primary osteoarthritis, left knee: Secondary | ICD-10-CM | POA: Diagnosis not present

## 2018-10-27 DIAGNOSIS — M2242 Chondromalacia patellae, left knee: Secondary | ICD-10-CM | POA: Diagnosis not present

## 2018-10-27 DIAGNOSIS — M25562 Pain in left knee: Secondary | ICD-10-CM | POA: Diagnosis not present

## 2018-11-04 DIAGNOSIS — M1712 Unilateral primary osteoarthritis, left knee: Secondary | ICD-10-CM | POA: Diagnosis not present

## 2018-11-25 ENCOUNTER — Encounter: Payer: Self-pay | Admitting: Family Medicine

## 2018-12-14 ENCOUNTER — Encounter: Payer: Self-pay | Admitting: Family Medicine

## 2018-12-14 ENCOUNTER — Other Ambulatory Visit: Payer: Self-pay

## 2018-12-14 ENCOUNTER — Ambulatory Visit: Payer: 59 | Admitting: Family Medicine

## 2018-12-14 VITALS — BP 100/70 | HR 100 | Temp 97.9°F | Ht 70.0 in | Wt 207.2 lb

## 2018-12-14 DIAGNOSIS — W57XXXA Bitten or stung by nonvenomous insect and other nonvenomous arthropods, initial encounter: Secondary | ICD-10-CM

## 2018-12-14 DIAGNOSIS — S40262A Insect bite (nonvenomous) of left shoulder, initial encounter: Secondary | ICD-10-CM

## 2018-12-14 MED ORDER — DOXYCYCLINE HYCLATE 100 MG PO CAPS: 100.0000 mg | ORAL_CAPSULE | Freq: Two times a day (BID) | ORAL | 0 refills | Status: DC

## 2018-12-14 NOTE — Progress Notes (Signed)
Chief Complaint  Patient presents with  . Tick Removal    pt here today c/o tick bite on back and in belly button    HPI  Patient presents today for bitten by tick, brings in typical nonengorged "dog tick " that he pulled off of posterior let shoulder. Feels like something is still there. Locally irritated. This morning he felt something at his umbilicus. He scratched there and an engorged tick fell out. No fever or rash. No malaise. The first tick was pulled off within 24 hours. The second is unknown.   PMH: Smoking status noted ROS: Per HPI  Objective: BP 100/70   Pulse 100   Temp 97.9 F (36.6 C) (Oral)   Ht 5\' 10"  (1.778 m)   Wt 207 lb 4 oz (94 kg)   BMI 29.74 kg/m  Gen: NAD, alert, cooperative with exam HEENT: NCAT, EOMI, PERRL Skin: 1 cm erythema with central nidus at upper left shoulder blade region. Tick inspected. Typical nonengorged dog tick. Umbilicus shows no erythema or lesion. Tick measures 2 mm, engorged appearance of deer tick. Neither has the white mark of the [place] tick.  Ext: No edema, warm Neuro: Alert and oriented, No gross deficits  Assessment and plan:  1. Tick bite, initial encounter     Meds ordered this encounter  Medications  . doxycycline (VIBRAMYCIN) 100 MG capsule    Sig: Take 1 capsule (100 mg total) by mouth 2 (two) times daily.    Dispense:  20 capsule    Refill:  0    No orders of the defined types were placed in this encounter.   Follow up as needed.  Claretta Fraise, MD

## 2018-12-21 DIAGNOSIS — H1045 Other chronic allergic conjunctivitis: Secondary | ICD-10-CM | POA: Diagnosis not present

## 2019-03-10 ENCOUNTER — Other Ambulatory Visit: Payer: Self-pay | Admitting: Family Medicine

## 2019-03-10 DIAGNOSIS — R609 Edema, unspecified: Secondary | ICD-10-CM

## 2019-03-10 DIAGNOSIS — R600 Localized edema: Secondary | ICD-10-CM

## 2019-03-10 DIAGNOSIS — I10 Essential (primary) hypertension: Secondary | ICD-10-CM

## 2019-04-14 ENCOUNTER — Ambulatory Visit: Payer: 59 | Admitting: Family Medicine

## 2019-08-13 ENCOUNTER — Ambulatory Visit: Payer: 59 | Attending: Specialist | Admitting: Physical Therapy

## 2019-08-13 ENCOUNTER — Other Ambulatory Visit: Payer: Self-pay

## 2019-08-13 ENCOUNTER — Encounter: Payer: Self-pay | Admitting: Physical Therapy

## 2019-08-13 DIAGNOSIS — M25562 Pain in left knee: Secondary | ICD-10-CM | POA: Diagnosis not present

## 2019-08-13 DIAGNOSIS — R600 Localized edema: Secondary | ICD-10-CM | POA: Diagnosis present

## 2019-08-13 DIAGNOSIS — G8929 Other chronic pain: Secondary | ICD-10-CM | POA: Diagnosis present

## 2019-08-13 DIAGNOSIS — M25662 Stiffness of left knee, not elsewhere classified: Secondary | ICD-10-CM
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
## 2019-08-17 ENCOUNTER — Other Ambulatory Visit: Payer: Self-pay

## 2019-08-17 ENCOUNTER — Ambulatory Visit: Payer: 59 | Admitting: *Deleted

## 2019-08-17 DIAGNOSIS — M25562 Pain in left knee: Secondary | ICD-10-CM

## 2019-08-17 DIAGNOSIS — G8929 Other chronic pain: Secondary | ICD-10-CM

## 2019-08-17 DIAGNOSIS — R600 Localized edema: Secondary | ICD-10-CM

## 2019-08-17 DIAGNOSIS — M25662 Stiffness of left knee, not elsewhere classified: Secondary | ICD-10-CM
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
## 2019-08-23 ENCOUNTER — Ambulatory Visit: Payer: 59 | Admitting: Physical Therapy

## 2019-08-26 ENCOUNTER — Encounter: Payer: Self-pay | Admitting: Physical Therapy

## 2019-08-26 ENCOUNTER — Other Ambulatory Visit: Payer: Self-pay

## 2019-08-26 ENCOUNTER — Ambulatory Visit: Payer: 59 | Attending: Specialist | Admitting: Physical Therapy

## 2019-08-26 DIAGNOSIS — M25662 Stiffness of left knee, not elsewhere classified: Secondary | ICD-10-CM | POA: Diagnosis present

## 2019-08-26 DIAGNOSIS — R600 Localized edema: Secondary | ICD-10-CM | POA: Diagnosis present

## 2019-08-26 DIAGNOSIS — M25562 Pain in left knee: Secondary | ICD-10-CM | POA: Diagnosis not present

## 2019-08-26 DIAGNOSIS — G8929 Other chronic pain: Secondary | ICD-10-CM
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
## 2019-08-30 ENCOUNTER — Encounter: Payer: Self-pay | Admitting: Physical Therapy

## 2019-08-30 ENCOUNTER — Other Ambulatory Visit: Payer: Self-pay

## 2019-08-30 ENCOUNTER — Ambulatory Visit: Payer: 59 | Admitting: Physical Therapy

## 2019-08-30 DIAGNOSIS — G8929 Other chronic pain: Secondary | ICD-10-CM

## 2019-08-30 DIAGNOSIS — M25562 Pain in left knee: Secondary | ICD-10-CM | POA: Diagnosis not present

## 2019-08-30 DIAGNOSIS — R600 Localized edema: Secondary | ICD-10-CM

## 2019-08-30 DIAGNOSIS — M25662 Stiffness of left knee, not elsewhere classified: Secondary | ICD-10-CM
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
## 2019-08-31 ENCOUNTER — Telehealth: Payer: Self-pay | Admitting: Family Medicine

## 2019-08-31 ENCOUNTER — Other Ambulatory Visit: Payer: Self-pay

## 2019-09-01 ENCOUNTER — Ambulatory Visit: Payer: 59 | Admitting: Physical Therapy

## 2019-09-01 ENCOUNTER — Encounter: Payer: Self-pay | Admitting: Physical Therapy

## 2019-09-01 ENCOUNTER — Encounter: Payer: Self-pay | Admitting: Family Medicine

## 2019-09-01 ENCOUNTER — Ambulatory Visit: Payer: 59 | Admitting: Family Medicine

## 2019-09-01 ENCOUNTER — Other Ambulatory Visit: Payer: Self-pay

## 2019-09-01 VITALS — BP 108/69 | HR 82 | Temp 97.3°F | Wt 203.0 lb

## 2019-09-01 DIAGNOSIS — I10 Essential (primary) hypertension: Secondary | ICD-10-CM | POA: Diagnosis not present

## 2019-09-01 DIAGNOSIS — G8929 Other chronic pain: Secondary | ICD-10-CM

## 2019-09-01 DIAGNOSIS — R1319 Other dysphagia: Secondary | ICD-10-CM

## 2019-09-01 DIAGNOSIS — Z1159 Encounter for screening for other viral diseases: Secondary | ICD-10-CM | POA: Diagnosis not present

## 2019-09-01 DIAGNOSIS — R252 Cramp and spasm: Secondary | ICD-10-CM

## 2019-09-01 DIAGNOSIS — R1310 Dysphagia, unspecified: Secondary | ICD-10-CM | POA: Diagnosis not present

## 2019-09-01 DIAGNOSIS — R600 Localized edema: Secondary | ICD-10-CM

## 2019-09-01 DIAGNOSIS — M25562 Pain in left knee: Secondary | ICD-10-CM

## 2019-09-01 DIAGNOSIS — M25662 Stiffness of left knee, not elsewhere classified: Secondary | ICD-10-CM
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
## 2019-09-02 ENCOUNTER — Encounter: Payer: Self-pay | Admitting: Physician Assistant

## 2019-09-02 ENCOUNTER — Ambulatory Visit: Payer: 59 | Admitting: Physician Assistant

## 2019-09-02 VITALS — BP 106/60 | HR 68 | Temp 97.0°F | Ht 70.0 in | Wt 202.0 lb

## 2019-09-02 DIAGNOSIS — R1310 Dysphagia, unspecified: Secondary | ICD-10-CM

## 2019-09-02 DIAGNOSIS — Z86010 Personal history of colonic polyps: Secondary | ICD-10-CM | POA: Diagnosis not present

## 2019-09-02 DIAGNOSIS — Z1159 Encounter for screening for other viral diseases: Secondary | ICD-10-CM

## 2019-09-02 LAB — CBC WITH DIFFERENTIAL/PLATELET
Basophils Absolute: 0.1 10*3/uL (ref 0.0–0.2)
Basos: 1 %
EOS (ABSOLUTE): 0.2 10*3/uL (ref 0.0–0.4)
Eos: 2 %
Hematocrit: 44.4 % (ref 37.5–51.0)
Hemoglobin: 15 g/dL (ref 13.0–17.7)
Immature Grans (Abs): 0 10*3/uL (ref 0.0–0.1)
Immature Granulocytes: 0 %
Lymphocytes Absolute: 1.4 10*3/uL (ref 0.7–3.1)
Lymphs: 20 %
MCH: 29.1 pg (ref 26.6–33.0)
MCHC: 33.8 g/dL (ref 31.5–35.7)
MCV: 86 fL (ref 79–97)
Monocytes Absolute: 0.6 10*3/uL (ref 0.1–0.9)
Monocytes: 9 %
Neutrophils Absolute: 4.7 10*3/uL (ref 1.4–7.0)
Neutrophils: 68 %
Platelets: 247 10*3/uL (ref 150–450)
RBC: 5.15 x10E6/uL (ref 4.14–5.80)
RDW: 12.1 % (ref 11.6–15.4)
WBC: 6.9 10*3/uL (ref 3.4–10.8)

## 2019-09-02 LAB — CMP14+EGFR
ALT: 16 IU/L (ref 0–44)
AST: 15 IU/L (ref 0–40)
Albumin/Globulin Ratio: 2.3 — ABNORMAL HIGH (ref 1.2–2.2)
Albumin: 4.3 g/dL (ref 3.8–4.8)
Alkaline Phosphatase: 69 IU/L (ref 39–117)
BUN/Creatinine Ratio: 17 (ref 10–24)
BUN: 18 mg/dL (ref 8–27)
Bilirubin Total: 0.4 mg/dL (ref 0.0–1.2)
CO2: 23 mmol/L (ref 20–29)
Calcium: 9.6 mg/dL (ref 8.6–10.2)
Chloride: 105 mmol/L (ref 96–106)
Creatinine, Ser: 1.08 mg/dL (ref 0.76–1.27)
GFR calc Af Amer: 84 mL/min/{1.73_m2} (ref >59)
GFR calc non Af Amer: 73 mL/min/{1.73_m2} (ref >59)
Globulin, Total: 1.9 g/dL (ref 1.5–4.5)
Glucose: 92 mg/dL (ref 65–99)
Potassium: 4.6 mmol/L (ref 3.5–5.2)
Sodium: 141 mmol/L (ref 134–144)
Total Protein: 6.2 g/dL (ref 6.0–8.5)

## 2019-09-02 LAB — HEPATITIS C ANTIBODY: Hep C Virus Ab: <0.1 s/co ratio (ref 0.0–0.9)

## 2019-09-02 NOTE — Progress Notes (Signed)
Hello Isaid,  Your lab result is normal and/or stable.Some minor variations that are not significant are commonly marked abnormal, but do not represent any medical problem for you.  Best regards, Cleo Villamizar, M.D.

## 2019-09-02 NOTE — Progress Notes (Signed)
Subjective:    Patient ID: Jeffrey [name], male    DOB: 1956/09/14, 63 y.o.   MRN: [id number]  HPI Jeffrey [name] is a pleasant 63 year old white male, known remotely to Jeffrey [name], who is referred back today by Jeffrey [name] for evaluation of solid food dysphagia. Patient had colonoscopy in December 2011 with removal of 1 diminutive polyp from the ascending colon.  Path on the polyp was consistent with a tubular adenoma and he was indicated for 5-year interval follow-up. He has not had prior EGD. Patient states that he has been having dysphagia to meats breads, sometimes cheese and potatoes over the past year.  He has had some progression of symptoms but is not having symptoms every day.  He says he is having frequent episodes and most of the time food actually becomes lodged and requires him to regurgitate.  He has no complaints of chronic heartburn or indigestion.  No difficulty swallowing liquids.  Appetite has been fine, and weight has been stable. He denies any lower GI symptoms, specifically no changes in bowel habits melena or hematochezia.  He thought he was supposed to have another colonoscopy at 10-year interval. He did have an arthroscopic surgery on his left knee about 3 weeks ago and has been on high-dose aspirin for DVT prophylaxis.  He is supposed to be on aspirin 325 for 6 weeks total.  Review of Systems Pertinent positive and negative review of systems were noted in the above HPI section.  All other review of systems was otherwise negative.  Outpatient Encounter Medications as of 09/02/2019  Medication Sig  . amLODipine (NORVASC) 5 MG tablet TAKE 1 TABLET BY MOUTH EVERY DAY  . aspirin 325 MG tablet Take 325 mg by mouth daily.  . diclofenac Sodium (VOLTAREN) 1 % GEL Apply topically 4 (four) times daily.  . [DISCONTINUED] lisinopril (ZESTRIL) 10 MG tablet TAKE 1 TABLET BY MOUTH EVERY DAY   No facility-administered encounter medications on file as of 09/02/2019.   Allergies  Allergen  Reactions  . Sulfonamide Derivatives     REACTION: rash   Patient Active Problem List   Diagnosis Date Noted  . Obesity (BMI 30-39.9) 03/29/2017  . Itching 06/08/2015  . Hypertension   . Pulmonary asbestosis ([place])   . Lumbar radiculopathy, acute 09/22/2013   Social History   Socioeconomic History  . Marital status: Divorced    Spouse name: Not on file  . Number of children: Not on file  . Years of education: Not on file  . Highest education level: Not on file  Occupational History  . Not on file  Tobacco Use  . Smoking status: Never Smoker  . Smokeless tobacco: Never Used  Substance and Sexual Activity  . Alcohol use: Yes    Comment: OCCAS ALCOHOL - MAYBE 2 DRINKS A MONTH  . Drug use: No  . Sexual activity: Not on file  Other Topics Concern  . Not on file  Social History Narrative  . Not on file   Social Determinants of Health   Financial Resource Strain:   . Difficulty of Paying Living Expenses: Not on file  Food Insecurity:   . Worried About Charity fundraiser in the Last Year: Not on file  . Ran Out of Food in the Last Year: Not on file  Transportation Needs:   . Lack of Transportation (Medical): Not on file  . Lack of Transportation (Non-Medical): Not on file  Physical Activity:   . Days of Exercise per  Week: Not on file  . Minutes of Exercise per Session: Not on file  Stress:   . Feeling of Stress : Not on file  Social Connections:   . Frequency of Communication with Friends and Family: Not on file  . Frequency of Social Gatherings with Friends and Family: Not on file  . Attends Religious Services: Not on file  . Active Member of Clubs or Organizations: Not on file  . Attends Archivist Meetings: Not on file  . Marital Status: Not on file  Intimate Partner Violence:   . Fear of Current or Ex-Partner: Not on file  . Emotionally Abused: Not on file  . Physically Abused: Not on file  . Sexually Abused: Not on file    [name] family  history includes Cancer in his paternal grandmother; Hypertension in his father.      Objective:    Vitals:   09/02/19 0859  BP: 106/60  Pulse: 68  Temp: (!) 97 F (36.1 C)    Physical Exam Well-developed well-nourished older white male in no acute distress.   Weight, 202 BMI 28.9  HEENT; nontraumatic normocephalic, EOMI, PE RR LA, sclera anicteric. Oropharynx; not examined/mask/Covid Neck; supple, no JVD Cardiovascular; regular rate and rhythm with S1-S2, no murmur rub or gallop Pulmonary; Clear bilaterally Abdomen; soft, nontender, nondistended, no palpable mass or hepatosplenomegaly, bowel sounds are active Rectal; not done today Skin; benign exam, no jaundice rash or appreciable lesions Extremities; no clubbing cyanosis or edema skin warm and dry Neuro/Psych; alert and oriented x4, grossly nonfocal mood and affect appropriate       Assessment & Plan:   #60 63 year old white male with 1 year history of somewhat progressive solid food dysphagia with frequent episodes requiring regurgitation. Suspect distal esophageal stricture, rule out Schatzki's ring, doubt neoplasm with chronicity of symptoms #2 history of adenomatous colon polyp-overdue for follow-up. #3 recent arthroscopic surgery left knee, on aspirin 325 daily for 3 more weeks  Plan; Patient will be scheduled for upper Endoscopy with probable esophageal dilation and Colonoscopy with Jeffrey [name].  Both procedures were discussed in detail with the patient including indications risks and benefits and he is agreeable to proceed. Patient to hold aspirin for 24 hours prior to procedures, if still requiring at that time.  Jeffrey Vandegrift Genia Harold PA-C 09/02/2019   Cc: Claretta Fraise, MD

## 2019-09-02 NOTE — Patient Instructions (Signed)
You have been scheduled for an endoscopy and colonoscopy. Please follow the written instructions given to you at your visit today. Please pick up your prep supplies at the pharmacy within the next 1-3 days. If you use inhalers (even only as needed), please bring them with you on the day of your procedure.   I appreciate the opportunity to care for you. Amy Esterwood, PA-C

## 2019-09-03 ENCOUNTER — Encounter: Payer: Self-pay | Admitting: Family Medicine

## 2019-09-06 ENCOUNTER — Encounter: Payer: Self-pay | Admitting: Physical Therapy

## 2019-09-06 ENCOUNTER — Other Ambulatory Visit: Payer: Self-pay

## 2019-09-06 ENCOUNTER — Ambulatory Visit: Payer: 59 | Admitting: Physical Therapy

## 2019-09-06 DIAGNOSIS — R600 Localized edema: Secondary | ICD-10-CM

## 2019-09-06 DIAGNOSIS — M25562 Pain in left knee: Secondary | ICD-10-CM | POA: Diagnosis not present

## 2019-09-06 DIAGNOSIS — G8929 Other chronic pain: Secondary | ICD-10-CM

## 2019-09-06 DIAGNOSIS — M25662 Stiffness of left knee, not elsewhere classified: Secondary | ICD-10-CM
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
## 2019-09-08 ENCOUNTER — Encounter: Payer: Self-pay | Admitting: Physical Therapy

## 2019-09-08 ENCOUNTER — Ambulatory Visit: Payer: 59 | Admitting: Physical Therapy

## 2019-09-08 ENCOUNTER — Other Ambulatory Visit: Payer: Self-pay

## 2019-09-08 DIAGNOSIS — M25662 Stiffness of left knee, not elsewhere classified: Secondary | ICD-10-CM

## 2019-09-08 DIAGNOSIS — M25562 Pain in left knee: Secondary | ICD-10-CM | POA: Diagnosis not present

## 2019-09-08 DIAGNOSIS — G8929 Other chronic pain: Secondary | ICD-10-CM

## 2019-09-08 DIAGNOSIS — R600 Localized edema: Secondary | ICD-10-CM
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
## 2019-09-13 ENCOUNTER — Ambulatory Visit: Payer: 59 | Admitting: Physical Therapy

## 2019-09-13 ENCOUNTER — Encounter: Payer: Self-pay | Admitting: Physical Therapy

## 2019-09-13 ENCOUNTER — Other Ambulatory Visit: Payer: Self-pay

## 2019-09-13 DIAGNOSIS — G8929 Other chronic pain: Secondary | ICD-10-CM

## 2019-09-13 DIAGNOSIS — M25562 Pain in left knee: Secondary | ICD-10-CM | POA: Diagnosis not present

## 2019-09-13 DIAGNOSIS — R600 Localized edema: Secondary | ICD-10-CM

## 2019-09-13 DIAGNOSIS — M25662 Stiffness of left knee, not elsewhere classified: Secondary | ICD-10-CM
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
## 2019-09-14 ENCOUNTER — Other Ambulatory Visit: Payer: Self-pay | Admitting: Family Medicine

## 2019-09-14 DIAGNOSIS — R609 Edema, unspecified: Secondary | ICD-10-CM

## 2019-09-14 DIAGNOSIS — I10 Essential (primary) hypertension: Secondary | ICD-10-CM

## 2019-09-15 ENCOUNTER — Encounter: Payer: Self-pay | Admitting: Physical Therapy

## 2019-09-15 ENCOUNTER — Encounter: Payer: Self-pay | Admitting: Internal Medicine

## 2019-09-15 ENCOUNTER — Other Ambulatory Visit: Payer: Self-pay

## 2019-09-15 ENCOUNTER — Ambulatory Visit: Payer: 59 | Admitting: Physical Therapy

## 2019-09-15 DIAGNOSIS — G8929 Other chronic pain: Secondary | ICD-10-CM

## 2019-09-15 DIAGNOSIS — M25562 Pain in left knee: Secondary | ICD-10-CM

## 2019-09-15 DIAGNOSIS — M25662 Stiffness of left knee, not elsewhere classified: Secondary | ICD-10-CM

## 2019-09-15 DIAGNOSIS — R600 Localized edema: Secondary | ICD-10-CM
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
## 2019-09-20 ENCOUNTER — Other Ambulatory Visit: Payer: Self-pay | Admitting: Internal Medicine

## 2019-09-20 ENCOUNTER — Ambulatory Visit (INDEPENDENT_AMBULATORY_CARE_PROVIDER_SITE_OTHER): Payer: 59

## 2019-09-20 ENCOUNTER — Ambulatory Visit: Payer: 59 | Admitting: Physical Therapy

## 2019-09-20 DIAGNOSIS — Z1159 Encounter for screening for other viral diseases: Secondary | ICD-10-CM

## 2019-09-21 LAB — SARS CORONAVIRUS 2 (TAT 6-24 HRS): SARS Coronavirus 2: NEGATIVE

## 2019-09-22 ENCOUNTER — Other Ambulatory Visit: Payer: Self-pay

## 2019-09-22 ENCOUNTER — Encounter: Payer: 59 | Admitting: Physical Therapy

## 2019-09-22 ENCOUNTER — Ambulatory Visit (AMBULATORY_SURGERY_CENTER): Payer: 59 | Admitting: Internal Medicine

## 2019-09-22 ENCOUNTER — Encounter: Payer: Self-pay | Admitting: Internal Medicine

## 2019-09-22 VITALS — BP 111/80 | HR 68 | Temp 98.3°F | Resp 12 | Ht 70.0 in | Wt 202.0 lb

## 2019-09-22 DIAGNOSIS — K449 Diaphragmatic hernia without obstruction or gangrene: Secondary | ICD-10-CM

## 2019-09-22 DIAGNOSIS — K2970 Gastritis, unspecified, without bleeding: Secondary | ICD-10-CM

## 2019-09-22 DIAGNOSIS — K219 Gastro-esophageal reflux disease without esophagitis: Secondary | ICD-10-CM

## 2019-09-22 DIAGNOSIS — K3189 Other diseases of stomach and duodenum: Secondary | ICD-10-CM

## 2019-09-22 DIAGNOSIS — K222 Esophageal obstruction: Secondary | ICD-10-CM

## 2019-09-22 DIAGNOSIS — K2100 Gastro-esophageal reflux disease with esophagitis, without bleeding: Secondary | ICD-10-CM | POA: Diagnosis not present

## 2019-09-22 DIAGNOSIS — R1310 Dysphagia, unspecified: Secondary | ICD-10-CM | POA: Diagnosis not present

## 2019-09-22 DIAGNOSIS — Z86010 Personal history of colonic polyps: Secondary | ICD-10-CM

## 2019-09-22 DIAGNOSIS — K228 Other specified diseases of esophagus: Secondary | ICD-10-CM

## 2019-09-22 MED ORDER — SODIUM CHLORIDE 0.9 % IV SOLN: 500.0000 mL | Freq: Once | INTRAVENOUS | Status: DC

## 2019-09-22 MED ORDER — ESOMEPRAZOLE MAGNESIUM 40 MG PO CPDR: 40.0000 mg | DELAYED_RELEASE_CAPSULE | Freq: Every day | ORAL | 3 refills | Status: DC

## 2019-09-22 NOTE — Op Note (Signed)
[place] [name] Procedure Date: 09/22/2019 1:33 PM MRN: [id number] Endoscopist: Gatha Mayer , MD Age: 63 Referring MD:  Date of Birth: 05/18/1956 Gender: Male Account #: [id number] Procedure:                Upper GI endoscopy Indications:              Dysphagia Medicines:                Propofol per Anesthesia, Monitored Anesthesia Care Procedure:                Pre-Anesthesia Assessment:                           - Prior to the procedure, a History and Physical                            was performed, and patient medications and                            allergies were reviewed. The patient's tolerance of                            previous anesthesia was also reviewed. The risks                            and benefits of the procedure and the sedation                            options and risks were discussed with the patient.                            All questions were answered, and informed consent                            was obtained. Prior Anticoagulants: The patient has                            taken no previous anticoagulant or antiplatelet                            agents. ASA Grade Assessment: II - A patient with                            mild systemic disease. After reviewing the risks                            and benefits, the patient was deemed in                            satisfactory condition to undergo the procedure.                           After obtaining informed consent, the endoscope was  passed under direct vision. Throughout the                            procedure, the patient's blood pressure, pulse, and                            oxygen saturations were monitored continuously. The                            Endoscope was introduced through the mouth, and                            advanced to the second part of duodenum. The upper                            GI endoscopy was  accomplished without difficulty.                            The patient tolerated the procedure well. Scope In: Scope Out: Findings:                 One benign-appearing, intrinsic moderate stenosis                            was found 37 cm from the incisors. This stenosis                            measured 1.4 cm (inner diameter) x less than one cm                            (in length). The stenosis was traversed. A TTS                            dilator was passed through the scope. Dilation with                            a 16-17-18 mm balloon dilator was performed to 18                            mm. The dilation site was examined and showed mild                            mucosal disruption. Estimated blood loss was                            minimal.                           LA Grade C (one or more mucosal breaks continuous                            between tops of 2 or more mucosal folds, less than  75% circumference) esophagitis with no bleeding was                            found in the distal esophagus. Biopsies were taken                            with a cold forceps for histology. Verification of                            patient identification for the specimen was done.                            Estimated blood loss was minimal.                           A 3 cm sliding hiatal hernia was found. The hiatal                            narrowing was 40 cm from the incisors. The Z-line                            was 37 cm from the incisors.                           Patchy mild inflammation characterized by erosions,                            erythema, granularity and linear erosions was found                            in the prepyloric region of the stomach. Biopsies                            were taken with a cold forceps for histology.                            Verification of patient identification for the                             specimen was done. Estimated blood loss was minimal.                           The exam was otherwise without abnormality.                           The cardia and gastric fundus were normal on                            retroflexion. Complications:            No immediate complications. Estimated Blood Loss:     Estimated blood loss was minimal. Impression:               - Benign-appearing esophageal stenosis. Dilated.                           -  LA Grade C reflux esophagitis with no bleeding.                            Biopsied.                           - 3 cm sliding hiatal hernia.                           - Gastritis. Biopsied.                           - The examination was otherwise normal. Recommendation:           - Patient has a contact number available for                            emergencies. The signs and symptoms of potential                            delayed complications were discussed with the                            patient. Return to normal activities tomorrow.                            Written discharge instructions were provided to the                            patient.                           - Clear liquids x 1 hour then soft foods rest of                            day. Start prior diet tomorrow.                           - Continue present medications.                           - Follow an antireflux regimen.                           - Use Nexium (esomeprazole) 40 mg PO daily. Gatha Mayer, MD 09/22/2019 2:33:19 PM This report has been signed electronically.

## 2019-09-22 NOTE — Op Note (Signed)
[facility] [name] Procedure Date: 09/22/2019 1:33 PM MRN: [id number] Endoscopist: Gatha Mayer , MD Age: 63 Referring MD:  Date of Birth: 1956-06-04 Gender: Male Account #: [id number] Procedure:                Colonoscopy Indications:              Surveillance: Personal history of adenomatous                            polyps on last colonoscopy > 5 years ago Medicines:                Propofol per Anesthesia, Monitored Anesthesia Care Procedure:                Pre-Anesthesia Assessment:                           - Prior to the procedure, a History and Physical                            was performed, and patient medications and                            allergies were reviewed. The patient's tolerance of                            previous anesthesia was also reviewed. The risks                            and benefits of the procedure and the sedation                            options and risks were discussed with the patient.                            All questions were answered, and informed consent                            was obtained. Prior Anticoagulants: The patient has                            taken no previous anticoagulant or antiplatelet                            agents. ASA Grade Assessment: II - A patient with                            mild systemic disease. After reviewing the risks                            and benefits, the patient was deemed in                            satisfactory condition to undergo the procedure.  After obtaining informed consent, the colonoscope                            was passed under direct vision. Throughout the                            procedure, the patient's blood pressure, pulse, and                            oxygen saturations were monitored continuously. The                            Colonoscope was introduced through the anus and   advanced to the the cecum, identified by                            appendiceal orifice and ileocecal valve. The                            colonoscopy was performed without difficulty. The                            patient tolerated the procedure well. The quality                            of the bowel preparation was excellent. The                            ileocecal valve, appendiceal orifice, and rectum                            were photographed. The bowel preparation used was                            Miralax via split dose instruction. Scope In: 1:57:58 PM Scope Out: 2:14:48 PM Scope Withdrawal Time: 0 hours 13 minutes 37 seconds  Total Procedure Duration: 0 hours 16 minutes 50 seconds  Findings:                 The perianal and digital rectal examinations were                            normal. Pertinent negatives include normal prostate                            (size, shape, and consistency).                           The colon (entire examined portion) appeared normal.                           No additional abnormalities were found on                            retroflexion. Complications:  No immediate complications. Estimated Blood Loss:     Estimated blood loss: none. Impression:               - The entire examined colon is normal.                           - No specimens collected.                           - Personal history of colonic polyp diminutive                            adenoma 2011. Recommendation:           - Patient has a contact number available for                            emergencies. The signs and symptoms of potential                            delayed complications were discussed with the                            patient. Return to normal activities tomorrow.                            Written discharge instructions were provided to the                            patient.                           - Resume previous diet.                            - Continue present medications.                           - Repeat colonoscopy in 10 years. Gatha Mayer, MD 09/22/2019 2:36:55 PM This report has been signed electronically.

## 2019-09-22 NOTE — Progress Notes (Signed)
PT taken to PACU. Monitors in place. VSS. Report given to RN. 

## 2019-09-22 NOTE — Patient Instructions (Addendum)
I found a stricture in the esophagus - and inflammation. All likely from acid reflux. There is an associated hiatal hernia - read about this in the handouts.  I took biopsies and dilated the esophagus. I have prescribed esomeprazole to treat this.  I also found stomach inflammation and biopsied that.  I think you will feel better and be able to swallow better soon but it will require chronic medical treatment and diet changes can help.   The colonoscopy was fine - no polyps.  I appreciate the opportunity to care for you. Gatha Mayer, MD, FACG   Clear liquid diet until 3:30pm today.  Soft diet rest of today.  Start regular diet tomorrow - Thursday.  Information on hiatal hernia, gastritis and esophagitis given to you today.  \YOU HAD AN ENDOSCOPIC PROCEDURE TODAY AT THE [facility]:   Refer to the procedure report that was given to you for any specific questions about what was found during the examination.  If the procedure report does not answer your questions, please call your gastroenterologist to clarify.  If you requested that your care partner not be given the details of your procedure findings, then the procedure report has been included in a sealed envelope for you to review at your convenience later.  YOU SHOULD EXPECT: Some feelings of bloating in the abdomen. Passage of more gas than usual.  Walking can help get rid of the air that was put into your GI tract during the procedure and reduce the bloating. If you had a lower endoscopy (such as a colonoscopy or flexible sigmoidoscopy) you may notice spotting of blood in your stool or on the toilet paper. If you underwent a bowel prep for your procedure, you may not have a normal bowel movement for a few days.  Please Note:  You might notice some irritation and congestion in your nose or some drainage.  This is from the oxygen used during your procedure.  There is no need for concern and it should clear up in a day or  so.  SYMPTOMS TO REPORT IMMEDIATELY:   Following lower endoscopy (colonoscopy or flexible sigmoidoscopy):  Excessive amounts of blood in the stool  Significant tenderness or worsening of abdominal pains  Swelling of the abdomen that is new, acute  Fever of 100F or higher   Following upper endoscopy (EGD)  Vomiting of blood or coffee ground material  New chest pain or pain under the shoulder blades  Painful or persistently difficult swallowing  New shortness of breath  Fever of 100F or higher  Black, tarry-looking stools  For urgent or emergent issues, a gastroenterologist can be reached at any hour by calling [phone].   DIET:  We do recommend a small meal at first, but then you may proceed to your regular diet.  Drink plenty of fluids but you should avoid alcoholic beverages for 24 hours.  ACTIVITY:  You should plan to take it easy for the rest of today and you should NOT DRIVE or use heavy machinery until tomorrow (because of the sedation medicines used during the test).    FOLLOW UP: Our staff will call the number listed on your records 48-72 hours following your procedure to check on you and address any questions or concerns that you may have regarding the information given to you following your procedure. If we do not reach you, we will leave a message.  We will attempt to reach you two times.  During this call, we  will ask if you have developed any symptoms of COVID 19. If you develop any symptoms (ie: fever, flu-like symptoms, shortness of breath, cough etc.) before then, please call [phone].  If you test positive for Covid 19 in the 2 weeks post procedure, please call and report this information to [place].    If any biopsies were taken you will be contacted by phone or by letter within the next 1-3 weeks.  Please call us at [phone] if you have not heard about the biopsies in 3 weeks.    SIGNATURES/CONFIDENTIALITY: You and/or your care partner have signed  paperwork which will be entered into your electronic medical record.  These signatures attest to the fact that that the information above on your After Visit Summary has been reviewed and is understood.  Full responsibility of the confidentiality of this discharge information lies with you and/or your care-partner.

## 2019-09-22 NOTE — Progress Notes (Signed)
Called to room to assist during endoscopic procedure.  Patient ID and intended procedure confirmed with present staff. Received instructions for my participation in the procedure from the performing physician.  

## 2019-09-22 NOTE — Progress Notes (Signed)
Temp  JB  VS DT  Pt's states no medical or surgical changes since previsit or office visit. Admitting RN reviewed   

## 2019-09-27 ENCOUNTER — Other Ambulatory Visit: Payer: Self-pay

## 2019-09-27 ENCOUNTER — Telehealth: Payer: Self-pay

## 2019-09-27 ENCOUNTER — Encounter: Payer: Self-pay | Admitting: Physical Therapy

## 2019-09-27 ENCOUNTER — Ambulatory Visit: Payer: 59 | Attending: Specialist | Admitting: Physical Therapy

## 2019-09-27 DIAGNOSIS — M25562 Pain in left knee: Secondary | ICD-10-CM | POA: Insufficient documentation

## 2019-09-27 DIAGNOSIS — M25662 Stiffness of left knee, not elsewhere classified: Secondary | ICD-10-CM | POA: Insufficient documentation

## 2019-09-27 DIAGNOSIS — G8929 Other chronic pain: Secondary | ICD-10-CM | POA: Diagnosis present

## 2019-09-27 DIAGNOSIS — R600 Localized edema: Secondary | ICD-10-CM | POA: Insufficient documentation

## 2019-09-27 NOTE — Telephone Encounter (Signed)
First attempt follow up call to pt, lm on vm 

## 2019-09-27 NOTE — Telephone Encounter (Signed)
  Follow up Call-  Call back number 09/22/2019  Post procedure Call Back phone  # [phone]  Permission to leave phone message Yes  Some recent data might be hidden     Patient questions:  Do you have a fever, pain , or abdominal swelling? No. Pain Score  0 *  Have you tolerated food without any problems? Yes.    Have you been able to return to your normal activities? Yes.    Do you have any questions about your discharge instructions: Diet   No. Medications  No. Follow up visit  No.  Do you have questions or concerns about your Care? No.  Actions: * If pain score is 4 or above: No action needed, pain <4.  1. Have you developed a fever since your procedure? No    2.   Have you had an respiratory symptoms (SOB or cough) since your procedure? no  3.   Have you tested positive for COVID 19 since your procedure no  4.   Have you had any family members/close contacts diagnosed with the COVID 19 since your procedure?  *no**   If yes to any of these questions please route to Joylene John, RN and Alphonsa Gin, RN.
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
## 2019-09-28 ENCOUNTER — Encounter: Payer: Self-pay | Admitting: Internal Medicine

## 2019-09-29 ENCOUNTER — Encounter: Payer: 59 | Admitting: Physical Therapy

## 2019-09-30 ENCOUNTER — Encounter: Payer: Self-pay | Admitting: Physical Therapy

## 2019-09-30 ENCOUNTER — Other Ambulatory Visit: Payer: Self-pay

## 2019-09-30 ENCOUNTER — Ambulatory Visit: Payer: 59 | Admitting: Physical Therapy

## 2019-09-30 DIAGNOSIS — G8929 Other chronic pain: Secondary | ICD-10-CM

## 2019-09-30 DIAGNOSIS — M25562 Pain in left knee: Secondary | ICD-10-CM | POA: Diagnosis not present

## 2019-09-30 DIAGNOSIS — R600 Localized edema: Secondary | ICD-10-CM

## 2019-09-30 DIAGNOSIS — M25662 Stiffness of left knee, not elsewhere classified: Secondary | ICD-10-CM
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
## 2019-10-04 ENCOUNTER — Other Ambulatory Visit: Payer: Self-pay

## 2019-10-04 ENCOUNTER — Encounter: Payer: Self-pay | Admitting: Physical Therapy

## 2019-10-04 ENCOUNTER — Ambulatory Visit: Payer: 59 | Admitting: Physical Therapy

## 2019-10-04 DIAGNOSIS — G8929 Other chronic pain: Secondary | ICD-10-CM

## 2019-10-04 DIAGNOSIS — R600 Localized edema: Secondary | ICD-10-CM

## 2019-10-04 DIAGNOSIS — M25662 Stiffness of left knee, not elsewhere classified: Secondary | ICD-10-CM

## 2019-10-04 DIAGNOSIS — M25562 Pain in left knee: Secondary | ICD-10-CM | POA: Diagnosis not present
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
## 2019-10-06 ENCOUNTER — Ambulatory Visit: Payer: 59 | Admitting: Physical Therapy

## 2019-10-06 ENCOUNTER — Other Ambulatory Visit: Payer: Self-pay

## 2019-10-06 ENCOUNTER — Encounter: Payer: Self-pay | Admitting: Physical Therapy

## 2019-10-06 DIAGNOSIS — R600 Localized edema: Secondary | ICD-10-CM

## 2019-10-06 DIAGNOSIS — M25662 Stiffness of left knee, not elsewhere classified: Secondary | ICD-10-CM

## 2019-10-06 DIAGNOSIS — M25562 Pain in left knee: Secondary | ICD-10-CM | POA: Diagnosis not present

## 2019-10-06 DIAGNOSIS — G8929 Other chronic pain: Secondary | ICD-10-CM
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
## 2019-10-12 ENCOUNTER — Ambulatory Visit: Payer: 59 | Admitting: Physical Therapy

## 2019-10-12 ENCOUNTER — Other Ambulatory Visit: Payer: Self-pay

## 2019-10-12 DIAGNOSIS — G8929 Other chronic pain: Secondary | ICD-10-CM

## 2019-10-12 DIAGNOSIS — M25562 Pain in left knee: Secondary | ICD-10-CM | POA: Diagnosis not present

## 2019-10-12 DIAGNOSIS — M25662 Stiffness of left knee, not elsewhere classified: Secondary | ICD-10-CM

## 2019-10-12 DIAGNOSIS — R600 Localized edema: Secondary | ICD-10-CM
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
## 2019-10-14 ENCOUNTER — Encounter: Payer: Self-pay | Admitting: Physical Therapy

## 2019-10-14 ENCOUNTER — Ambulatory Visit: Payer: 59 | Admitting: Physical Therapy

## 2019-10-14 ENCOUNTER — Other Ambulatory Visit: Payer: Self-pay

## 2019-10-14 DIAGNOSIS — G8929 Other chronic pain: Secondary | ICD-10-CM

## 2019-10-14 DIAGNOSIS — M25562 Pain in left knee: Secondary | ICD-10-CM

## 2019-10-14 DIAGNOSIS — M25662 Stiffness of left knee, not elsewhere classified: Secondary | ICD-10-CM

## 2019-10-14 DIAGNOSIS — R600 Localized edema: Secondary | ICD-10-CM
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
## 2019-10-19 ENCOUNTER — Encounter: Payer: Self-pay | Admitting: Physical Therapy

## 2019-10-19 ENCOUNTER — Other Ambulatory Visit: Payer: Self-pay

## 2019-10-19 ENCOUNTER — Ambulatory Visit: Payer: 59 | Admitting: Physical Therapy

## 2019-10-19 DIAGNOSIS — M25562 Pain in left knee: Secondary | ICD-10-CM | POA: Diagnosis not present

## 2019-10-19 DIAGNOSIS — G8929 Other chronic pain: Secondary | ICD-10-CM

## 2019-10-19 DIAGNOSIS — M25662 Stiffness of left knee, not elsewhere classified: Secondary | ICD-10-CM

## 2019-10-19 DIAGNOSIS — R600 Localized edema: Secondary | ICD-10-CM
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
## 2019-10-21 ENCOUNTER — Ambulatory Visit: Payer: 59 | Admitting: Physical Therapy

## 2019-10-21 ENCOUNTER — Other Ambulatory Visit: Payer: Self-pay

## 2019-10-21 DIAGNOSIS — M25562 Pain in left knee: Secondary | ICD-10-CM | POA: Diagnosis not present

## 2019-10-21 DIAGNOSIS — M25662 Stiffness of left knee, not elsewhere classified: Secondary | ICD-10-CM

## 2019-10-21 DIAGNOSIS — G8929 Other chronic pain: Secondary | ICD-10-CM

## 2019-10-21 DIAGNOSIS — R600 Localized edema: Secondary | ICD-10-CM
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
## 2019-10-26 ENCOUNTER — Ambulatory Visit: Payer: 59 | Attending: Specialist | Admitting: Physical Therapy

## 2019-10-26 ENCOUNTER — Encounter: Payer: Self-pay | Admitting: Physical Therapy

## 2019-10-26 ENCOUNTER — Other Ambulatory Visit: Payer: Self-pay

## 2019-10-26 DIAGNOSIS — M25562 Pain in left knee: Secondary | ICD-10-CM | POA: Diagnosis present

## 2019-10-26 DIAGNOSIS — R600 Localized edema: Secondary | ICD-10-CM | POA: Diagnosis present

## 2019-10-26 DIAGNOSIS — M25662 Stiffness of left knee, not elsewhere classified: Secondary | ICD-10-CM | POA: Diagnosis present

## 2019-10-26 DIAGNOSIS — G8929 Other chronic pain: Secondary | ICD-10-CM | POA: Diagnosis present
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
## 2019-10-28 ENCOUNTER — Encounter: Payer: Self-pay | Admitting: Physical Therapy

## 2019-10-28 ENCOUNTER — Ambulatory Visit: Payer: 59 | Admitting: Physical Therapy

## 2019-10-28 ENCOUNTER — Other Ambulatory Visit: Payer: Self-pay

## 2019-10-28 DIAGNOSIS — R600 Localized edema: Secondary | ICD-10-CM | POA: Diagnosis not present

## 2019-10-28 DIAGNOSIS — G8929 Other chronic pain: Secondary | ICD-10-CM

## 2019-10-28 DIAGNOSIS — M25662 Stiffness of left knee, not elsewhere classified: Secondary | ICD-10-CM
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
## 2019-11-09 ENCOUNTER — Telehealth: Payer: Self-pay | Admitting: Family Medicine

## 2019-11-09 NOTE — Telephone Encounter (Signed)
Called patient and advised per office policy we would not be able to bring him in for rash but offered televisit or video visit. Patient states it is in private area and does not want video visit. Advised patient telephone visit available and patient hung up the phone.

## 2020-03-20 ENCOUNTER — Other Ambulatory Visit: Payer: Self-pay | Admitting: Family Medicine

## 2020-03-20 DIAGNOSIS — I10 Essential (primary) hypertension: Secondary | ICD-10-CM

## 2020-03-20 DIAGNOSIS — R609 Edema, unspecified: Secondary | ICD-10-CM

## 2020-04-13 ENCOUNTER — Other Ambulatory Visit: Payer: Self-pay | Admitting: Family Medicine

## 2020-04-13 DIAGNOSIS — I10 Essential (primary) hypertension: Secondary | ICD-10-CM

## 2020-04-13 DIAGNOSIS — R609 Edema, unspecified: Secondary | ICD-10-CM

## 2020-04-13 MED ORDER — AMLODIPINE BESYLATE 5 MG PO TABS: 5.0000 mg | ORAL_TABLET | Freq: Every day | ORAL | 0 refills | Status: DC

## 2020-04-13 NOTE — Telephone Encounter (Signed)
Appt made for 05/25/20

## 2020-04-13 NOTE — Telephone Encounter (Signed)
Last OV 09/01/2019. Last RF 30 day supply given 03/20/20. Next OV not scheduled  Refill denied-ntbs for refills

## 2020-05-14 ENCOUNTER — Other Ambulatory Visit: Payer: Self-pay | Admitting: Family Medicine

## 2020-05-14 DIAGNOSIS — R609 Edema, unspecified: Secondary | ICD-10-CM

## 2020-05-14 DIAGNOSIS — I10 Essential (primary) hypertension: Secondary | ICD-10-CM

## 2020-05-25 ENCOUNTER — Ambulatory Visit: Payer: 59 | Admitting: Family Medicine

## 2020-06-03 ENCOUNTER — Other Ambulatory Visit: Payer: Self-pay

## 2020-06-03 ENCOUNTER — Encounter (HOSPITAL_COMMUNITY): Payer: Self-pay

## 2020-06-03 ENCOUNTER — Emergency Department (HOSPITAL_COMMUNITY): Payer: 59

## 2020-06-03 ENCOUNTER — Emergency Department (HOSPITAL_COMMUNITY)
Admission: EM | Admit: 2020-06-03 | Discharge: 2020-06-04 | Disposition: A | Discharge status: Home / Self Care | Payer: 59 | Attending: Emergency Medicine | Admitting: Emergency Medicine

## 2020-06-03 DIAGNOSIS — R42 Dizziness and giddiness: Secondary | ICD-10-CM | POA: Diagnosis present

## 2020-06-03 DIAGNOSIS — Z20822 Contact with and (suspected) exposure to covid-19: Secondary | ICD-10-CM | POA: Diagnosis not present

## 2020-06-03 DIAGNOSIS — Z79899 Other long term (current) drug therapy: Secondary | ICD-10-CM | POA: Insufficient documentation

## 2020-06-03 DIAGNOSIS — R911 Solitary pulmonary nodule: Secondary | ICD-10-CM | POA: Diagnosis not present

## 2020-06-03 DIAGNOSIS — I712 Thoracic aortic aneurysm, without rupture, unspecified: Secondary | ICD-10-CM

## 2020-06-03 DIAGNOSIS — R55 Syncope and collapse: Secondary | ICD-10-CM

## 2020-06-03 DIAGNOSIS — I10 Essential (primary) hypertension: Secondary | ICD-10-CM | POA: Diagnosis not present

## 2020-06-03 LAB — BASIC METABOLIC PANEL
Anion gap: 11 (ref 5–15)
BUN: 17 mg/dL (ref 8–23)
CO2: 24 mmol/L (ref 22–32)
Calcium: 9.6 mg/dL (ref 8.9–10.3)
Chloride: 107 mmol/L (ref 98–111)
Creatinine, Ser: 1.41 mg/dL — ABNORMAL HIGH (ref 0.61–1.24)
GFR calc Af Amer: >60 mL/min (ref >60)
GFR calc non Af Amer: 52 mL/min — ABNORMAL LOW (ref >60)
Glucose, Bld: 104 mg/dL — ABNORMAL HIGH (ref 70–99)
Potassium: 3.8 mmol/L (ref 3.5–5.1)
Sodium: 142 mmol/L (ref 135–145)

## 2020-06-03 LAB — CBC
HCT: 46 % (ref 39.0–52.0)
Hemoglobin: 15 g/dL (ref 13.0–17.0)
MCH: 28.4 pg (ref 26.0–34.0)
MCHC: 32.6 g/dL (ref 30.0–36.0)
MCV: 87.1 fL (ref 80.0–100.0)
Platelets: 265 10*3/uL (ref 150–400)
RBC: 5.28 MIL/uL (ref 4.22–5.81)
RDW: 12.4 % (ref 11.5–15.5)
WBC: 6.9 10*3/uL (ref 4.0–10.5)
nRBC: 0 % (ref 0.0–0.2)

## 2020-06-03 LAB — TROPONIN I (HIGH SENSITIVITY)
Troponin I (High Sensitivity): 5 ng/L (ref <18)
Troponin I (High Sensitivity): 4 ng/L (ref <18)

## 2020-06-03 NOTE — ED Triage Notes (Signed)
Patient sent from urgent care for further evaluation of syncopal event yesterday. Patient on assessment alert and oriented. Reports after event had episode of dizziness. Patient reports multiple episodes of dizziness since initial event. Complains of episode of chest pain this am and unsure if related to the trauma related to syncopal event

## 2020-06-04 ENCOUNTER — Emergency Department (HOSPITAL_COMMUNITY): Payer: 59

## 2020-06-04 LAB — SARS CORONAVIRUS 2 BY RT PCR (HOSPITAL ORDER, PERFORMED IN ~~LOC~~ HOSPITAL LAB): SARS Coronavirus 2: NEGATIVE

## 2020-06-04 MED ORDER — IOHEXOL 350 MG/ML SOLN
100.0000 mL | Freq: Once | INTRAVENOUS | Status: AC | PRN
  Administered 2020-06-04: 42 mL via INTRAVENOUS

## 2020-06-04 MED ORDER — SODIUM CHLORIDE 0.9 % IV BOLUS
1000.0000 mL | Freq: Once | INTRAVENOUS | Status: AC
  Administered 2020-06-04: 1000 mL via INTRAVENOUS

## 2020-06-04 NOTE — Discharge Instructions (Addendum)
Your Covid test was negative.  However, I would very strongly encourage you to get yourself vaccinated for Covid.  I included a copy of your blood tests and your CT scan. We went over all of this in the room.  I would strongly recommend that you follow-up with a cardiologist.  As I explained, our heart tests in the ER are not a definitive rule out for heart disease.  You may need more advanced testing such as a stress test or an echocardiogram.  You would benefit from being seen by cardiologist.  Please call their office tomorrow and ask for an appointment.  You should return to the hospital if you have any new or concerning symptoms, including worsening or sudden chest pain, arm numbness or weakness, severe headache, difficulty breathing, facial droop, or slurred speech.
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
## 2020-06-09 ENCOUNTER — Encounter: Payer: Self-pay | Admitting: Family Medicine

## 2020-06-09 ENCOUNTER — Other Ambulatory Visit: Payer: Self-pay

## 2020-06-09 ENCOUNTER — Telehealth: Payer: Self-pay | Admitting: Family Medicine

## 2020-06-09 ENCOUNTER — Ambulatory Visit: Payer: 59 | Admitting: Family Medicine

## 2020-06-09 ENCOUNTER — Other Ambulatory Visit: Payer: Self-pay | Admitting: Family Medicine

## 2020-06-09 VITALS — BP 135/93 | HR 83 | Temp 98.2°F | Resp 20 | Ht 70.0 in | Wt 211.0 lb

## 2020-06-09 DIAGNOSIS — R609 Edema, unspecified: Secondary | ICD-10-CM

## 2020-06-09 DIAGNOSIS — R079 Chest pain, unspecified: Secondary | ICD-10-CM | POA: Diagnosis not present

## 2020-06-09 DIAGNOSIS — Z23 Encounter for immunization: Secondary | ICD-10-CM | POA: Diagnosis not present

## 2020-06-09 DIAGNOSIS — R55 Syncope and collapse: Secondary | ICD-10-CM | POA: Diagnosis not present

## 2020-06-09 DIAGNOSIS — I10 Essential (primary) hypertension: Secondary | ICD-10-CM
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
## 2020-06-12 ENCOUNTER — Telehealth: Payer: Self-pay | Admitting: Family Medicine

## 2020-06-12 NOTE — Telephone Encounter (Signed)
Pt says that he viewed his note from 06/09/2020 ov with [employer] and it does not mention about him getting a shot. I asked what kind it was and he said "just have them enter what shot I was given."

## 2020-06-13 ENCOUNTER — Ambulatory Visit: Payer: 59 | Admitting: Family Medicine

## 2020-08-15 ENCOUNTER — Other Ambulatory Visit: Payer: Self-pay | Admitting: Family Medicine

## 2020-08-15 DIAGNOSIS — R600 Localized edema: Secondary | ICD-10-CM

## 2020-08-15 DIAGNOSIS — R609 Edema, unspecified: Secondary | ICD-10-CM

## 2020-08-15 DIAGNOSIS — I10 Essential (primary) hypertension: Secondary | ICD-10-CM

## 2021-01-09 DIAGNOSIS — D225 Melanocytic nevi of trunk: Secondary | ICD-10-CM | POA: Diagnosis not present

## 2021-01-09 DIAGNOSIS — D485 Neoplasm of uncertain behavior of skin: Secondary | ICD-10-CM | POA: Diagnosis not present

## 2021-01-09 DIAGNOSIS — L814 Other melanin hyperpigmentation: Secondary | ICD-10-CM | POA: Diagnosis not present

## 2021-01-09 DIAGNOSIS — L820 Inflamed seborrheic keratosis: Secondary | ICD-10-CM | POA: Diagnosis not present

## 2021-01-09 DIAGNOSIS — L570 Actinic keratosis: Secondary | ICD-10-CM | POA: Diagnosis not present

## 2021-01-09 DIAGNOSIS — L821 Other seborrheic keratosis: Secondary | ICD-10-CM | POA: Diagnosis not present

## 2021-04-17 IMAGING — CR DG CHEST 2V
2 series · 2 of 2 positions shown · non-contrast
Comparison: December 17, 2016

CLINICAL DATA: Chest pain

EXAM:
CHEST - 2 VIEW

[chest pa]
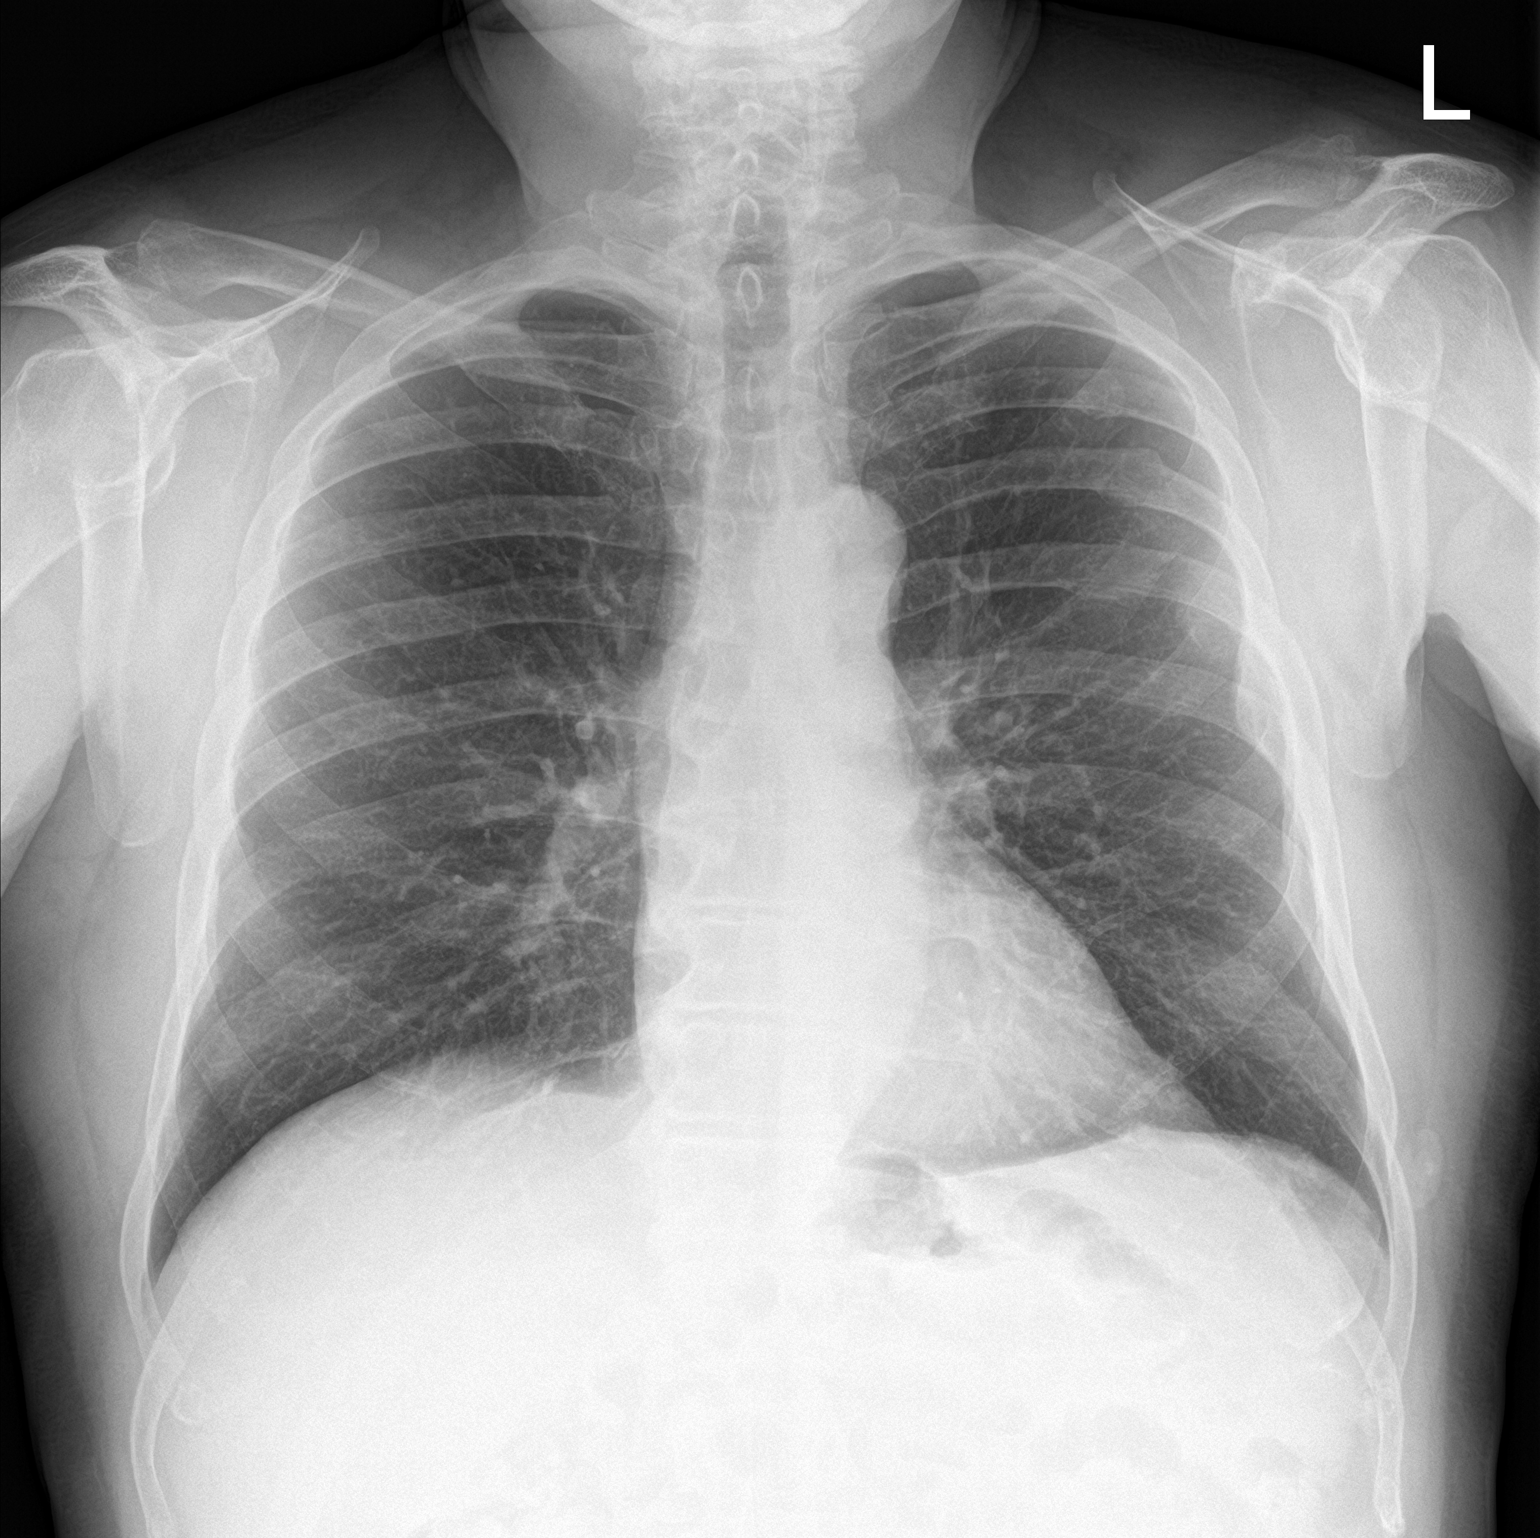

[chest lat]
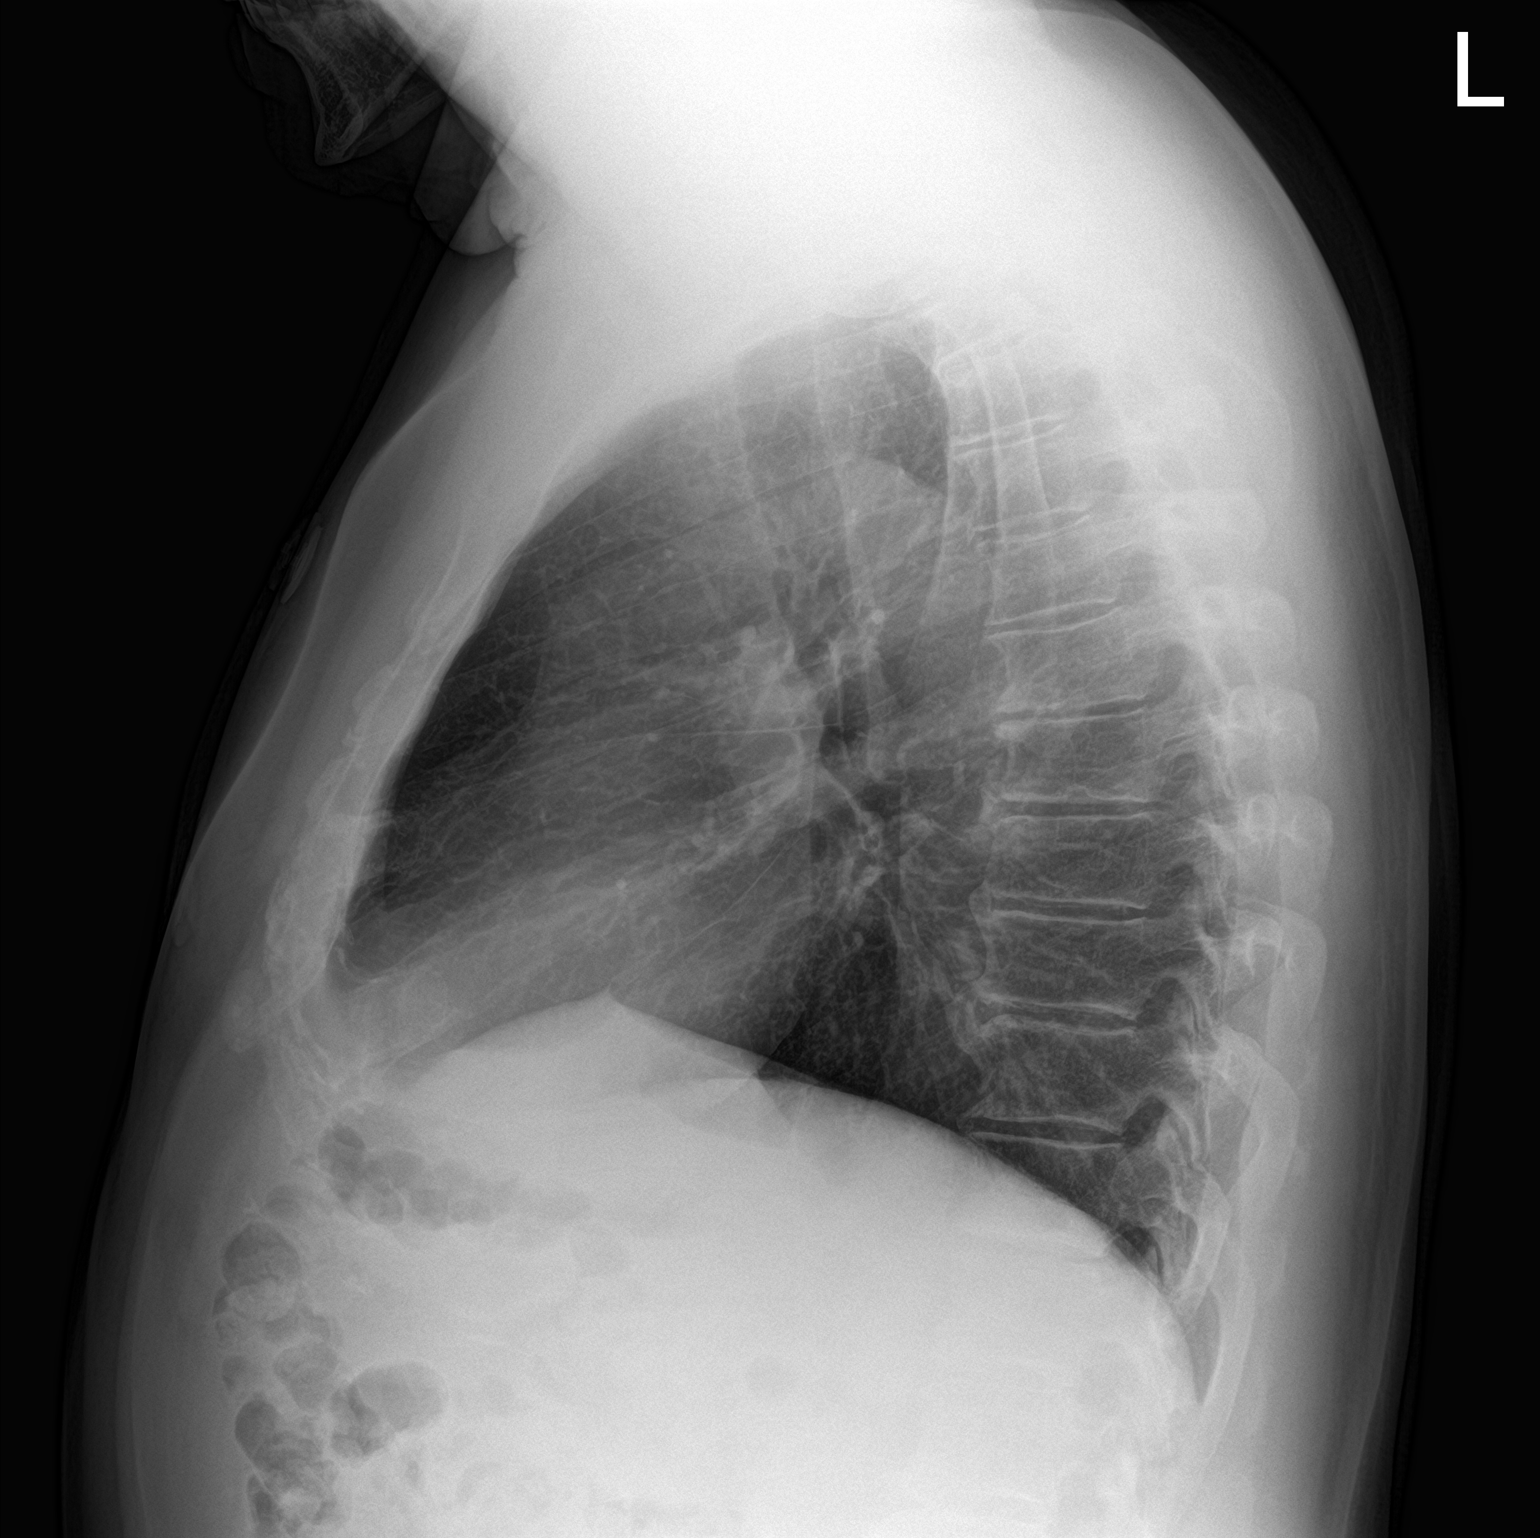

[2 of 2 positions shown; findings below may reference images not displayed]

FINDINGS: Lungs are clear. Heart size and pulmonary vascularity are normal. No
adenopathy. No pneumothorax. Old healed rib fractures again noted on
the left with bony remodeling.
IMPRESSION: No edema or airspace opacity.  Heart size normal.

## 2021-04-18 IMAGING — CT CT ANGIO CHEST
2 of 6 series · 17 of 46 positions shown · IV contrast (APPLIED)
Comparison: None.

CLINICAL DATA: 64-year-old with [id number], presenting with 2 week
history of dizziness. Patient had a syncopal episode 2 days ago
associated with LEFT-sided chest pain.

EXAM:
CT ANGIOGRAPHY CHEST WITH CONTRAST
TECHNIQUE: Multidetector CT imaging of the chest was performed using the
standard protocol during bolus administration of intravenous
contrast. Multiplanar CT image reconstructions and MIPs were
obtained to evaluate the vascular anatomy.
CONTRAST:  42mL OMNIPAQUE IOHEXOL 350 MG/ML IV.

[Series 7: thins · axial · 0.68mm/px · z∈[-311,-18]mm · 14 of 459 slices shown]
[im 20/459  lung]
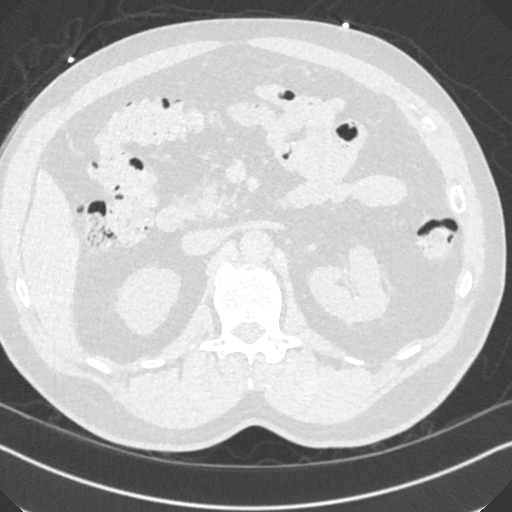
[im 60/459  soft-tissue]
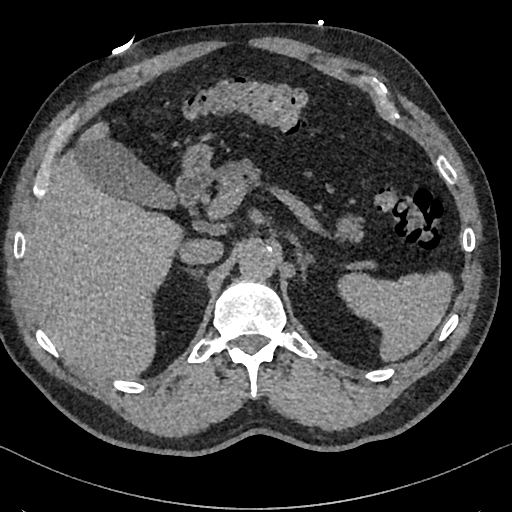
[im 80/459  lung]
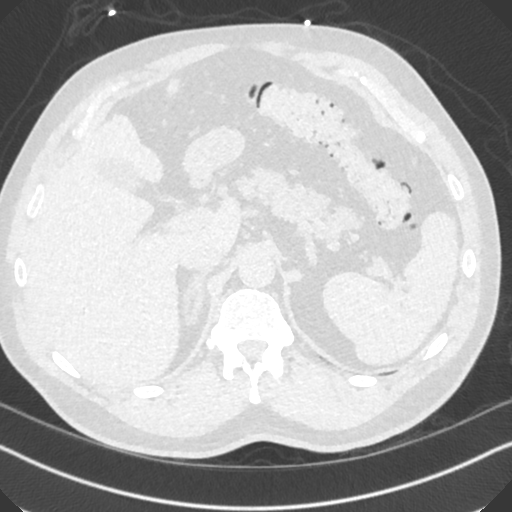
[im 120/459  soft-tissue]
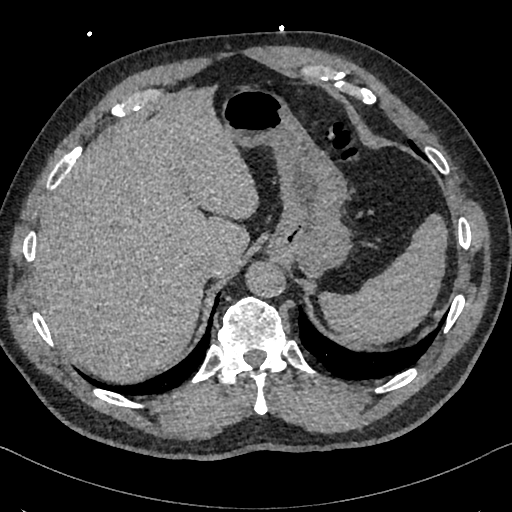
[im 160/459  lung]
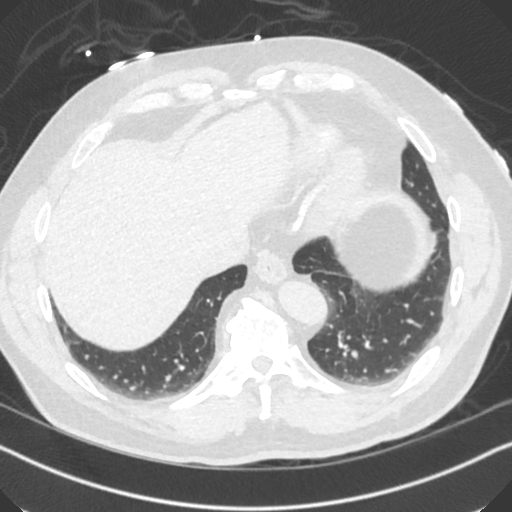
[im 180/459  soft-tissue]
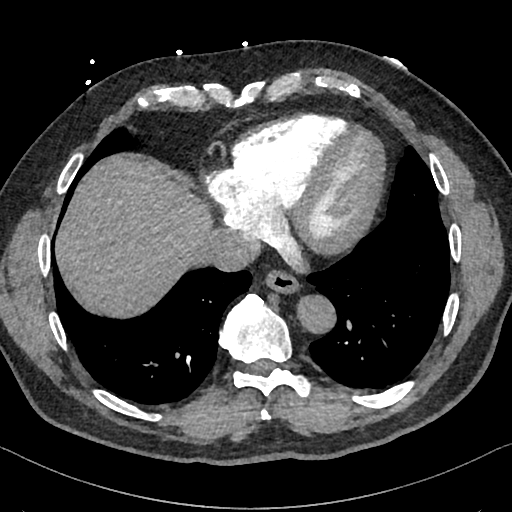
[im 220/459  lung]
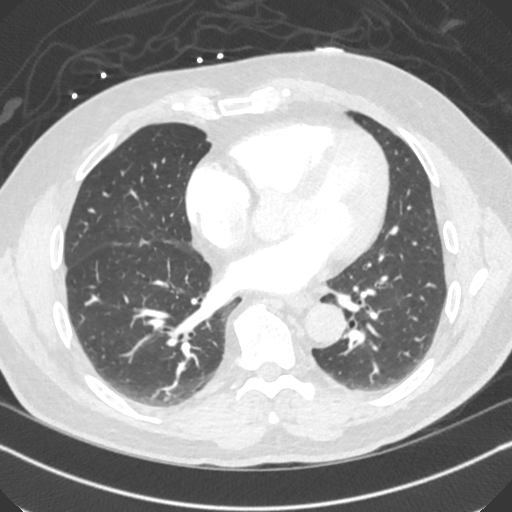
[im 239/459  soft-tissue]
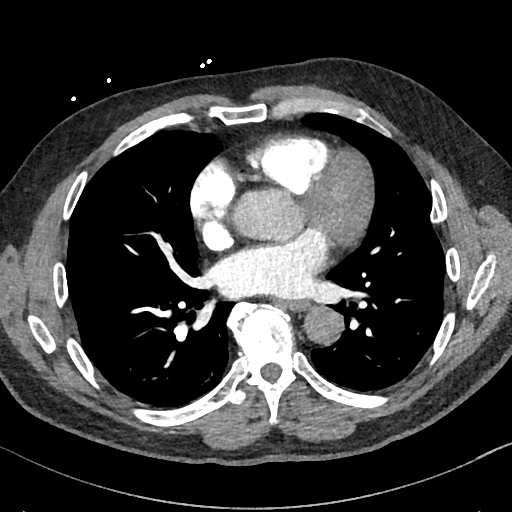
[im 279/459  lung]
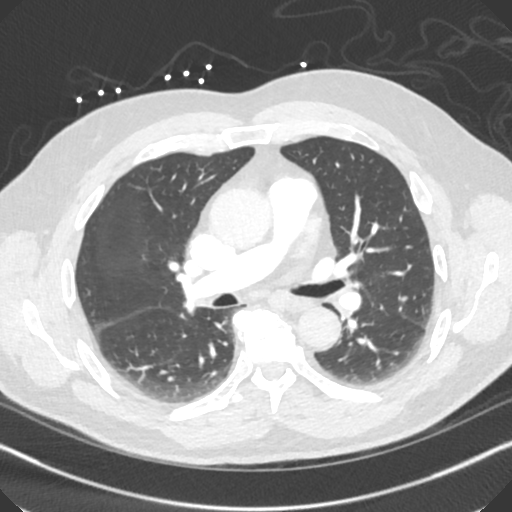
[im 299/459  soft-tissue]
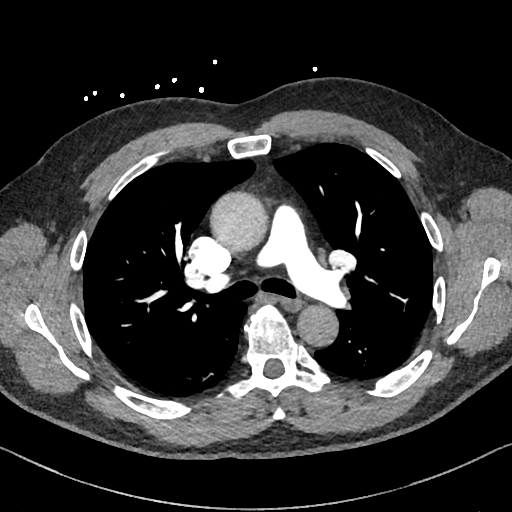
[im 339/459  lung]
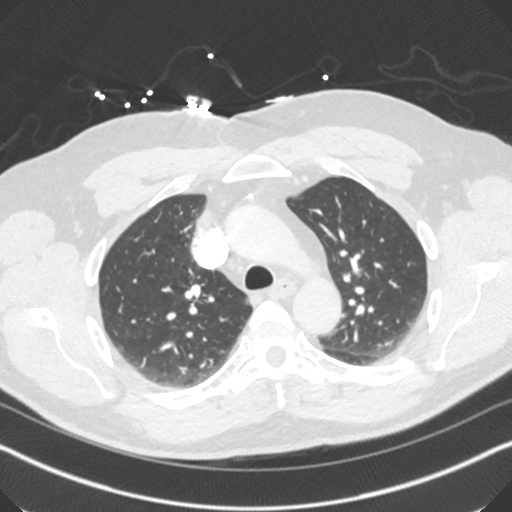
[im 379/459  soft-tissue]
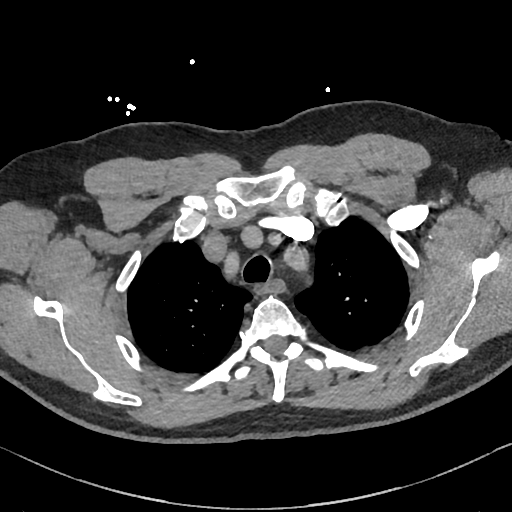
[im 399/459  lung]
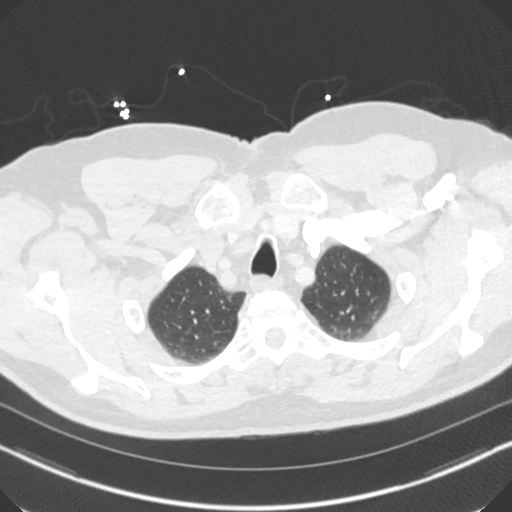
[im 439/459  soft-tissue]
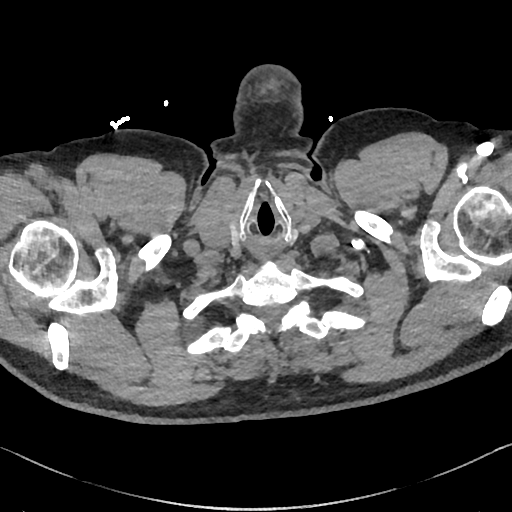

[Series 8: cor · coronal · 0.65mm/px · 3 of 153 slices shown]
[im 39/153  soft-tissue]
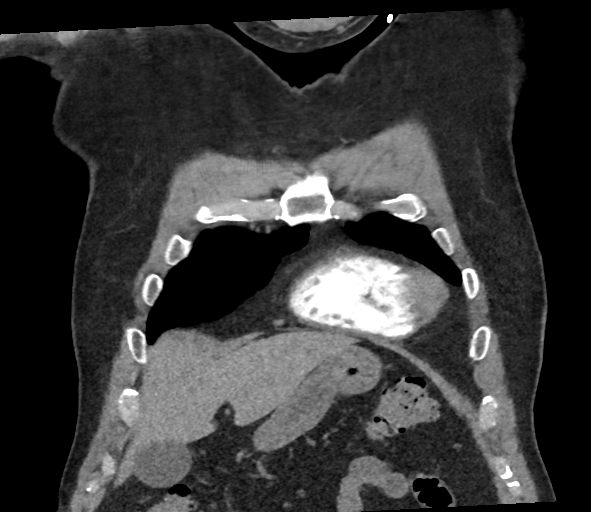
[im 77/153  soft-tissue]
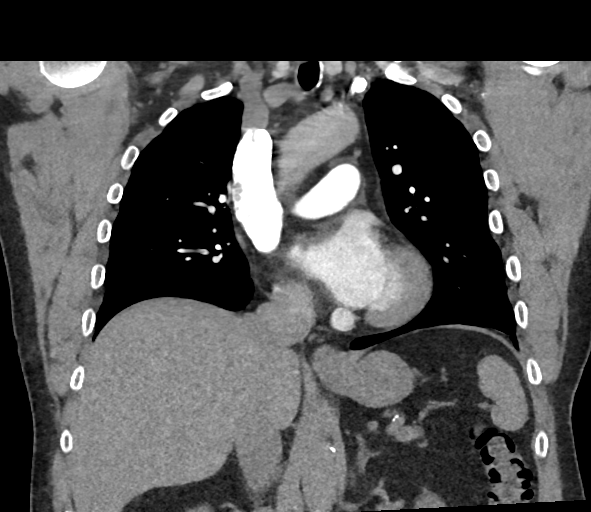
[im 115/153  soft-tissue]
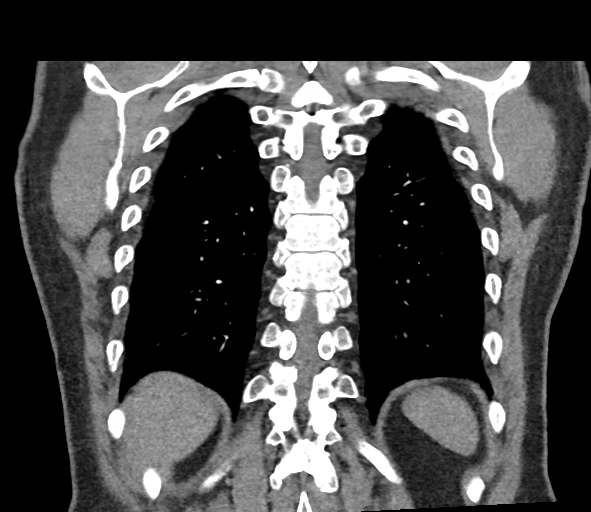

[17 of 46 positions shown; findings below may reference images not displayed]

FINDINGS: Cardiovascular: Contrast opacification of pulmonary arteries is
excellent. No filling defects within either main pulmonary artery or
their segmental branches in either lung to suggest pulmonary
embolism.

Heart mildly enlarged. LEFT ventricular hypertrophy suspected. No
pericardial effusion. No visible coronary atherosclerosis.

Minimal atherosclerosis involving the thoracic and upper abdominal
aorta ascending thoracic aortic aneurysm measuring up to 4.2 cm
diameter.

Mediastinum/Nodes: No pathologically enlarged mediastinal, hilar or
axillary lymph nodes. No mediastinal masses. Normal-appearing
esophagus. Normal-appearing thyroid gland.

Lungs/Pleura: Smoothly marginated pleural based nodule deep in the
RIGHT LOWER LOBE measuring approximately 5 x 4 mm (mean 5 mm),
series 6, image 101). No other nodules of significance in either
lung. Benign subpleural lymph node adjacent to the major fissure
anteriorly on the RIGHT. No confluent or ground-glass consolidation.
No evidence of interstitial lung disease. No pleural effusions.
Central airways patent with moderate bronchial wall thickening.

Upper Abdomen: Non-obstructing calculi involving the upper poles of
both kidneys. Visualized upper abdomen otherwise unremarkable for
the early arterial phase of enhancement.

Musculoskeletal: Diffuse degenerative changes throughout the
thoracic spine, most prominent in the mid and lower thoracic region.
Remote healed fractures of the LEFT lateral third, fourth, fifth and
sixth ribs. No acute findings.

Review of the MIP images confirms the above findings.
IMPRESSION: 1. No evidence of pulmonary embolism.
2. Moderate central bronchial wall thickening indicating asthma
and/or bronchitis. No acute cardiopulmonary disease otherwise.
3. 5 mm pleural based nodule deep in the RIGHT LOWER LOBE, likely
benign. No follow-up needed if patient is low-risk. Non-contrast
chest CT can be considered in 12 months if patient is high-risk.
This recommendation follows the consensus statement: Guidelines for
Management of Incidental Pulmonary Nodules Detected on CT Images:
4. Ascending thoracic aortic aneurysm measuring up to 4.2 cm.
Recommend annual imaging followup by CTA or MRA. This recommendation
follows 5494 ACCF/AHA/AATS/ACR/ASA/SCA/LUCIANUS/MODRITCH/ASCUA/MORE Guidelines
for the Diagnosis and Management of Patients with Thoracic Aortic
Disease. Circulation. 5494; 121: E266-e369. Aortic aneurysm NOS
(BW656-PBB.1)
5. Mild cardiomegaly. LEFT ventricular hypertrophy suspected.
6. Non-obstructing calculi involving the upper poles of both
kidneys.

Aortic Atherosclerosis (BW656-0EL.L).

Aortic aneurysm NOS (BW656-PBB.1).

## 2021-05-04 ENCOUNTER — Other Ambulatory Visit: Payer: Self-pay

## 2021-05-04 ENCOUNTER — Ambulatory Visit (INDEPENDENT_AMBULATORY_CARE_PROVIDER_SITE_OTHER): Payer: Medicare HMO | Admitting: Nurse Practitioner

## 2021-05-04 ENCOUNTER — Encounter: Payer: Self-pay | Admitting: Nurse Practitioner

## 2021-05-04 VITALS — BP 136/82 | HR 75 | Temp 97.8°F | Resp 20 | Ht 70.0 in | Wt 218.0 lb

## 2021-05-04 DIAGNOSIS — M7022 Olecranon bursitis, left elbow: Secondary | ICD-10-CM | POA: Diagnosis not present

## 2021-05-04 MED ORDER — PREDNISONE 10 MG (21) PO TBPK: ORAL_TABLET | ORAL | 0 refills | Status: DC

## 2021-05-04 NOTE — Progress Notes (Signed)
   Subjective:    Patient ID: Jeffrey [name], male    DOB: 05-09-1956, 65 y.o.   MRN: [id number]  Chief Complaint: Knot on left elbow (Painful/)   HPI Patient comes in co sore knot on left elbow. Ha sbeen there for 2 days. He does not recall an injury.     Review of Systems  Musculoskeletal:  Positive for arthralgias (left elbow).  All other systems reviewed and are negative.     Objective:   Physical Exam Vitals and nursing note reviewed.  Constitutional:      Appearance: Normal appearance.  Cardiovascular:     Rate and Rhythm: Normal rate and regular rhythm.     Heart sounds: Normal heart sounds.  Pulmonary:     Effort: Pulmonary effort is normal.     Breath sounds: Normal breath sounds.  Musculoskeletal:     Comments: left elbow edematous and tender to touch  Skin:    General: Skin is warm.  Neurological:     General: No focal deficit present.     Mental Status: He is alert and oriented to person, place, and time.  Psychiatric:        Mood and Affect: Mood normal.        Behavior: Behavior normal.   BP 136/82   Pulse 75   Temp 97.8 F (36.6 C) (Temporal)   Resp 20   Ht '5\' 10"'$  (1.778 m)   Wt 218 lb (98.9 kg)   SpO2 95%   BMI 31.28 kg/m         Assessment & Plan:  Jeffrey [name] in today with chief complaint of Knot on left elbow (Painful/)   1. Olecranon bursitis of left elbow Rest Ice Compression sleeve Meds ordered this encounter  Medications   predniSONE (STERAPRED UNI-PAK 21 TAB) 10 MG (21) TBPK tablet    Sig: As directed x 6 days    Dispense:  21 tablet    Refill:  0    Order Specific Question:   Supervising Provider    Answer:   Caryl Pina A [id number]       The above assessment and management plan was discussed with the patient. The patient verbalized understanding of and has agreed to the management plan. Patient is aware to call the clinic if symptoms persist or worsen. Patient is aware when to return to the clinic for a follow-up  visit. Patient educated on when it is appropriate to go to the emergency department.   Mary-Margaret Hassell Done, FNP

## 2021-05-04 NOTE — Patient Instructions (Signed)
Elbow Bursitis Rehab Ask your health care provider which exercises are safe for you. Do exercises exactly as told by your health care provider and adjust them as directed. It is normal to feel mild stretching, pulling, tightness, or discomfort as you do these exercises. Stop right away if you feel sudden pain or your pain gets worse. Do not begin these exercises until told by your health care provider. Stretching and range-of-motion exercises These exercises warm up your muscles and joints and improve the movement andflexibility of your elbow. The exercises also help to relieve pain and swelling. Elbow flexion, assisted Stand or sit with your left / right arm at your side. Use your other hand to gently push your left / right hand toward your shoulder (assisted) while bending your elbow (flexion). Hold this position for __________ seconds. Slowly return your left / right arm to the starting position. Repeat __________ times. Complete this exercise __________ times a day. Elbow extension, assisted Lie on your back in a comfortable position that allows you to relax your arm muscles. Place a folded towel under your left / right upper arm so that your elbow and shoulder are at the same height. Use your other arm to raise your left / right arm (assisted) until your elbow and hand do not rest on the bed or towel. Hold your left / right arm out straight with your other hand supporting it. Let the weight of your hand straighten your elbow (extension). You should feel a stretch on the inside of your elbow. Keep your arm and chest muscles relaxed. If directed, add a small wrist weight or hand weight to increase the stretch. Hold this position for __________ seconds. Slowly release the stretch and return to the starting position. Repeat __________ times. Complete this exercise __________ times a day. Elbow flexion, active Stand or sit with your left / right elbow bent and your palm facing in, toward your  body. Bend your elbow as far as you can using only your arm muscles (active flexion). Hold this position for __________ seconds. Slowly return to the starting position. Repeat __________ times. Complete this exercise __________ times a day. Elbow extension, active Stand or sit with your left / right elbow bent and your palm facing in, toward your body. Slowly straighten your elbow using only your arm muscles (active extension). Stop when you feel a gentle stretch at the front of your arm, or when your arm is straight. Hold this position for __________ seconds. Slowly return to the starting position. Repeat __________ times. Complete this exercise __________ times a day. Strengthening exercises These exercises build strength and endurance in your elbow. Endurance is theability to use your muscles for a long time, even after they get tired. Elbow flexion, isometric Stand or sit with your left / right arm at waist height. Your palm should face in, toward your body. Place your other hand on top of your left / right forearm. Gently push down while you resist with your left / right arm (isometric flexion). Use about 50% effort with both arms. You may be instructed to use more and more effort with your arms each week. Try not to let your left / right arm move during the exercise. Hold this position for __________ seconds. Let your muscles relax completely before you repeat the exercise. Repeat __________ times. Complete this exercise __________ times a day. Elbow extension, isometric  Stand or sit with your left / right arm at waist height. Your palm should face in,  toward your body. Place your other hand on the bottom of your left / right forearm. Gently push up while you resist with your left / right arm (isometric extension). Use about 50% effort with both arms. You may be instructed to use more and more effort with your arms each week. Try not to let your left / right arm move during the  exercise. Hold this position for __________ seconds. Let your muscles relax completely before you repeat the exercise. Repeat __________ times. Complete this exercise __________ times a day. Biceps curls Sit on a stable chair without armrests, or stand up. Hold a _________ weight in your left / right hand. Your palm should face out, away from your body, at the starting position. Bend your left / right elbow and move your hand up toward your shoulder. Keep your elbow at your side while you bend it. Slowly return to the starting position. Repeat __________ times. Complete this exercise __________ times a day. Triceps curls  Lie on your back. Hold a _________ weight in your left / right hand. Bend your left / right elbow to a 90-degree angle (right angle), so the weight is in front of your face, over your chest, and your elbow is pointed up to the ceiling. Straighten your elbow, raising your hand toward the ceiling. Use your other hand to support your left / right upper arm and to keep it still. Slowly return to the starting position. Repeat __________ times. Complete this exercise __________ times a day. This information is not intended to replace advice given to you by your health care provider. Make sure you discuss any questions you have with your healthcare provider. Document Revised: 12/31/2018 Document Reviewed: 09/30/2018 Elsevier Patient Education  [place].

## 2021-06-25 ENCOUNTER — Other Ambulatory Visit: Payer: Self-pay

## 2021-06-25 ENCOUNTER — Ambulatory Visit (INDEPENDENT_AMBULATORY_CARE_PROVIDER_SITE_OTHER): Payer: Medicare HMO | Admitting: Nurse Practitioner

## 2021-06-25 ENCOUNTER — Encounter: Payer: Self-pay | Admitting: Nurse Practitioner

## 2021-06-25 VITALS — BP 130/88 | HR 95 | Temp 97.9°F | Resp 20 | Ht 70.0 in | Wt 215.0 lb

## 2021-06-25 DIAGNOSIS — M7022 Olecranon bursitis, left elbow: Secondary | ICD-10-CM | POA: Diagnosis not present

## 2021-06-25 MED ORDER — PREDNISONE 10 MG (21) PO TBPK: ORAL_TABLET | ORAL | 0 refills | Status: DC

## 2021-06-25 NOTE — Progress Notes (Signed)
   Subjective:    Patient ID: Jeffrey [name], male    DOB: Feb 15, 1956, 65 y.o.   MRN: [id number]   Chief Complaint: Fluid in left elbow   HPI Patient comes in today with left elbow pain and swelling. He was seen 2 months ago with same thing. We gave him antibitoics and steroids last time and that worked well.     Review of Systems  Constitutional:  Negative for diaphoresis.  Eyes:  Negative for pain.  Respiratory:  Negative for shortness of breath.   Cardiovascular:  Negative for chest pain, palpitations and leg swelling.  Gastrointestinal:  Negative for abdominal pain.  Endocrine: Negative for polydipsia.  Skin:  Negative for rash.  Neurological:  Negative for dizziness, weakness and headaches.  Hematological:  Does not bruise/bleed easily.  All other systems reviewed and are negative.     Objective:   Physical Exam Vitals and nursing note reviewed.  Constitutional:      Appearance: Normal appearance. He is well-developed.  Neck:     Thyroid: No thyroid mass or thyromegaly.     Vascular: No carotid bruit or JVD.     Trachea: Phonation normal.  Cardiovascular:     Rate and Rhythm: Normal rate and regular rhythm.  Pulmonary:     Effort: Pulmonary effort is normal. No respiratory distress.     Breath sounds: Normal breath sounds.  Abdominal:     General: Bowel sounds are normal.     Palpations: Abdomen is soft.     Tenderness: There is no abdominal tenderness.  Musculoskeletal:        General: Normal range of motion.     Comments: Ft elbow erythematous and edematous. No pain with movement or palpation.  Lymphadenopathy:     Cervical: No cervical adenopathy.  Skin:    General: Skin is warm and dry.  Neurological:     Mental Status: He is alert and oriented to person, place, and time.  Psychiatric:        Behavior: Behavior normal.        Thought Content: Thought content normal.        Judgment: Judgment normal.   BP 130/88   Pulse 95   Temp 97.9 F (36.6 C)  (Temporal)   Resp 20   Ht 5\' 10"  (1.778 m)   Wt 215 lb (97.5 kg)   SpO2 95%   BMI 30.85 kg/m         Assessment & Plan:  Jeffrey [name] in today with chief complaint of Fluid in left elbow   1. Olecranon bursitis of left elbow Ice BID Wrap if helps RTO prn  Meds ordered this encounter  Medications   predniSONE (STERAPRED UNI-PAK 21 TAB) 10 MG (21) TBPK tablet    Sig: As directed x 6 days    Dispense:  21 tablet    Refill:  0    Order Specific Question:   Supervising Provider    Answer:   Caryl Pina A [[id number]]       The above assessment and management plan was discussed with the patient. The patient verbalized understanding of and has agreed to the management plan. Patient is aware to call the clinic if symptoms persist or worsen. Patient is aware when to return to the clinic for a follow-up visit. Patient educated on when it is appropriate to go to the emergency department.   Mary-Margaret Hassell Done, FNP

## 2021-06-25 NOTE — Patient Instructions (Signed)
Bursitis Bursitis is inflammation and irritation of a bursa, which is one of the small, fluid-filled sacs that cushion and protect the moving parts of your body. These sacs are located between bones and muscles, bones and muscle attachments, or bones and skin areas that are next to bones. A bursa protects those structures from the wear and tear that results from frequent movement. An inflamed bursa causes pain and swelling. Fluid may build up inside the sac. Bursitis is most common near joints, especially the knees, elbows, hips, and shoulders. What are the causes? This condition may be caused by: Injury from: A direct hit (blow), like falling on your knee or elbow. Overuse of a joint (repetitive stress). Infection. This can happen if bacteria get into a bursa through a cut or scrape near a joint. Diseases that cause joint inflammation, such as gout and rheumatoid arthritis. What increases the risk? You are more likely to develop this condition if you: Have a job or hobby that involves a lot of repetitive stress on your joints. Have a condition that weakens your body's defense system (immune system), such as diabetes, cancer, or HIV. Do any of these often: Lift and reach overhead. Kneel or lean on hard surfaces. Run or walk. What are the signs or symptoms? The most common symptoms of this condition include: Pain that gets worse when you move the affected body part or use it to support (bear) your body weight. Inflammation. Stiffness. Other symptoms include: Redness. Swelling. Tenderness. Warmth. Pain that continues after rest. Fever or chills. These may occur in bursitis that is caused by infection. How is this diagnosed? This condition may be diagnosed based on: Your medical history and a physical exam. Imaging tests, such as an MRI. A procedure to drain fluid from the bursa with a needle (aspiration). The fluid may be checked for signs of infection or gout. Blood tests to rule  out other causes of inflammation. How is this treated? This condition can usually be treated at home with rest, ice, applying pressure (compression), and raising the body part that is affected (elevation). This is called RICE therapy. For mild bursitis, RICE therapy may be all you need. Other treatments may include: NSAIDs to treat pain and inflammation. Corticosteroid medicines to fight inflammation. These medicines may be injected into and around the area of bursitis. Aspiration of fluid from the bursa to relieve pain and improve movement. Antibiotic medicine to treat an infected bursa. A splint, brace, or walking aid, such as a cane. Physical therapy if you continue to have pain or limited movement. Surgery to remove a damaged or infected bursa. This may be needed if other treatments have not worked. Follow these instructions at home: Medicines Take over-the-counter and prescription medicines only as told by your health care provider. If you were prescribed an antibiotic medicine, take it as told by your health care provider. Do not stop taking the antibiotic even if you start to feel better. General instructions  Rest the affected area as told by your health care provider. If possible, raise (elevate) the affected area above the level of your heart while you are sitting or lying down. Avoid activities that make pain worse. Use splints, braces, pads, or walking aids as told by your health care provider. If directed, put ice on the affected area: If you have a removable splint or brace, remove it as told by your health care provider. Put ice in a plastic bag. Place a towel between your skin and the bag  or between your splint or brace and the bag. Leave the ice on for 20 minutes, 2-3 times a day. Keep all follow-up visits as told by your health care provider. This is important. Preventing future episodes Take actions to help prevent future episodes of bursitis. Wear knee pads if you  kneel often. Wear sturdy running or walking shoes that fit you well. Take breaks regularly from repetitive activity. Warm up by stretching before doing any activity that takes a lot of effort. Maintain a healthy weight or lose weight as recommended by your health care provider. If you need help doing this, ask your health care provider. Exercise regularly. Start any new physical activity gradually. Contact a health care provider if you: Have a fever. Have chills. Have bursitis that is not getting better with treatment or home care. Summary Bursitis is inflammation and irritation of a bursa, which is one of the small, fluid-filled sacs that cushion and protect the moving parts of your body. An inflamed bursa causes pain and swelling. Bursitis is commonly diagnosed with a physical exam, but other tests are sometimes needed. This condition can usually be treated at home with rest, ice, applying pressure (compression), and raising the body part that is affected (elevation). This is called RICE therapy. This information is not intended to replace advice given to you by your health care provider. Make sure you discuss any questions you have with your health care provider. Document Revised: 03/15/2020 Document Reviewed: 03/15/2020 Elsevier Patient Education  [place].

## 2021-08-17 DIAGNOSIS — U071 COVID-19: Secondary | ICD-10-CM | POA: Diagnosis not present

## 2021-08-25 DIAGNOSIS — M25422 Effusion, left elbow: Secondary | ICD-10-CM | POA: Diagnosis not present

## 2021-08-27 ENCOUNTER — Encounter: Payer: Self-pay | Admitting: Family Medicine

## 2021-08-27 ENCOUNTER — Ambulatory Visit (INDEPENDENT_AMBULATORY_CARE_PROVIDER_SITE_OTHER): Payer: Medicare HMO | Admitting: Family Medicine

## 2021-08-27 VITALS — BP 133/79 | HR 88 | Temp 98.3°F | Ht 70.0 in | Wt 216.2 lb

## 2021-08-27 DIAGNOSIS — Z23 Encounter for immunization: Secondary | ICD-10-CM | POA: Diagnosis not present

## 2021-08-27 DIAGNOSIS — M7022 Olecranon bursitis, left elbow: Secondary | ICD-10-CM

## 2021-08-27 DIAGNOSIS — Z0100 Encounter for examination of eyes and vision without abnormal findings: Secondary | ICD-10-CM | POA: Diagnosis not present

## 2021-08-27 NOTE — Progress Notes (Signed)
Chief Complaint  Patient presents with   Bursitis    Left elbow    HPI  Patient presents today for recurrent swelling, redness and pain at left olecranonsurface of the elbow. Third occurrence since October. None prior. Concerned for Gout. Went to urgent care 2 days ago. Given Keflex and 12 day prednisone taper. Now redness is gone and the pain is mild. Swelling still present.   PMH: Smoking status noted ROS: Per HPI  Objective: BP 133/79   Pulse 88   Temp 98.3 F (36.8 C)   Ht '5\' 10"'  (1.778 m)   Wt 216 lb 3.2 oz (98.1 kg)   SpO2 94%   BMI 31.02 kg/m  Gen: NAD, alert, cooperative with exam HEENT: NCAT, EOMI, PERRL CV: RRR, good S1/S2, no murmur Resp: CTABL, no wheezes, non-labored Ext: FROM. Left elbow has FROM. MOderate edema at the olecranon. Nontender Neuro: Alert and oriented, No gross deficits  Assessment and plan:  1. Olecranon bursitis of left elbow     SInce there is notable improvement, continue abx and prednisone as is.    Orders Placed This Encounter  Procedures   CBC with Differential/Platelet   CMP14+EGFR    Order Specific Question:   Has the patient fasted?    Answer:   Yes    Order Specific Question:   Release to patient    Answer:   Immediate   Uric acid     Follow up as needed.  Claretta Fraise, MD

## 2021-08-28 LAB — CBC WITH DIFFERENTIAL/PLATELET
Basophils Absolute: 0 10*3/uL (ref 0.0–0.2)
Basos: 0 %
EOS (ABSOLUTE): 0 10*3/uL (ref 0.0–0.4)
Eos: 0 %
Hematocrit: 43.3 % (ref 37.5–51.0)
Hemoglobin: 14.8 g/dL (ref 13.0–17.7)
Immature Grans (Abs): 0 10*3/uL (ref 0.0–0.1)
Immature Granulocytes: 0 %
Lymphocytes Absolute: 1.4 10*3/uL (ref 0.7–3.1)
Lymphs: 8 %
MCH: 29 pg (ref 26.6–33.0)
MCHC: 34.2 g/dL (ref 31.5–35.7)
MCV: 85 fL (ref 79–97)
Monocytes Absolute: 1 10*3/uL — ABNORMAL HIGH (ref 0.1–0.9)
Monocytes: 6 %
Neutrophils Absolute: 15.7 10*3/uL — ABNORMAL HIGH (ref 1.4–7.0)
Neutrophils: 86 %
Platelets: 386 10*3/uL (ref 150–450)
RBC: 5.11 x10E6/uL (ref 4.14–5.80)
RDW: 12 % (ref 11.6–15.4)
WBC: 18.1 10*3/uL — ABNORMAL HIGH (ref 3.4–10.8)

## 2021-08-28 LAB — CMP14+EGFR
ALT: 21 IU/L (ref 0–44)
AST: 15 IU/L (ref 0–40)
Albumin/Globulin Ratio: 2 (ref 1.2–2.2)
Albumin: 4.3 g/dL (ref 3.8–4.8)
Alkaline Phosphatase: 81 IU/L (ref 44–121)
BUN/Creatinine Ratio: 18 (ref 10–24)
BUN: 20 mg/dL (ref 8–27)
Bilirubin Total: 0.4 mg/dL (ref 0.0–1.2)
CO2: 24 mmol/L (ref 20–29)
Calcium: 9.6 mg/dL (ref 8.6–10.2)
Chloride: 105 mmol/L (ref 96–106)
Creatinine, Ser: 1.12 mg/dL (ref 0.76–1.27)
Globulin, Total: 2.2 g/dL (ref 1.5–4.5)
Glucose: 112 mg/dL — ABNORMAL HIGH (ref 70–99)
Potassium: 4.6 mmol/L (ref 3.5–5.2)
Sodium: 144 mmol/L (ref 134–144)
Total Protein: 6.5 g/dL (ref 6.0–8.5)
eGFR: 73 mL/min/{1.73_m2} (ref >59)

## 2021-08-28 LAB — URIC ACID: Uric Acid: 6.1 mg/dL (ref 3.8–8.4)

## 2021-09-10 ENCOUNTER — Telehealth: Payer: Self-pay | Admitting: Family Medicine

## 2021-09-13 DIAGNOSIS — M7022 Olecranon bursitis, left elbow: Secondary | ICD-10-CM | POA: Diagnosis not present

## 2021-09-13 DIAGNOSIS — M25522 Pain in left elbow: Secondary | ICD-10-CM | POA: Diagnosis not present

## 2021-09-21 DIAGNOSIS — M7022 Olecranon bursitis, left elbow: Secondary | ICD-10-CM | POA: Diagnosis not present

## 2021-09-27 ENCOUNTER — Ambulatory Visit (INDEPENDENT_AMBULATORY_CARE_PROVIDER_SITE_OTHER): Payer: Medicare HMO | Admitting: Family Medicine

## 2021-09-27 ENCOUNTER — Encounter: Payer: Self-pay | Admitting: Family Medicine

## 2021-09-27 VITALS — BP 123/80 | HR 80 | Temp 97.5°F | Ht 70.0 in | Wt 214.4 lb

## 2021-09-27 DIAGNOSIS — Z0000 Encounter for general adult medical examination without abnormal findings: Secondary | ICD-10-CM

## 2021-09-27 DIAGNOSIS — I10 Essential (primary) hypertension: Secondary | ICD-10-CM | POA: Diagnosis not present

## 2021-09-27 DIAGNOSIS — E669 Obesity, unspecified: Secondary | ICD-10-CM | POA: Diagnosis not present

## 2021-09-27 DIAGNOSIS — Z125 Encounter for screening for malignant neoplasm of prostate: Secondary | ICD-10-CM | POA: Diagnosis not present

## 2021-09-27 DIAGNOSIS — Z0001 Encounter for general adult medical examination with abnormal findings: Secondary | ICD-10-CM | POA: Diagnosis not present

## 2021-09-27 DIAGNOSIS — E559 Vitamin D deficiency, unspecified: Secondary | ICD-10-CM

## 2021-09-27 DIAGNOSIS — M5432 Sciatica, left side: Secondary | ICD-10-CM | POA: Diagnosis not present

## 2021-09-27 LAB — URINALYSIS
Bilirubin, UA: NEGATIVE
Glucose, UA: NEGATIVE
Ketones, UA: NEGATIVE
Leukocytes,UA: NEGATIVE
Nitrite, UA: NEGATIVE
Protein,UA: NEGATIVE
RBC, UA: NEGATIVE
Specific Gravity, UA: >1.03 — ABNORMAL HIGH (ref 1.005–1.030)
Urobilinogen, Ur: 0.2 mg/dL (ref 0.2–1.0)
pH, UA: 5 (ref 5.0–7.5)

## 2021-09-27 MED ORDER — AMLODIPINE BESYLATE 5 MG PO TABS: 5.0000 mg | ORAL_TABLET | Freq: Every day | ORAL | 3 refills | Status: DC

## 2021-09-27 MED ORDER — ESOMEPRAZOLE MAGNESIUM 40 MG PO CPDR: 40.0000 mg | DELAYED_RELEASE_CAPSULE | Freq: Every day | ORAL | 3 refills | Status: DC
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
## 2021-09-28 LAB — CBC WITH DIFFERENTIAL/PLATELET
Basophils Absolute: 0.1 10*3/uL (ref 0.0–0.2)
Basos: 1 %
EOS (ABSOLUTE): 0.3 10*3/uL (ref 0.0–0.4)
Eos: 3 %
Hematocrit: 44.8 % (ref 37.5–51.0)
Hemoglobin: 15.9 g/dL (ref 13.0–17.7)
Immature Grans (Abs): 0.1 10*3/uL (ref 0.0–0.1)
Immature Granulocytes: 1 %
Lymphocytes Absolute: 1.9 10*3/uL (ref 0.7–3.1)
Lymphs: 21 %
MCH: 29.8 pg (ref 26.6–33.0)
MCHC: 35.5 g/dL (ref 31.5–35.7)
MCV: 84 fL (ref 79–97)
Monocytes Absolute: 0.7 10*3/uL (ref 0.1–0.9)
Monocytes: 8 %
Neutrophils Absolute: 6 10*3/uL (ref 1.4–7.0)
Neutrophils: 66 %
Platelets: 277 10*3/uL (ref 150–450)
RBC: 5.34 x10E6/uL (ref 4.14–5.80)
RDW: 12.9 % (ref 11.6–15.4)
WBC: 9 10*3/uL (ref 3.4–10.8)

## 2021-09-28 LAB — CMP14+EGFR
ALT: 27 IU/L (ref 0–44)
AST: 21 IU/L (ref 0–40)
Albumin/Globulin Ratio: 2.2 (ref 1.2–2.2)
Albumin: 4.6 g/dL (ref 3.8–4.8)
Alkaline Phosphatase: 81 IU/L (ref 44–121)
BUN/Creatinine Ratio: 12 (ref 10–24)
BUN: 20 mg/dL (ref 8–27)
Bilirubin Total: 0.6 mg/dL (ref 0.0–1.2)
CO2: 22 mmol/L (ref 20–29)
Calcium: 9.9 mg/dL (ref 8.6–10.2)
Chloride: 103 mmol/L (ref 96–106)
Creatinine, Ser: 1.67 mg/dL — ABNORMAL HIGH (ref 0.76–1.27)
Globulin, Total: 2.1 g/dL (ref 1.5–4.5)
Glucose: 99 mg/dL (ref 70–99)
Potassium: 4.3 mmol/L (ref 3.5–5.2)
Sodium: 142 mmol/L (ref 134–144)
Total Protein: 6.7 g/dL (ref 6.0–8.5)
eGFR: 45 mL/min/{1.73_m2} — ABNORMAL LOW (ref >59)

## 2021-09-28 LAB — LIPID PANEL
Chol/HDL Ratio: 4.7 ratio (ref 0.0–5.0)
Cholesterol, Total: 183 mg/dL (ref 100–199)
HDL: 39 mg/dL — ABNORMAL LOW (ref >39)
LDL Chol Calc (NIH): 125 mg/dL — ABNORMAL HIGH (ref 0–99)
Triglycerides: 106 mg/dL (ref 0–149)
VLDL Cholesterol Cal: 19 mg/dL (ref 5–40)

## 2021-09-28 LAB — PSA, TOTAL AND FREE
PSA, Free Pct: 48.3 %
PSA, Free: 0.58 ng/mL
Prostate Specific Ag, Serum: 1.2 ng/mL (ref 0.0–4.0)

## 2021-09-28 LAB — VITAMIN D 25 HYDROXY (VIT D DEFICIENCY, FRACTURES): Vit D, 25-Hydroxy: 21.2 ng/mL — ABNORMAL LOW (ref 30.0–100.0)

## 2021-10-01 ENCOUNTER — Telehealth: Payer: Self-pay | Admitting: *Deleted

## 2021-10-01 NOTE — Telephone Encounter (Signed)
Okay. Need 30 min appt.

## 2021-10-01 NOTE — Telephone Encounter (Signed)
SPOKE WITH PATIENT, HE WILL CALL ME BACK TO SCHEUDLE

## 2021-10-01 NOTE — Telephone Encounter (Signed)
Will Dr Livia Snellen aspirate an elbow bursa? He is not ready to have surgically removed yet.

## 2021-10-03 ENCOUNTER — Ambulatory Visit: Payer: Medicare HMO | Admitting: Family Medicine

## 2021-10-03 DIAGNOSIS — M7022 Olecranon bursitis, left elbow: Secondary | ICD-10-CM | POA: Diagnosis not present

## 2021-10-12 ENCOUNTER — Telehealth: Payer: Self-pay | Admitting: Family Medicine

## 2021-10-12 DIAGNOSIS — I10 Essential (primary) hypertension: Secondary | ICD-10-CM

## 2021-10-12 MED ORDER — ESOMEPRAZOLE MAGNESIUM 40 MG PO CPDR: 40.0000 mg | DELAYED_RELEASE_CAPSULE | Freq: Every day | ORAL | 3 refills | Status: DC

## 2021-10-12 MED ORDER — AMLODIPINE BESYLATE 5 MG PO TABS: 5.0000 mg | ORAL_TABLET | Freq: Every day | ORAL | 3 refills | Status: DC

## 2021-10-12 NOTE — Telephone Encounter (Signed)
Rx sent to [facility] per patients request. Left detailed message on patients voicemail that this has been done

## 2021-10-16 ENCOUNTER — Encounter (HOSPITAL_BASED_OUTPATIENT_CLINIC_OR_DEPARTMENT_OTHER): Payer: Self-pay | Admitting: Orthopedic Surgery

## 2021-10-16 ENCOUNTER — Other Ambulatory Visit: Payer: Self-pay

## 2021-10-16 NOTE — Progress Notes (Signed)
Spoke w/ via phone for pre-op interview---patient Lab needs dos--- EKG Lab results------ CMP and CBC 09/27/21 COVID test -----patient states asymptomatic no test needed. Arrive at -------0830am 10/22/21 NPO after MN NO Solid Food.  Clear liquids from MN until---07:30am Med rec completed. Medications to take morning of surgery -----Amlodipine and Esomeprazole. Diabetic medication -----NA Patient instructed no nail polish to be worn day of surgery. Patient instructed to bring photo id and insurance card day of surgery. Patient aware to have Driver (ride ) / caregiver    for 24 hours after surgery . Patient Special Instructions -----NA Pre-Op special Istructions -----NA Patient verbalized understanding of instructions that were given at this phone interview. Patient denies shortness of breath, chest pain, fever, cough at this phone interview.
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
## 2021-10-22 ENCOUNTER — Ambulatory Visit (HOSPITAL_BASED_OUTPATIENT_CLINIC_OR_DEPARTMENT_OTHER): Payer: Medicare HMO | Admitting: Anesthesiology

## 2021-10-22 ENCOUNTER — Encounter (HOSPITAL_BASED_OUTPATIENT_CLINIC_OR_DEPARTMENT_OTHER): Payer: Self-pay | Admitting: Orthopedic Surgery

## 2021-10-22 ENCOUNTER — Other Ambulatory Visit: Payer: Self-pay

## 2021-10-22 ENCOUNTER — Ambulatory Visit (HOSPITAL_BASED_OUTPATIENT_CLINIC_OR_DEPARTMENT_OTHER)
Admission: RE | Admit: 2021-10-22 | Discharge: 2021-10-22 | Disposition: A | Discharge status: Home / Self Care | Payer: Medicare HMO | Attending: Orthopedic Surgery | Admitting: Orthopedic Surgery

## 2021-10-22 ENCOUNTER — Encounter (HOSPITAL_BASED_OUTPATIENT_CLINIC_OR_DEPARTMENT_OTHER)
Admission: RE | Disposition: A | Discharge status: Home / Self Care | Payer: Self-pay | Source: Home / Self Care | Attending: Orthopedic Surgery

## 2021-10-22 DIAGNOSIS — M25722 Osteophyte, left elbow: Secondary | ICD-10-CM | POA: Diagnosis not present

## 2021-10-22 DIAGNOSIS — I10 Essential (primary) hypertension: Secondary | ICD-10-CM | POA: Insufficient documentation

## 2021-10-22 DIAGNOSIS — N289 Disorder of kidney and ureter, unspecified: Secondary | ICD-10-CM | POA: Diagnosis not present

## 2021-10-22 DIAGNOSIS — M7022 Olecranon bursitis, left elbow: Secondary | ICD-10-CM | POA: Insufficient documentation

## 2021-10-22 DIAGNOSIS — Z79899 Other long term (current) drug therapy: Secondary | ICD-10-CM | POA: Insufficient documentation

## 2021-10-22 DIAGNOSIS — M1990 Unspecified osteoarthritis, unspecified site: Secondary | ICD-10-CM | POA: Diagnosis not present

## 2021-10-22 HISTORY — DX: Syncope and collapse: R55

## 2021-10-22 HISTORY — DX: Dysphagia, unspecified: R13.10

## 2021-10-22 HISTORY — DX: Supraventricular tachycardia, unspecified: I47.10

## 2021-10-22 HISTORY — PX: OLECRANON BURSECTOMY: SHX2097

## 2021-10-22 HISTORY — DX: Supraventricular tachycardia: I47.1

## 2021-10-22 SURGERY — BURSECTOMY, ELBOW
Anesthesia: Monitor Anesthesia Care | Site: Elbow | Laterality: Left

## 2021-10-22 MED ORDER — ACETAMINOPHEN 500 MG PO TABS
ORAL_TABLET | ORAL | Status: AC
  Filled 2021-10-22: qty 2

## 2021-10-22 MED ORDER — PROPOFOL 500 MG/50ML IV EMUL
INTRAVENOUS | Status: AC
  Filled 2021-10-22: qty 50

## 2021-10-22 MED ORDER — PROPOFOL 10 MG/ML IV BOLUS
INTRAVENOUS | Status: AC
  Filled 2021-10-22: qty 20

## 2021-10-22 MED ORDER — FENTANYL CITRATE (PF) 100 MCG/2ML IJ SOLN: 25.0000 ug | INTRAMUSCULAR | Status: DC | PRN

## 2021-10-22 MED ORDER — OXYCODONE-ACETAMINOPHEN 5-325 MG PO TABS
1.0000 | ORAL_TABLET | Freq: Four times a day (QID) | ORAL | 0 refills | Status: AC | PRN
Start: 2021-10-22 — End: 2021-10-25

## 2021-10-22 MED ORDER — CEFAZOLIN SODIUM-DEXTROSE 2-4 GM/100ML-% IV SOLN
INTRAVENOUS | Status: AC
  Filled 2021-10-22: qty 100

## 2021-10-22 MED ORDER — BUPIVACAINE HCL (PF) 0.5 % IJ SOLN
INTRAMUSCULAR | Status: DC | PRN
  Administered 2021-10-22: 10 mL

## 2021-10-22 MED ORDER — FENTANYL CITRATE (PF) 100 MCG/2ML IJ SOLN
INTRAMUSCULAR | Status: DC | PRN
Start: 2021-10-22 — End: 2021-10-22
  Administered 2021-10-22: 50 ug via INTRAVENOUS

## 2021-10-22 MED ORDER — MEPERIDINE HCL 25 MG/ML IJ SOLN: 6.2500 mg | INTRAMUSCULAR | Status: DC | PRN

## 2021-10-22 MED ORDER — PROPOFOL 500 MG/50ML IV EMUL
INTRAVENOUS | Status: DC | PRN
  Administered 2021-10-22: 100 ug/kg/min via INTRAVENOUS

## 2021-10-22 MED ORDER — ACETAMINOPHEN 500 MG PO TABS
1000.0000 mg | ORAL_TABLET | Freq: Once | ORAL | Status: AC
  Administered 2021-10-22: 1000 mg via ORAL

## 2021-10-22 MED ORDER — PROMETHAZINE HCL 25 MG/ML IJ SOLN: 6.2500 mg | INTRAMUSCULAR | Status: DC | PRN

## 2021-10-22 MED ORDER — MIDAZOLAM HCL 5 MG/5ML IJ SOLN
INTRAMUSCULAR | Status: DC | PRN
  Administered 2021-10-22: 2 mg via INTRAVENOUS

## 2021-10-22 MED ORDER — OXYCODONE HCL 5 MG/5ML PO SOLN: 5.0000 mg | Freq: Once | ORAL | Status: DC | PRN

## 2021-10-22 MED ORDER — ONDANSETRON HCL 4 MG/2ML IJ SOLN
INTRAMUSCULAR | Status: DC | PRN
  Administered 2021-10-22: 4 mg via INTRAVENOUS

## 2021-10-22 MED ORDER — LIDOCAINE HCL (PF) 1 % IJ SOLN
INTRAMUSCULAR | Status: DC | PRN
  Administered 2021-10-22: 10 mL

## 2021-10-22 MED ORDER — CEFAZOLIN SODIUM-DEXTROSE 2-4 GM/100ML-% IV SOLN: 2.0000 g | INTRAVENOUS | Status: DC

## 2021-10-22 MED ORDER — OXYCODONE HCL 5 MG PO TABS: 5.0000 mg | ORAL_TABLET | Freq: Once | ORAL | Status: DC | PRN

## 2021-10-22 MED ORDER — PROPOFOL 10 MG/ML IV BOLUS
INTRAVENOUS | Status: DC | PRN
  Administered 2021-10-22: 40 mg via INTRAVENOUS

## 2021-10-22 MED ORDER — MIDAZOLAM HCL 2 MG/2ML IJ SOLN
INTRAMUSCULAR | Status: AC
  Filled 2021-10-22: qty 2

## 2021-10-22 MED ORDER — LACTATED RINGERS IV SOLN: INTRAVENOUS | Status: DC

## 2021-10-22 MED ORDER — FENTANYL CITRATE (PF) 100 MCG/2ML IJ SOLN
INTRAMUSCULAR | Status: AC
  Filled 2021-10-22: qty 2

## 2021-10-22 SURGICAL SUPPLY — 47 items
BLADE SURG 15 STRL LF DISP TIS (BLADE) ×1 IMPLANT
BLADE SURG 15 STRL SS (BLADE) ×4
BNDG CMPR 9X4 STRL LF SNTH (GAUZE/BANDAGES/DRESSINGS) ×1
BNDG ELASTIC 4X5.8 VLCR STR LF (GAUZE/BANDAGES/DRESSINGS) ×2 IMPLANT
BNDG ESMARK 4X9 LF (GAUZE/BANDAGES/DRESSINGS) ×1 IMPLANT
CORD BIPOLAR FORCEPS 12FT (ELECTRODE) IMPLANT
COVER BACK TABLE 60X90IN (DRAPES) ×2 IMPLANT
CUFF TOURN SGL QUICK 18X4 (TOURNIQUET CUFF) ×2 IMPLANT
DECANTER SPIKE VIAL GLASS SM (MISCELLANEOUS) IMPLANT
DRAPE EXTREMITY T 121X128X90 (DISPOSABLE) ×3 IMPLANT
DRAPE SHEET LG 3/4 BI-LAMINATE (DRAPES) ×2 IMPLANT
DRSG EMULSION OIL 3X3 NADH (GAUZE/BANDAGES/DRESSINGS) ×2 IMPLANT
GAUZE 4X4 16PLY ~~LOC~~+RFID DBL (SPONGE) ×2 IMPLANT
GAUZE SPONGE 4X4 12PLY STRL (GAUZE/BANDAGES/DRESSINGS) ×2 IMPLANT
GAUZE SPONGE 4X4 12PLY STRL LF (GAUZE/BANDAGES/DRESSINGS) ×1 IMPLANT
GLOVE SURG UNDER POLY LF SZ7.5 (GLOVE) ×2 IMPLANT
GOWN STRL REUS W/TWL LRG LVL3 (GOWN DISPOSABLE) ×2 IMPLANT
KIT TURNOVER CYSTO (KITS) ×2 IMPLANT
LOOP VESSEL MAXI BLUE (MISCELLANEOUS) IMPLANT
NEEDLE HYPO 22GX1.5 SAFETY (NEEDLE) ×2 IMPLANT
NS IRRIG 500ML POUR BTL (IV SOLUTION) ×2 IMPLANT
PACK BASIN DAY SURGERY FS (CUSTOM PROCEDURE TRAY) ×2 IMPLANT
PAD CAST 4YDX4 CTTN HI CHSV (CAST SUPPLIES) IMPLANT
PADDING CAST ABS 4INX4YD NS (CAST SUPPLIES) ×1
PADDING CAST ABS COTTON 4X4 ST (CAST SUPPLIES) ×1 IMPLANT
PADDING CAST COTTON 4X4 STRL (CAST SUPPLIES) ×2
SLING ARM FOAM STRAP LRG (SOFTGOODS) ×1 IMPLANT
SPLINT FAST PLASTER 5X30 (CAST SUPPLIES)
SPLINT FIBERGLASS 3X35 (CAST SUPPLIES) ×1 IMPLANT
SPLINT PLASTER CAST FAST 5X30 (CAST SUPPLIES) IMPLANT
SPLINT PLASTER CAST XFAST 3X15 (CAST SUPPLIES) IMPLANT
SPLINT PLASTER XTRA FASTSET 3X (CAST SUPPLIES)
SUCTION FRAZIER HANDLE 10FR (MISCELLANEOUS) ×2
SUCTION TUBE FRAZIER 10FR DISP (MISCELLANEOUS) IMPLANT
SUT BONE WAX W31G (SUTURE) IMPLANT
SUT ETHILON 4 0 PS 2 18 (SUTURE) ×1 IMPLANT
SUT FIBERWIRE 2-0 18 17.9 3/8 (SUTURE)
SUT PROLENE 4 0 PS 2 18 (SUTURE) IMPLANT
SUT VIC AB 3-0 FS2 27 (SUTURE) IMPLANT
SUTURE FIBERWR 2-0 18 17.9 3/8 (SUTURE) IMPLANT
SYR 10ML LL (SYRINGE) ×2 IMPLANT
SYR BULB EAR ULCER 3OZ GRN STR (SYRINGE) ×2 IMPLANT
TAPE SURG TRANSPORE 1 IN (GAUZE/BANDAGES/DRESSINGS) ×1 IMPLANT
TAPE SURGICAL TRANSPORE 1 IN (GAUZE/BANDAGES/DRESSINGS) ×2
TOWEL OR 17X26 10 PK STRL BLUE (TOWEL DISPOSABLE) ×2 IMPLANT
TUBE CONNECTING 12X1/4 (SUCTIONS) IMPLANT
UNDERPAD 30X36 HEAVY ABSORB (UNDERPADS AND DIAPERS) ×2 IMPLANT

## 2021-10-22 NOTE — Transfer of Care (Signed)
Immediate Anesthesia Transfer of Care Note  Patient: Jeffrey [name]  Procedure(s) Performed: left elbow olecranon bursa excision, ostectomy (Left: Elbow)  Patient Location: PACU  Anesthesia Type:MAC  Level of Consciousness: awake, alert , oriented and patient cooperative  Airway & Oxygen Therapy: Patient Spontanous Breathing  Post-op Assessment: Report given to RN and Post -op Vital signs reviewed and stable  Post vital signs: Reviewed and stable  Last Vitals:  Vitals Value Taken Time  BP    Temp    Pulse    Resp    SpO2      Last Pain:  Vitals:   10/22/21 0906  TempSrc: Oral  PainSc: 0-No pain      Patients Stated Pain Goal: 7 (80/99/83 3825)  Complications: No notable events documented.

## 2021-10-22 NOTE — Discharge Instructions (Signed)
°  Orthopaedic Hand Surgery Discharge Instructions  WEIGHT BEARING STATUS: Non weight bearing on operative extremity  INCISION CARE: Keep dressing over your incision clean and dry until 5 days after surgery. You may shower by placing a waterproof covering over your dressing. Once dressing is removed, you may allow water to run over the incision and then place Band-Aids over incision. Do not scrub your incision or apply creams/lotions. Do not submerge your incision or swim for 3 weeks after surgery. Contact your surgeon or primary care doctor if you develop redness or drainage from your incision.   PAIN CONTROL: First line medications for post operative pain control are Tylenol (acetaminophen) and Motrin (ibuprofen) if you are able to take these medications. If you have been prescribed a medication these can be taken as breakthrough pain medications. Please note that some narcotic pain medication has acetaminophen added and you should never consume more than 4,000mg  of acetaminophen in 24-hour period. Please note that if you are given Toradol (ketorolac) you should not take similar medications such as ibuprofen or naproxen.  DISCHARGE MEDICATIONS: If you have been prescribed medication it was sent electronically to your pharmacy. No changes have been made to your home medications.  ICE/ELEVATION: Ice and elevate your injured extremity as needed. Avoid direct contact of ice with skin.   BANDAGE FEELS TOO TIGHT: If your bandage feels too tight, first make sure you are elevating your fingers as much as possible. The outer layer of the bandage can be unwrapped and reapplied more loosely. If no improvement, you may carefully cut the inner layer longitudinally until the pressure has resolved and then rewrap the outer layer. If you are not comfortable with these instructions, please call the office and the bandage can be changed for you.   FOLLOW UP: You will be called after surgery with an appointment date and  time, however if you have not received a phone call within 3 days, please call during regular office hours at [phone] to schedule a post operative appointment.  Please Seek Medical Attention if: Call MD for: pain or pressure in chest, jaw, arm, back, neck  Call MD for: temperature greater than 101 F for more than 24 hrs Call MD for: difficulty breathing Call MD for: incision redness, bleeding, drainage  Call MD for: palpitations or feeling that the heart is racing  Call MD for: increased swelling in arm, leg, ankle, or abdomen  Call MD for: lightheadedness, dizziness, fainting Call 911 or go to ER for any medical emergency if you are not able to get in touch with your doctor   J. Sable Feil, MD Orthopaedic Hand Surgeon [facility] Office number: [phone] [address], [place], [place] [address]

## 2021-10-22 NOTE — Interval H&P Note (Signed)
History and Physical Interval Note:  10/22/2021 10:34 AM  Jeffrey [name]  has presented today for surgery, with the diagnosis of left elbow chronic olecranon bursitis.  The various methods of treatment have been discussed with the patient and family. After consideration of risks, benefits and other options for treatment, the patient has consented to  Procedure(s) with comments: left elbow olecranon bursa excision, ostectomy (Left) - with local anesthesia as a surgical intervention.  The patient's history has been reviewed, patient examined, no change in status, stable for surgery.  I have reviewed the patient's chart and labs.  Questions were answered to the patient's satisfaction.     [name]

## 2021-10-22 NOTE — Op Note (Signed)
OPERATIVE NOTE  DATE OF PROCEDURE: 10/22/21  SURGEONS:  Primary: [name], MD  PREOPERATIVE DIAGNOSIS: left elbow olecranon bursitis  POSTOPERATIVE DIAGNOSIS: Same  NAME OF PROCEDURE:   Left elbow olecranon bursa excision Left elbow olecranon bone spur ostectomy  ANESTHESIA: Monitor Anesthesia Care + Local  SKIN PREPARATION: Hibiclens  ESTIMATED BLOOD LOSS: Minimal  IMPLANTS: none  INDICATIONS:  Jeffrey [name] is a 67 y.o. male who has the above preoperative diagnosis. The patient has decided to proceed with surgical intervention.  Risks, benefits and alternatives of operative management were discussed including, but not limited to, risks of anesthesia complications, infection, pain, persistent symptoms, stiffness, need for future surgery.  The patient understands, agrees and elects to proceed with surgery.    DESCRIPTION OF PROCEDURE: The patient was met in the pre-operative area and their identity was verified.  The operative location and laterality was also verified and marked.  The patient was brought to the OR and was placed supine on the table.  After repeat patient identification with the operative team anesthesia was provided and the patient was prepped and draped in the usual sterile fashion.  A final timeout was performed verifying the correction patient, procedure, location and laterality.  Preoperative antibiotics were provided and the left upper extremity was elevated and exsanguinated with an Esmarch and tourniquet inflated to 250 mmHg.  A curvilinear incision was made over the posterior lateral aspect of the left olecranon.  Skin and subcutaneous tissues were divided and careful hemostasis was obtained.  The olecranon bursa was thick and inflamed with chronic scarring.  This was identified and excised completely.  There was significant amount of bursal fluid and thickening of the bursa.  This was excised in its entirety.  Bipolar electrocautery was utilized to obtain careful  hemostasis throughout the bursa. The olecranon bone spur was identified and using a ronguer the excess prominent bone was removed. The bone end was smoothed with a rasp to a smooth surface. The skin was approximated and was gliding smoothly over the olecranon. The wound was thoroughly irrigated with normal saline.  The skin was closed in layers with 4-0 interrupted buried Vicryl sutures followed by 4-0 horizontal mattress nylon sutures.  A sterile soft bulky bandage was applied with the elbow in full extension.  The tourniquet was deflated and the fingers were pink and warm and well-perfused at the end of the case.  The patient tolerated the procedure well.  All counts were correct x2.  The patient was awoken from anesthesia and brought to PACU for recovery in stable condition.   Matt Holmes, MD

## 2021-10-22 NOTE — Anesthesia Preprocedure Evaluation (Addendum)
Anesthesia Evaluation  Patient identified by MRN, date of birth, ID band Patient awake    Reviewed: Allergy & Precautions, NPO status , Patient's Chart, lab work & pertinent test results  Airway Mallampati: II  TM Distance: >3 FB Neck ROM: Full    Dental  (+) Dental Advisory Given, Teeth Intact   Pulmonary    Pulmonary exam normal breath sounds clear to auscultation       Cardiovascular hypertension, Pt. on medications Normal cardiovascular exam Rhythm:Regular Rate:Normal     Neuro/Psych    GI/Hepatic   Endo/Other    Renal/GU Renal disease     Musculoskeletal  (+) Arthritis ,   Abdominal (+) + obese,   Peds  Hematology   Anesthesia Other Findings   Reproductive/Obstetrics                            Anesthesia Physical  Anesthesia Plan  ASA: 2  Anesthesia Plan: MAC   Post-op Pain Management: Minimal or no pain anticipated and Tylenol PO (pre-op)   Induction: Intravenous  PONV Risk Score and Plan: 1 and Treatment may vary due to age or medical condition, Propofol infusion, Midazolam and TIVA  Airway Management Planned: Natural Airway  Additional Equipment:   Intra-op Plan:   Post-operative Plan:   Informed Consent: I have reviewed the patients History and Physical, chart, labs and discussed the procedure including the risks, benefits and alternatives for the proposed anesthesia with the patient or authorized representative who has indicated his/her understanding and acceptance.     Dental advisory given  Plan Discussed with: CRNA  Anesthesia Plan Comments:       Anesthesia Quick Evaluation

## 2021-10-23 ENCOUNTER — Encounter (HOSPITAL_BASED_OUTPATIENT_CLINIC_OR_DEPARTMENT_OTHER): Payer: Self-pay | Admitting: Orthopedic Surgery

## 2021-10-23 NOTE — Anesthesia Postprocedure Evaluation (Signed)
Anesthesia Post Note  Patient: Jeffrey [name]  Procedure(s) Performed: left elbow olecranon bursa excision, ostectomy (Left: Elbow)     Patient location during evaluation: PACU Anesthesia Type: MAC Level of consciousness: awake and alert Pain management: pain level controlled Vital Signs Assessment: post-procedure vital signs reviewed and stable Respiratory status: spontaneous breathing Cardiovascular status: stable Anesthetic complications: no   No notable events documented.  Last Vitals:  Vitals:   10/22/21 1246 10/22/21 1300  BP: 120/88 127/88  Pulse: 79 80  Resp: 16 16  Temp: (!) 36.4 C 36.6 C  SpO2: 97% 99%    Last Pain:  Vitals:   10/22/21 1300  TempSrc:   PainSc: 0-No pain                 Nolon Nations

## 2021-11-01 DIAGNOSIS — H02885 Meibomian gland dysfunction left lower eyelid: Secondary | ICD-10-CM | POA: Diagnosis not present

## 2021-11-01 DIAGNOSIS — I358 Other nonrheumatic aortic valve disorders: Secondary | ICD-10-CM | POA: Diagnosis not present

## 2021-11-01 DIAGNOSIS — H59811 Chorioretinal scars after surgery for detachment, right eye: Secondary | ICD-10-CM | POA: Diagnosis not present

## 2021-11-01 DIAGNOSIS — Z961 Presence of intraocular lens: Secondary | ICD-10-CM | POA: Diagnosis not present

## 2021-11-01 DIAGNOSIS — H0288A Meibomian gland dysfunction right eye, upper and lower eyelids: Secondary | ICD-10-CM | POA: Diagnosis not present

## 2021-11-01 DIAGNOSIS — I517 Cardiomegaly: Secondary | ICD-10-CM | POA: Diagnosis not present

## 2021-11-12 DIAGNOSIS — M1712 Unilateral primary osteoarthritis, left knee: Secondary | ICD-10-CM | POA: Diagnosis not present

## 2021-11-12 DIAGNOSIS — M25562 Pain in left knee: Secondary | ICD-10-CM | POA: Diagnosis not present

## 2022-01-09 ENCOUNTER — Telehealth: Payer: Self-pay | Admitting: Family Medicine

## 2022-01-09 NOTE — Telephone Encounter (Signed)
?  Left message for patient to call back and schedule Medicare Annual Wellness Visit (AWV) to be completed by video or phone. ? ?No hx of AWV eligible for AWVI per [facility] as of 11/21/2021 ? ?Please schedule at anytime with [place] --- Karle Starch ? ?51 Minutes appointment  ? ?Any questions, please call me at [phone]   ?

## 2022-03-05 ENCOUNTER — Ambulatory Visit: Payer: Medicare HMO | Admitting: Family Medicine

## 2022-03-06 ENCOUNTER — Encounter: Payer: Self-pay | Admitting: Family Medicine

## 2022-03-06 ENCOUNTER — Ambulatory Visit (INDEPENDENT_AMBULATORY_CARE_PROVIDER_SITE_OTHER): Payer: Medicare HMO | Admitting: Family Medicine

## 2022-03-06 VITALS — BP 118/77 | HR 88 | Temp 98.8°F | Ht 70.0 in | Wt 215.0 lb

## 2022-03-06 DIAGNOSIS — J0140 Acute pansinusitis, unspecified: Secondary | ICD-10-CM | POA: Diagnosis not present

## 2022-03-06 MED ORDER — AMOXICILLIN-POT CLAVULANATE 875-125 MG PO TABS: 1.0000 | ORAL_TABLET | Freq: Two times a day (BID) | ORAL | 0 refills | Status: AC

## 2022-03-06 MED ORDER — GUAIFENESIN ER 600 MG PO TB12: 600.0000 mg | ORAL_TABLET | Freq: Two times a day (BID) | ORAL | 0 refills | Status: AC

## 2022-03-06 MED ORDER — FLUTICASONE PROPIONATE 50 MCG/ACT NA SUSP: 2.0000 | Freq: Every day | NASAL | 6 refills | Status: DC
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
## 2022-03-28 DIAGNOSIS — M25562 Pain in left knee: Secondary | ICD-10-CM | POA: Diagnosis not present

## 2022-04-05 DIAGNOSIS — R6889 Other general symptoms and signs: Secondary | ICD-10-CM | POA: Diagnosis not present

## 2022-04-05 DIAGNOSIS — M1712 Unilateral primary osteoarthritis, left knee: Secondary | ICD-10-CM | POA: Diagnosis not present

## 2022-04-05 DIAGNOSIS — Z4789 Encounter for other orthopedic aftercare: Secondary | ICD-10-CM | POA: Diagnosis not present

## 2022-05-07 DIAGNOSIS — Z96652 Presence of left artificial knee joint: Secondary | ICD-10-CM | POA: Diagnosis not present

## 2022-05-07 DIAGNOSIS — G8918 Other acute postprocedural pain: Secondary | ICD-10-CM | POA: Diagnosis not present

## 2022-05-07 DIAGNOSIS — M1712 Unilateral primary osteoarthritis, left knee: Secondary | ICD-10-CM | POA: Diagnosis not present

## 2022-05-09 ENCOUNTER — Ambulatory Visit: Payer: Medicare HMO | Attending: Specialist

## 2022-05-09 DIAGNOSIS — M25562 Pain in left knee: Secondary | ICD-10-CM | POA: Diagnosis not present

## 2022-05-09 DIAGNOSIS — M25662 Stiffness of left knee, not elsewhere classified: Secondary | ICD-10-CM | POA: Insufficient documentation
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
## 2022-05-10 ENCOUNTER — Other Ambulatory Visit: Payer: Self-pay

## 2022-05-13 ENCOUNTER — Ambulatory Visit: Payer: Medicare HMO

## 2022-05-13 DIAGNOSIS — M25662 Stiffness of left knee, not elsewhere classified: Secondary | ICD-10-CM

## 2022-05-13 DIAGNOSIS — M25562 Pain in left knee: Secondary | ICD-10-CM
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
## 2022-05-15 ENCOUNTER — Ambulatory Visit: Payer: Medicare HMO | Admitting: Physical Therapy

## 2022-05-15 ENCOUNTER — Encounter: Payer: Self-pay | Admitting: Physical Therapy

## 2022-05-15 DIAGNOSIS — M25562 Pain in left knee: Secondary | ICD-10-CM | POA: Diagnosis not present

## 2022-05-15 DIAGNOSIS — M25662 Stiffness of left knee, not elsewhere classified: Secondary | ICD-10-CM | POA: Diagnosis not present
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
## 2022-05-16 ENCOUNTER — Encounter: Payer: Self-pay | Admitting: *Deleted

## 2022-05-16 ENCOUNTER — Ambulatory Visit: Payer: Medicare HMO | Admitting: *Deleted

## 2022-05-16 DIAGNOSIS — M25562 Pain in left knee: Secondary | ICD-10-CM

## 2022-05-16 DIAGNOSIS — M25662 Stiffness of left knee, not elsewhere classified: Secondary | ICD-10-CM | POA: Diagnosis not present
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
## 2022-05-18 DIAGNOSIS — M1712 Unilateral primary osteoarthritis, left knee: Secondary | ICD-10-CM | POA: Diagnosis not present

## 2022-05-18 DIAGNOSIS — Z96652 Presence of left artificial knee joint: Secondary | ICD-10-CM | POA: Diagnosis not present

## 2022-05-20 ENCOUNTER — Ambulatory Visit: Payer: Medicare HMO | Admitting: Physical Therapy

## 2022-05-20 ENCOUNTER — Encounter: Payer: Self-pay | Admitting: Physical Therapy

## 2022-05-20 DIAGNOSIS — M25562 Pain in left knee: Secondary | ICD-10-CM

## 2022-05-20 DIAGNOSIS — M25662 Stiffness of left knee, not elsewhere classified: Secondary | ICD-10-CM
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
## 2022-05-21 ENCOUNTER — Ambulatory Visit: Payer: Medicare HMO

## 2022-05-21 DIAGNOSIS — M25662 Stiffness of left knee, not elsewhere classified: Secondary | ICD-10-CM

## 2022-05-21 DIAGNOSIS — M25562 Pain in left knee: Secondary | ICD-10-CM
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
## 2022-05-22 DIAGNOSIS — Z4789 Encounter for other orthopedic aftercare: Secondary | ICD-10-CM | POA: Diagnosis not present

## 2022-05-23 ENCOUNTER — Ambulatory Visit: Payer: Medicare HMO

## 2022-05-23 DIAGNOSIS — M25562 Pain in left knee: Secondary | ICD-10-CM | POA: Diagnosis not present

## 2022-05-23 DIAGNOSIS — M25662 Stiffness of left knee, not elsewhere classified: Secondary | ICD-10-CM | POA: Diagnosis not present
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
## 2022-05-24 DIAGNOSIS — M1712 Unilateral primary osteoarthritis, left knee: Secondary | ICD-10-CM | POA: Diagnosis not present

## 2022-05-24 DIAGNOSIS — Z96652 Presence of left artificial knee joint: Secondary | ICD-10-CM | POA: Diagnosis not present

## 2022-05-28 ENCOUNTER — Ambulatory Visit: Payer: Medicare HMO | Attending: Specialist | Admitting: Physical Therapy

## 2022-05-28 ENCOUNTER — Encounter: Payer: Self-pay | Admitting: Physical Therapy

## 2022-05-28 DIAGNOSIS — M25662 Stiffness of left knee, not elsewhere classified: Secondary | ICD-10-CM | POA: Diagnosis not present

## 2022-05-28 DIAGNOSIS — M25562 Pain in left knee: Secondary | ICD-10-CM | POA: Diagnosis not present
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
## 2022-05-30 ENCOUNTER — Ambulatory Visit: Payer: Medicare HMO

## 2022-05-30 DIAGNOSIS — M25662 Stiffness of left knee, not elsewhere classified: Secondary | ICD-10-CM | POA: Diagnosis not present

## 2022-05-30 DIAGNOSIS — M25562 Pain in left knee: Secondary | ICD-10-CM | POA: Diagnosis not present
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
## 2022-06-03 ENCOUNTER — Ambulatory Visit: Payer: Medicare HMO | Admitting: Physical Therapy

## 2022-06-03 ENCOUNTER — Encounter: Payer: Self-pay | Admitting: Physical Therapy

## 2022-06-03 DIAGNOSIS — M25562 Pain in left knee: Secondary | ICD-10-CM

## 2022-06-03 DIAGNOSIS — M25662 Stiffness of left knee, not elsewhere classified: Secondary | ICD-10-CM
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
## 2022-06-07 ENCOUNTER — Ambulatory Visit: Payer: Medicare HMO | Admitting: Physical Therapy

## 2022-06-07 ENCOUNTER — Encounter: Payer: Self-pay | Admitting: Physical Therapy

## 2022-06-07 DIAGNOSIS — M25662 Stiffness of left knee, not elsewhere classified: Secondary | ICD-10-CM

## 2022-06-07 DIAGNOSIS — M25562 Pain in left knee: Secondary | ICD-10-CM | POA: Diagnosis not present
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
## 2022-06-12 ENCOUNTER — Ambulatory Visit (INDEPENDENT_AMBULATORY_CARE_PROVIDER_SITE_OTHER): Payer: Medicare HMO | Admitting: Family Medicine

## 2022-06-12 ENCOUNTER — Encounter: Payer: Self-pay | Admitting: Family Medicine

## 2022-06-12 ENCOUNTER — Telehealth: Payer: Self-pay | Admitting: Family Medicine

## 2022-06-12 VITALS — BP 115/76 | HR 95 | Temp 97.8°F | Resp 20 | Ht 70.0 in | Wt 202.0 lb

## 2022-06-12 DIAGNOSIS — J069 Acute upper respiratory infection, unspecified: Secondary | ICD-10-CM

## 2022-06-12 DIAGNOSIS — R051 Acute cough: Secondary | ICD-10-CM

## 2022-06-12 MED ORDER — PSEUDOEPH-BROMPHEN-DM 30-2-10 MG/5ML PO SYRP: 5.0000 mL | ORAL_SOLUTION | Freq: Four times a day (QID) | ORAL | 0 refills | Status: DC | PRN

## 2022-06-12 NOTE — Telephone Encounter (Signed)
It has been sent!

## 2022-06-12 NOTE — Progress Notes (Signed)
Assessment & Plan:  1. Viral URI Encouraged symptom management.  Will prescribe antivirals if his COVID test comes back positive.  Education provided on viral URIs.  2. Acute cough - Novel Coronavirus, NAA ([facility]); Future   Follow up plan: Return if symptoms worsen or fail to improve.  Hendricks Limes, MSN, APRN, FNP-C [facility]  Subjective:   Patient ID: Jeffrey [name], male    DOB: 1955-11-11, 66 y.o.   MRN: [id number]  HPI: Jeffrey [name] is a 66 y.o. male presenting on 06/12/2022 for URI  URI  This is a new problem. Episode onset: 2 days ago. The problem has been gradually worsening. There has been no fever. Associated symptoms include congestion (head and chest), coughing (productive of green sputum), rhinorrhea and sneezing. Pertinent negatives include no abdominal pain, diarrhea, headaches, joint pain, nausea, sore throat, vomiting or wheezing. Associated symptoms comments: also has watery eyes. He has tried nothing for the symptoms.    ROS: Negative unless specifically indicated above in HPI.   Relevant past medical history reviewed and updated as indicated.   Allergies and medications reviewed and updated.   Current Outpatient Medications:    acetaminophen (TYLENOL) 650 MG CR tablet, Take 650 mg by mouth every 8 (eight) hours as needed for pain., Disp: , Rfl:    amLODipine (NORVASC) 5 MG tablet, Take 1 tablet (5 mg total) by mouth daily., Disp: 90 tablet, Rfl: 3   aspirin EC 81 MG tablet, Take 81 mg by mouth daily. Swallow whole., Disp: , Rfl:    diclofenac Sodium (VOLTAREN) 1 % GEL, Apply 2 g topically 2 (two) times daily as needed (pain). , Disp: , Rfl:    esomeprazole (NEXIUM) 40 MG capsule, Take 1 capsule (40 mg total) by mouth daily., Disp: 90 capsule, Rfl: 3   fluticasone (FLONASE) 50 MCG/ACT nasal spray, Place 2 sprays into both nostrils daily., Disp: 16 g, Rfl: 6  Allergies  Allergen Reactions   Sulfonamide Derivatives Rash     Objective:   BP 115/76   Pulse 95   Temp 97.8 F (36.6 C)   Resp 20   Ht '5\' 10"'$  (1.778 m)   Wt 202 lb (91.6 kg)   SpO2 97%   BMI 28.98 kg/m    Physical Exam Vitals reviewed.  Constitutional:      General: He is not in acute distress.    Appearance: Normal appearance. He is not ill-appearing, toxic-appearing or diaphoretic.  HENT:     Head: Normocephalic and atraumatic.     Right Ear: Tympanic membrane, ear canal and external ear normal. There is no impacted cerumen.     Left Ear: Tympanic membrane, ear canal and external ear normal. There is no impacted cerumen.     Nose: Nose normal.     Right Sinus: No maxillary sinus tenderness or frontal sinus tenderness.     Left Sinus: No maxillary sinus tenderness or frontal sinus tenderness.     Mouth/Throat:     Mouth: Mucous membranes are moist.     Pharynx: Oropharynx is clear. No oropharyngeal exudate or posterior oropharyngeal erythema.     Tonsils: No tonsillar exudate or tonsillar abscesses.  Eyes:     General: No scleral icterus.       Right eye: No discharge.        Left eye: No discharge.     Conjunctiva/sclera: Conjunctivae normal.  Cardiovascular:     Rate and Rhythm: Normal rate.  Pulmonary:  Effort: Pulmonary effort is normal. No respiratory distress.  Musculoskeletal:        General: Normal range of motion.     Cervical back: Normal range of motion.  Lymphadenopathy:     Cervical: No cervical adenopathy.  Skin:    General: Skin is warm and dry.  Neurological:     Mental Status: He is alert and oriented to person, place, and time. Mental status is at baseline.  Psychiatric:        Mood and Affect: Mood normal.        Behavior: Behavior normal.        Thought Content: Thought content normal.        Judgment: Judgment normal.

## 2022-06-12 NOTE — Telephone Encounter (Signed)
Called patient, no answer 

## 2022-06-13 ENCOUNTER — Telehealth: Payer: Self-pay | Admitting: Family Medicine

## 2022-06-13 LAB — NOVEL CORONAVIRUS, NAA: SARS-CoV-2, NAA: NOT DETECTED

## 2022-06-13 NOTE — Telephone Encounter (Signed)
Pt called wanting to know if Karsten Fells is going to send in an antibiotic for him for his chest congestion?  Tested neg for covid.

## 2022-06-14 ENCOUNTER — Encounter: Payer: Self-pay | Admitting: Nurse Practitioner

## 2022-06-14 ENCOUNTER — Ambulatory Visit (INDEPENDENT_AMBULATORY_CARE_PROVIDER_SITE_OTHER): Payer: Medicare HMO | Admitting: Nurse Practitioner

## 2022-06-14 VITALS — BP 132/77 | HR 96 | Temp 97.9°F | Resp 20 | Ht 70.0 in | Wt 202.0 lb

## 2022-06-14 DIAGNOSIS — J0100 Acute maxillary sinusitis, unspecified: Secondary | ICD-10-CM

## 2022-06-14 MED ORDER — LEVOFLOXACIN 750 MG PO TABS
750.0000 mg | ORAL_TABLET | Freq: Every day | ORAL | 0 refills | Status: DC
Start: 2022-06-14 — End: 2022-09-13

## 2022-06-14 NOTE — Progress Notes (Signed)
Subjective:    Patient ID: Jeffrey [name], male    DOB: [birth year], 66 y.o.   MRN: [id number]   Chief Complaint: Sinus Problem (Saw Britney on Wednesday and she wanted to do a Covid test. Test was negative)   Patient was seen by B. Blanch Media, NP on Wednesday. She was waiting for covid result to come back prior to treating. Covid was negative and he feels no better.   URI  This is a new problem. The current episode started in the past 7 days. The problem has been gradually worsening. There has been no fever. Associated symptoms include rhinorrhea and sinus pain. He has tried acetaminophen and decongestant for the symptoms. The treatment provided mild relief.    He had a total knee replacement 5 weeks ago.   Review of Systems  Constitutional:  Positive for fatigue. Negative for fever.  HENT:  Positive for rhinorrhea and sinus pain.        Objective:   Physical Exam Vitals reviewed.  Constitutional:      Appearance: Normal appearance.  HENT:     Right Ear: Tympanic membrane normal.     Left Ear: Tympanic membrane normal.     Nose: Congestion and rhinorrhea present.     Right Sinus: Maxillary sinus tenderness present. No frontal sinus tenderness.     Left Sinus: Maxillary sinus tenderness present. No frontal sinus tenderness.     Mouth/Throat:     Mouth: Mucous membranes are moist.     Pharynx: No oropharyngeal exudate or posterior oropharyngeal erythema.  Eyes:     Extraocular Movements: Extraocular movements intact.     Pupils: Pupils are equal, round, and reactive to light.  Cardiovascular:     Rate and Rhythm: Normal rate and regular rhythm.     Heart sounds: Normal heart sounds.  Pulmonary:     Effort: Pulmonary effort is normal.     Breath sounds: Normal breath sounds.  Musculoskeletal:     Cervical back: Normal range of motion and neck supple. No rigidity or tenderness.  Skin:    General: Skin is warm.  Neurological:     General: No focal deficit present.     Mental  Status: He is alert and oriented to person, place, and time.  Psychiatric:        Mood and Affect: Mood normal.        Behavior: Behavior normal.     BP 132/77   Pulse 96   Temp 97.9 F (36.6 C) (Temporal)   Resp 20   Ht '5\' 10"'$  (1.778 m)   Wt 202 lb (91.6 kg)   SpO2 96%   BMI 28.98 kg/m        Assessment & Plan:   Jeffrey [name] in today with chief complaint of Sinus Problem (Saw Britney on Wednesday and she wanted to do a Covid test. Test was negative)   1. Acute non-recurrent maxillary sinusitis 1. Take meds as prescribed 2. Use a cool mist humidifier especially during the winter months and when heat has been humid. 3. Use saline nose sprays frequently 4. Saline irrigations of the nose can be very helpful if done frequently.  * 4X daily for 1 week*  * Use of a nettie pot can be helpful with this. Follow directions with this* 5. Drink plenty of fluids 6. Keep thermostat turn down low 7.For any cough or congestion- mucinex 8. For fever or aces or pains- take tylenol or ibuprofen appropriate for age and weight.  *  for fevers greater than 101 orally you may alternate ibuprofen and tylenol every  3 hours.    - levofloxacin (LEVAQUIN) 750 MG tablet; Take 1 tablet (750 mg total) by mouth daily.  Dispense: 7 tablet; Refill: 0    The above assessment and management plan was discussed with the patient. The patient verbalized understanding of and has agreed to the management plan. Patient is aware to call the clinic if symptoms persist or worsen. Patient is aware when to return to the clinic for a follow-up visit. Patient educated on when it is appropriate to go to the emergency department.   Mary-Margaret Hassell Done, FNP

## 2022-06-14 NOTE — Patient Instructions (Signed)

## 2022-06-20 ENCOUNTER — Telehealth: Payer: Self-pay | Admitting: Nurse Practitioner

## 2022-06-20 NOTE — Telephone Encounter (Signed)
Patient aware and verbalizes understanding. 

## 2022-06-20 NOTE — Telephone Encounter (Signed)
Should not need antibiotic refill.

## 2022-06-20 NOTE — Telephone Encounter (Signed)
Pt called stating that he finished the antibiotic today that MMM prescribed him on 06/14/22. Says the antibiotic has helped him a lot but says he doesn't feel 100% better. Wants to know if MMM can send him in a refill?  Please advise and call patient.

## 2022-08-02 DIAGNOSIS — Z4789 Encounter for other orthopedic aftercare: Secondary | ICD-10-CM | POA: Diagnosis not present

## 2022-08-08 DIAGNOSIS — I471 Supraventricular tachycardia, unspecified: Secondary | ICD-10-CM | POA: Diagnosis not present

## 2022-08-08 DIAGNOSIS — I7121 Aneurysm of the ascending aorta, without rupture: Secondary | ICD-10-CM | POA: Diagnosis not present

## 2022-09-13 ENCOUNTER — Ambulatory Visit (INDEPENDENT_AMBULATORY_CARE_PROVIDER_SITE_OTHER): Payer: Medicare HMO | Admitting: Family

## 2022-09-13 ENCOUNTER — Encounter: Payer: Self-pay | Admitting: Family

## 2022-09-13 ENCOUNTER — Telehealth: Payer: Self-pay | Admitting: Family Medicine

## 2022-09-13 VITALS — BP 132/87 | HR 80 | Temp 97.0°F | Ht 70.0 in | Wt 210.0 lb

## 2022-09-13 DIAGNOSIS — R6889 Other general symptoms and signs: Secondary | ICD-10-CM

## 2022-09-13 MED ORDER — BENZONATATE 200 MG PO CAPS: 200.0000 mg | ORAL_CAPSULE | Freq: Three times a day (TID) | ORAL | 1 refills | Status: DC | PRN

## 2022-09-13 MED ORDER — OSELTAMIVIR PHOSPHATE 75 MG PO CAPS: 75.0000 mg | ORAL_CAPSULE | Freq: Two times a day (BID) | ORAL | 0 refills | Status: DC

## 2022-09-13 MED ORDER — PROMETHAZINE-DM 6.25-15 MG/5ML PO SYRP: 5.0000 mL | ORAL_SOLUTION | Freq: Three times a day (TID) | ORAL | 0 refills | Status: DC | PRN

## 2022-09-13 NOTE — Telephone Encounter (Signed)
Left message advising new rx sent into pharmacy and to call back with any further questions or concerns.

## 2022-09-13 NOTE — Telephone Encounter (Signed)
Promethazine-dextromethorphan Prescription sent to pharmacy

## 2022-09-13 NOTE — Patient Instructions (Signed)

## 2022-09-13 NOTE — Progress Notes (Signed)
Subjective:    Patient ID: Jeffrey [name], male    DOB: [birth year], 66 y.o.   MRN: [id number]  Chief Complaint  Patient presents with   Cough    Started Wednesday    Nasal Congestion   Headache    Cough This is a new problem. The current episode started in the past 7 days. The problem has been gradually worsening. The problem occurs every few minutes. Associated symptoms include headaches, nasal congestion and postnasal drip. Pertinent negatives include no chills, ear congestion, ear pain, fever, myalgias, sore throat, shortness of breath, weight loss or wheezing. He has tried rest for the symptoms.  Headache  Associated symptoms include coughing. Pertinent negatives include no ear pain, fever, sore throat or weight loss.      Review of Systems  Constitutional:  Negative for chills, fever and weight loss.  HENT:  Positive for postnasal drip. Negative for ear pain and sore throat.   Respiratory:  Positive for cough. Negative for shortness of breath and wheezing.   Musculoskeletal:  Negative for myalgias.  Neurological:  Positive for headaches.  All other systems reviewed and are negative.      Objective:   Physical Exam Vitals reviewed.  Constitutional:      General: He is not in acute distress.    Appearance: He is well-developed.  HENT:     Head: Normocephalic.     Right Ear: External ear normal.     Left Ear: External ear normal.  Eyes:     General:        Right eye: No discharge.        Left eye: No discharge.     Pupils: Pupils are equal, round, and reactive to light.  Neck:     Thyroid: No thyromegaly.  Cardiovascular:     Rate and Rhythm: Normal rate and regular rhythm.     Heart sounds: Normal heart sounds. No murmur heard. Pulmonary:     Effort: Pulmonary effort is normal. No respiratory distress.     Breath sounds: Normal breath sounds. No wheezing.  Abdominal:     General: Bowel sounds are normal. There is no distension.     Palpations: Abdomen is soft.      Tenderness: There is no abdominal tenderness.  Musculoskeletal:        General: No tenderness. Normal range of motion.     Cervical back: Normal range of motion and neck supple.  Skin:    General: Skin is warm and dry.     Findings: No erythema or rash.  Neurological:     Mental Status: He is alert and oriented to person, place, and time.     Cranial Nerves: No cranial nerve deficit.     Deep Tendon Reflexes: Reflexes are normal and symmetric.  Psychiatric:        Behavior: Behavior normal.        Thought Content: Thought content normal.        Judgment: Judgment normal.     BP 132/87   Pulse 80   Temp (!) 97 F (36.1 C) (Temporal)   Ht '5\' 10"'$  (1.778 m)   Wt 210 lb (95.3 kg)   SpO2 97%   BMI 30.13 kg/m        Assessment & Plan:   Jeffrey [name] comes in today with chief complaint of Cough (Started Wednesday ), Nasal Congestion, and Headache   Diagnosis and orders addressed:  1. Flu-like symptoms Force fluids  Rest  Alternate tylenol and motrin  Droplet precautions  Follow up if symptoms worsen or do not improve  - oseltamivir (TAMIFLU) 75 MG capsule; Take 1 capsule (75 mg total) by mouth 2 (two) times daily.  Dispense: 10 capsule; Refill: 0 - benzonatate (TESSALON) 200 MG capsule; Take 1 capsule (200 mg total) by mouth 3 (three) times daily as needed.  Dispense: 30 capsule; Refill: [place], FNP

## 2022-09-19 DIAGNOSIS — Z0100 Encounter for examination of eyes and vision without abnormal findings: Secondary | ICD-10-CM | POA: Diagnosis not present

## 2022-10-02 ENCOUNTER — Other Ambulatory Visit: Payer: Self-pay | Admitting: Family Medicine

## 2022-10-02 DIAGNOSIS — J0140 Acute pansinusitis, unspecified: Secondary | ICD-10-CM

## 2022-10-14 DIAGNOSIS — I471 Supraventricular tachycardia, unspecified: Secondary | ICD-10-CM | POA: Diagnosis not present

## 2022-10-14 DIAGNOSIS — I7121 Aneurysm of the ascending aorta, without rupture: Secondary | ICD-10-CM | POA: Diagnosis not present

## 2022-10-14 DIAGNOSIS — G4733 Obstructive sleep apnea (adult) (pediatric): Secondary | ICD-10-CM | POA: Diagnosis not present

## 2022-10-19 DIAGNOSIS — Z0100 Encounter for examination of eyes and vision without abnormal findings: Secondary | ICD-10-CM | POA: Diagnosis not present

## 2022-10-24 ENCOUNTER — Other Ambulatory Visit: Payer: Self-pay | Admitting: Family Medicine

## 2022-10-24 DIAGNOSIS — I10 Essential (primary) hypertension: Secondary | ICD-10-CM

## 2022-11-07 DIAGNOSIS — G4733 Obstructive sleep apnea (adult) (pediatric): Secondary | ICD-10-CM | POA: Diagnosis not present

## 2022-11-13 DIAGNOSIS — G4733 Obstructive sleep apnea (adult) (pediatric): Secondary | ICD-10-CM | POA: Diagnosis not present

## 2022-11-15 DIAGNOSIS — H59811 Chorioretinal scars after surgery for detachment, right eye: Secondary | ICD-10-CM | POA: Diagnosis not present

## 2022-11-15 DIAGNOSIS — Z961 Presence of intraocular lens: Secondary | ICD-10-CM | POA: Diagnosis not present

## 2022-11-29 DIAGNOSIS — I471 Supraventricular tachycardia, unspecified: Secondary | ICD-10-CM | POA: Diagnosis not present

## 2022-11-29 DIAGNOSIS — G4733 Obstructive sleep apnea (adult) (pediatric): Secondary | ICD-10-CM | POA: Diagnosis not present

## 2022-12-04 DIAGNOSIS — G4733 Obstructive sleep apnea (adult) (pediatric): Secondary | ICD-10-CM | POA: Diagnosis not present

## 2022-12-16 ENCOUNTER — Other Ambulatory Visit: Payer: Self-pay | Admitting: Family Medicine

## 2022-12-16 DIAGNOSIS — I10 Essential (primary) hypertension: Secondary | ICD-10-CM

## 2022-12-16 NOTE — Telephone Encounter (Signed)
Spoke with pt to schedule appt for CPE, pt stated he would call back to make appt

## 2022-12-16 NOTE — Telephone Encounter (Signed)
Stacks NTBS Last OV 09/27/21 for PE, all others have been acute visits, NO RFs sent or 30-d sent

## 2022-12-20 DIAGNOSIS — Z4789 Encounter for other orthopedic aftercare: Secondary | ICD-10-CM | POA: Diagnosis not present

## 2023-01-04 DIAGNOSIS — G4733 Obstructive sleep apnea (adult) (pediatric): Secondary | ICD-10-CM | POA: Diagnosis not present

## 2023-01-24 DIAGNOSIS — S93491A Sprain of other ligament of right ankle, initial encounter: Secondary | ICD-10-CM | POA: Diagnosis not present

## 2023-01-24 DIAGNOSIS — M25572 Pain in left ankle and joints of left foot: Secondary | ICD-10-CM | POA: Diagnosis not present

## 2023-01-24 DIAGNOSIS — M25571 Pain in right ankle and joints of right foot: Secondary | ICD-10-CM | POA: Diagnosis not present

## 2023-02-03 DIAGNOSIS — G4733 Obstructive sleep apnea (adult) (pediatric): Secondary | ICD-10-CM | POA: Diagnosis not present

## 2023-02-14 DIAGNOSIS — M25571 Pain in right ankle and joints of right foot: Secondary | ICD-10-CM | POA: Diagnosis not present

## 2023-02-21 DIAGNOSIS — S93491A Sprain of other ligament of right ankle, initial encounter: Secondary | ICD-10-CM | POA: Diagnosis not present

## 2023-03-06 DIAGNOSIS — G4733 Obstructive sleep apnea (adult) (pediatric): Secondary | ICD-10-CM | POA: Diagnosis not present

## 2023-03-10 ENCOUNTER — Other Ambulatory Visit: Payer: Self-pay

## 2023-03-10 ENCOUNTER — Ambulatory Visit: Payer: Medicare HMO | Attending: Orthopaedic Surgery

## 2023-03-10 DIAGNOSIS — M25571 Pain in right ankle and joints of right foot: Secondary | ICD-10-CM | POA: Diagnosis not present

## 2023-03-10 DIAGNOSIS — M25671 Stiffness of right ankle, not elsewhere classified: Secondary | ICD-10-CM | POA: Diagnosis not present
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
## 2023-03-11 ENCOUNTER — Encounter: Payer: Self-pay | Admitting: Family Medicine

## 2023-03-11 ENCOUNTER — Ambulatory Visit (INDEPENDENT_AMBULATORY_CARE_PROVIDER_SITE_OTHER): Payer: Medicare HMO | Admitting: Family Medicine

## 2023-03-11 VITALS — BP 123/74 | HR 78 | Temp 97.9°F | Ht 70.0 in | Wt 217.0 lb

## 2023-03-11 DIAGNOSIS — Z20818 Contact with and (suspected) exposure to other bacterial communicable diseases: Secondary | ICD-10-CM

## 2023-03-11 LAB — CULTURE, GROUP A STREP

## 2023-03-11 LAB — RAPID STREP SCREEN (MED CTR MEBANE ONLY): Strep Gp A Ag, IA W/Reflex: NEGATIVE

## 2023-03-11 MED ORDER — AMOXICILLIN 875 MG PO TABS: 875.0000 mg | ORAL_TABLET | Freq: Two times a day (BID) | ORAL | 0 refills | Status: AC

## 2023-03-11 NOTE — Progress Notes (Signed)
No chief complaint on file.   HPI  Patient presents today for moderate sore throat. Patient reports not coughing. There is no fever, chills or sweats. The patient denies being short of breath. Onset was this morning. Had dull HA as well. Denies all other symptoms. Saw his girlfriend 4 days ago. She called today to say she has strep throat.   PMH: Smoking status noted ROS: Per HPI  Objective: BP 123/74   Pulse 78   Temp 97.9 F (36.6 C)   Ht 5\' 10"  (1.778 m)   Wt 217 lb (98.4 kg)   SpO2 95%   BMI 31.14 kg/m  Gen: NAD, alert, cooperative with exam HEENT: NCAT, Nasal passages swollen, red TMS RED CV: RRR, good S1/S2, no murmur Resp: Bronchitis changes with scattered wheezes, non-labored Ext: No edema, warm Neuro: Alert and oriented, No gross deficits  Assessment and plan:  1. Strep throat exposure     Meds ordered this encounter  Medications   amoxicillin (AMOXIL) 875 MG tablet    Sig: Take 1 tablet (875 mg total) by mouth 2 (two) times daily for 10 days.    Dispense:  20 tablet    Refill:  0    Orders Placed This Encounter  Procedures   Rapid Strep Screen ([facility] ONLY)    Follow up as needed.  Mechele Claude, MD

## 2023-03-13 DIAGNOSIS — S93491D Sprain of other ligament of right ankle, subsequent encounter: Secondary | ICD-10-CM | POA: Diagnosis not present

## 2023-03-14 DIAGNOSIS — G4733 Obstructive sleep apnea (adult) (pediatric): Secondary | ICD-10-CM | POA: Diagnosis not present

## 2023-03-14 DIAGNOSIS — I471 Supraventricular tachycardia, unspecified: Secondary | ICD-10-CM | POA: Diagnosis not present

## 2023-03-17 ENCOUNTER — Ambulatory Visit: Payer: Medicare HMO

## 2023-03-17 DIAGNOSIS — M25671 Stiffness of right ankle, not elsewhere classified: Secondary | ICD-10-CM | POA: Diagnosis not present

## 2023-03-17 DIAGNOSIS — M25571 Pain in right ankle and joints of right foot: Secondary | ICD-10-CM | POA: Diagnosis not present
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
## 2023-03-22 DIAGNOSIS — L0889 Other specified local infections of the skin and subcutaneous tissue: Secondary | ICD-10-CM | POA: Diagnosis not present

## 2023-03-24 ENCOUNTER — Ambulatory Visit: Payer: Medicare HMO | Attending: Orthopaedic Surgery

## 2023-03-24 DIAGNOSIS — M25671 Stiffness of right ankle, not elsewhere classified: Secondary | ICD-10-CM | POA: Diagnosis not present

## 2023-03-24 DIAGNOSIS — M25571 Pain in right ankle and joints of right foot: Secondary | ICD-10-CM | POA: Diagnosis not present

## 2023-03-24 DIAGNOSIS — L0889 Other specified local infections of the skin and subcutaneous tissue: Secondary | ICD-10-CM | POA: Diagnosis not present
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
## 2023-03-29 ENCOUNTER — Other Ambulatory Visit: Payer: Self-pay | Admitting: Family Medicine

## 2023-03-29 DIAGNOSIS — J0140 Acute pansinusitis, unspecified: Secondary | ICD-10-CM

## 2023-03-31 ENCOUNTER — Ambulatory Visit: Payer: Medicare HMO

## 2023-03-31 DIAGNOSIS — M25571 Pain in right ankle and joints of right foot: Secondary | ICD-10-CM | POA: Diagnosis not present

## 2023-03-31 DIAGNOSIS — M25671 Stiffness of right ankle, not elsewhere classified: Secondary | ICD-10-CM | POA: Diagnosis not present
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
## 2023-04-02 ENCOUNTER — Other Ambulatory Visit: Payer: Self-pay | Admitting: *Deleted

## 2023-04-02 ENCOUNTER — Telehealth: Payer: Self-pay | Admitting: Family Medicine

## 2023-04-02 DIAGNOSIS — I10 Essential (primary) hypertension: Secondary | ICD-10-CM

## 2023-04-02 MED ORDER — ESOMEPRAZOLE MAGNESIUM 40 MG PO CPDR: 40.0000 mg | DELAYED_RELEASE_CAPSULE | Freq: Every day | ORAL | 0 refills | Status: DC

## 2023-04-02 MED ORDER — AMLODIPINE BESYLATE 5 MG PO TABS
5.0000 mg | ORAL_TABLET | Freq: Every day | ORAL | 0 refills | Status: DC
Start: 2023-04-02 — End: 2023-05-01

## 2023-04-02 NOTE — Telephone Encounter (Signed)
#  30 DAY SUPPLY SENT

## 2023-04-02 NOTE — Telephone Encounter (Signed)
  Prescription Request  04/02/2023  Is this a "Controlled Substance" medicine? no  Have you seen your PCP in the last 2 weeks? No has appt on 08/12  If YES, route message to pool  -  If NO, patient needs to be scheduled for appointment.  What is the name of the medication or equipment? amLODipine (NORVASC) 5 MG tablet   esomeprazole (NEXIUM) 40 MG capsule   Have you contacted your pharmacy to request a refill? yes   Which pharmacy would you like this sent to? [facility]   Patient notified that their request is being sent to the clinical staff for review and that they should receive a response within 2 business days.

## 2023-04-05 DIAGNOSIS — G4733 Obstructive sleep apnea (adult) (pediatric): Secondary | ICD-10-CM | POA: Diagnosis not present

## 2023-05-01 ENCOUNTER — Other Ambulatory Visit: Payer: Self-pay | Admitting: Family Medicine

## 2023-05-01 DIAGNOSIS — I10 Essential (primary) hypertension: Secondary | ICD-10-CM

## 2023-05-05 ENCOUNTER — Encounter: Payer: Self-pay | Admitting: Family Medicine

## 2023-05-05 ENCOUNTER — Ambulatory Visit (INDEPENDENT_AMBULATORY_CARE_PROVIDER_SITE_OTHER): Payer: Medicare HMO | Admitting: Family Medicine

## 2023-05-05 VITALS — BP 121/77 | HR 76 | Temp 97.8°F | Ht 70.0 in | Wt 218.8 lb

## 2023-05-05 DIAGNOSIS — E559 Vitamin D deficiency, unspecified: Secondary | ICD-10-CM | POA: Diagnosis not present

## 2023-05-05 DIAGNOSIS — I10 Essential (primary) hypertension: Secondary | ICD-10-CM | POA: Diagnosis not present

## 2023-05-05 DIAGNOSIS — Z125 Encounter for screening for malignant neoplasm of prostate: Secondary | ICD-10-CM

## 2023-05-05 MED ORDER — AMLODIPINE BESYLATE 5 MG PO TABS
5.0000 mg | ORAL_TABLET | Freq: Every day | ORAL | 3 refills | Status: DC
Start: 2023-05-05 — End: 2024-04-27

## 2023-05-05 MED ORDER — ESOMEPRAZOLE MAGNESIUM 40 MG PO CPDR: 40.0000 mg | DELAYED_RELEASE_CAPSULE | Freq: Every day | ORAL | 3 refills | Status: DC

## 2023-05-05 MED ORDER — MELOXICAM 15 MG PO TABS: 15.0000 mg | ORAL_TABLET | Freq: Every day | ORAL | 3 refills | Status: DC
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
## 2023-05-06 DIAGNOSIS — G4733 Obstructive sleep apnea (adult) (pediatric): Secondary | ICD-10-CM | POA: Diagnosis not present

## 2023-05-07 ENCOUNTER — Other Ambulatory Visit: Payer: Medicare HMO

## 2023-05-07 ENCOUNTER — Other Ambulatory Visit: Payer: Self-pay | Admitting: *Deleted

## 2023-05-07 DIAGNOSIS — Z0000 Encounter for general adult medical examination without abnormal findings: Secondary | ICD-10-CM | POA: Diagnosis not present

## 2023-05-07 DIAGNOSIS — Z125 Encounter for screening for malignant neoplasm of prostate: Secondary | ICD-10-CM | POA: Diagnosis not present

## 2023-05-07 DIAGNOSIS — E559 Vitamin D deficiency, unspecified: Secondary | ICD-10-CM | POA: Diagnosis not present

## 2023-05-07 DIAGNOSIS — I10 Essential (primary) hypertension: Secondary | ICD-10-CM | POA: Diagnosis not present

## 2023-05-07 LAB — URINALYSIS
Bilirubin, UA: NEGATIVE
Glucose, UA: NEGATIVE
Ketones, UA: NEGATIVE
Leukocytes,UA: NEGATIVE
Nitrite, UA: NEGATIVE
Protein,UA: NEGATIVE
RBC, UA: NEGATIVE
Specific Gravity, UA: 1.015 (ref 1.005–1.030)
Urobilinogen, Ur: 0.2 mg/dL (ref 0.2–1.0)
pH, UA: 7 (ref 5.0–7.5)

## 2023-05-08 NOTE — Progress Notes (Signed)
Dear Jeffrey [name], Your Vitamin D is  low. You need a prescription strength supplement I will send that in for you.  Your cholesterol is mildly elevated. Please follow a low-fat diet and exercise regularly.  Nurse, if at all possible, could you send in a prescription for the patient for vitamin D 50,000 units, 1 p.o. weekly #13 with 3 refills? Many thanks, WS

## 2023-05-09 ENCOUNTER — Other Ambulatory Visit: Payer: Self-pay | Admitting: *Deleted

## 2023-05-09 MED ORDER — ERGOCALCIFEROL 1.25 MG (50000 UT) PO CAPS: 50000.0000 [IU] | ORAL_CAPSULE | ORAL | 3 refills | Status: DC

## 2023-06-05 DIAGNOSIS — G4733 Obstructive sleep apnea (adult) (pediatric): Secondary | ICD-10-CM | POA: Diagnosis not present

## 2023-06-06 DIAGNOSIS — G4733 Obstructive sleep apnea (adult) (pediatric): Secondary | ICD-10-CM | POA: Diagnosis not present

## 2023-06-09 ENCOUNTER — Ambulatory Visit (INDEPENDENT_AMBULATORY_CARE_PROVIDER_SITE_OTHER): Payer: Medicare HMO | Admitting: Family Medicine

## 2023-06-09 ENCOUNTER — Encounter: Payer: Self-pay | Admitting: Family Medicine

## 2023-06-09 VITALS — BP 133/84 | HR 73 | Temp 97.9°F | Ht 70.0 in | Wt 217.2 lb

## 2023-06-09 DIAGNOSIS — I10 Essential (primary) hypertension: Secondary | ICD-10-CM | POA: Diagnosis not present

## 2023-06-09 DIAGNOSIS — N529 Male erectile dysfunction, unspecified: Secondary | ICD-10-CM | POA: Diagnosis not present

## 2023-06-09 DIAGNOSIS — Z0000 Encounter for general adult medical examination without abnormal findings: Secondary | ICD-10-CM

## 2023-06-09 DIAGNOSIS — M19071 Primary osteoarthritis, right ankle and foot: Secondary | ICD-10-CM

## 2023-06-09 DIAGNOSIS — Z0001 Encounter for general adult medical examination with abnormal findings: Secondary | ICD-10-CM | POA: Diagnosis not present

## 2023-06-09 MED ORDER — SILDENAFIL CITRATE 100 MG PO TABS: 50.0000 mg | ORAL_TABLET | Freq: Every day | ORAL | 11 refills | Status: DC | PRN
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
## 2023-06-10 ENCOUNTER — Encounter: Payer: Self-pay | Admitting: Family Medicine

## 2023-06-10 DIAGNOSIS — G4733 Obstructive sleep apnea (adult) (pediatric): Secondary | ICD-10-CM | POA: Diagnosis not present

## 2023-06-24 DIAGNOSIS — G4733 Obstructive sleep apnea (adult) (pediatric): Secondary | ICD-10-CM | POA: Diagnosis not present

## 2023-07-01 DIAGNOSIS — G4733 Obstructive sleep apnea (adult) (pediatric): Secondary | ICD-10-CM | POA: Diagnosis not present

## 2023-07-16 ENCOUNTER — Ambulatory Visit: Payer: Medicare HMO | Admitting: Family Medicine

## 2023-07-16 ENCOUNTER — Encounter: Payer: Self-pay | Admitting: Family Medicine

## 2023-07-16 VITALS — BP 158/96 | HR 81 | Ht 70.0 in | Wt 218.0 lb

## 2023-07-16 DIAGNOSIS — Z23 Encounter for immunization: Secondary | ICD-10-CM | POA: Diagnosis not present

## 2023-07-16 DIAGNOSIS — S161XXA Strain of muscle, fascia and tendon at neck level, initial encounter: Secondary | ICD-10-CM

## 2023-07-16 MED ORDER — METHYLPREDNISOLONE ACETATE 80 MG/ML IJ SUSP
80.0000 mg | Freq: Once | INTRAMUSCULAR | Status: AC
Start: 2023-07-16 — End: 2023-07-16
  Administered 2023-07-16: 80 mg via INTRA_ARTICULAR

## 2023-07-16 MED ORDER — CYCLOBENZAPRINE HCL 10 MG PO TABS: 10.0000 mg | ORAL_TABLET | Freq: Three times a day (TID) | ORAL | 0 refills | Status: AC | PRN

## 2023-07-16 NOTE — Progress Notes (Signed)
BP (!) 158/96   Pulse 81   Ht 5\' 10"  (1.778 m)   Wt 218 lb (98.9 kg)   SpO2 96%   BMI 31.28 kg/m    Subjective:   Patient ID: Jeffrey [name], male    DOB: [birth year], 67 y.o.   MRN: [id number]  HPI: Jeffrey [name] is a 67 y.o. male presenting on 07/16/2023 for Shoulder Pain (Left. 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  He says he will feel tight on the left side of the neck and around the scapular region and then sometimes if he sits in a specific way or reaches down for something he will feel shooting pain down to his upper arm in the triceps region.  He has been taking meloxicam and ibuprofen to help with this and it does help a little bit.  He cannot recall any specific trauma but just that it has been more irritated.  He denies any numbness or weakness.  He says sometimes shooting his arm up over his head while he sleeping helps relieve some of the shooting pain when he has it.  It is intermittent and not constant.  He does have a history of a herniated disc surgery about 40 years ago.  Relevant past medical, surgical, family and social history reviewed and updated as indicated. Interim medical history since our last visit reviewed. Allergies and medications reviewed and updated.  Review of Systems  Constitutional:  Negative for chills and fever.  Respiratory:  Negative for shortness of breath and wheezing.   Cardiovascular:  Negative for chest pain and leg swelling.  Musculoskeletal:  Positive for arthralgias, myalgias and neck pain. Negative for back pain and gait problem.  Skin:  Negative for rash.  Neurological:  Negative for weakness and numbness.  All other systems reviewed and are negative.   Per HPI unless specifically indicated above   Allergies as of 07/16/2023       Reactions    Sulfonamide Derivatives Rash        Medication List        Accurate as of July 16, 2023  8:29 AM. If you have any questions, ask your nurse or doctor.          acetaminophen 650 MG CR tablet Commonly known as: TYLENOL Take 650 mg by mouth every 8 (eight) hours as needed for pain.   amLODipine 5 MG tablet Commonly known as: NORVASC Take 1 tablet (5 mg total) by mouth daily.   cyclobenzaprine 10 MG tablet Commonly known as: FLEXERIL Take 1 tablet (10 mg total) by mouth 3 (three) times daily as needed for muscle spasms. Started by: Elige Radon Antwion Carpenter   diclofenac Sodium 1 % Gel Commonly known as: VOLTAREN Apply 2 g topically 2 (two) times daily as needed (pain).   ergocalciferol 1.25 MG (50000 UT) capsule Commonly known as: Drisdol Take 1 capsule (50,000 Units total) by mouth once a week.   esomeprazole 40 MG capsule Commonly known as: NEXIUM Take 1 capsule (40 mg total) by mouth daily.   fluticasone 50 MCG/ACT nasal spray Commonly known as: FLONASE SPRAY 2 SPRAYS INTO EACH NOSTRIL EVERY DAY   meloxicam 15 MG tablet Commonly known as: MOBIC Take 1 tablet (15 mg total) by mouth daily.   sildenafil  100 MG tablet Commonly known as: Viagra Take 0.5-1 tablets (50-100 mg total) by mouth daily as needed for erectile dysfunction (sex).         Objective:   BP (!) 158/96   Pulse 81   Ht 5\' 10"  (1.778 m)   Wt 218 lb (98.9 kg)   SpO2 96%   BMI 31.28 kg/m   Wt Readings from Last 3 Encounters:  07/16/23 218 lb (98.9 kg)  06/09/23 217 lb 3.2 oz (98.5 kg)  05/05/23 218 lb 12.8 oz (99.2 kg)    Physical Exam Vitals and nursing note reviewed.  Constitutional:      General: He is not in acute distress.    Appearance: He is well-developed. He is not diaphoretic.  Eyes:     General: No scleral icterus.    Conjunctiva/sclera: Conjunctivae normal.  Neck:     Thyroid: No thyromegaly.  Musculoskeletal:        General: Normal range of motion.     Cervical  back: Tenderness present. No erythema or spasms. Normal range of motion.     Thoracic back: Tenderness present. No deformity, spasms or bony tenderness. Normal range of motion.       Back:  Skin:    General: Skin is warm and dry.     Findings: No rash.  Neurological:     Mental Status: He is alert and oriented to person, place, and time.     Coordination: Coordination normal.  Psychiatric:        Behavior: Behavior normal.       Assessment & Plan:   Problem List Items Addressed This Visit   None Visit Diagnoses     Strain of neck muscle, initial encounter    -  Primary   Relevant Medications   methylPREDNISolone acetate (DEPO-MEDROL) injection 80 mg (Start on 07/16/2023  8:30 AM)   cyclobenzaprine (FLEXERIL) 10 MG tablet   Encounter for immunization       Relevant Orders   Flu Vaccine Trivalent High Dose (Fluad) (Completed)       Will give muscle relaxer and try some steroids and he is also going to see chiropractor and if not improved from there, may discuss imaging in the future.  I do think there is some component that is a muscle strain but that he has some component of nerve impingement especially with it showing improvement with him putting his arm over his head.  We will see if we can knock down the inflammation first with the steroids and muscle relaxer and then he has continued taking meloxicam Follow up plan: Return if symptoms worsen or fail to improve.  Counseling provided for all of the vaccine components Orders Placed This Encounter  Procedures   Flu Vaccine Trivalent High Dose (Fluad)    Arville Care, MD [facility] 07/16/2023, 8:29 AM

## 2023-07-23 DIAGNOSIS — M9901 Segmental and somatic dysfunction of cervical region: Secondary | ICD-10-CM | POA: Diagnosis not present

## 2023-07-23 DIAGNOSIS — M9902 Segmental and somatic dysfunction of thoracic region: Secondary | ICD-10-CM | POA: Diagnosis not present

## 2023-07-23 DIAGNOSIS — M5032 Other cervical disc degeneration, mid-cervical region, unspecified level: Secondary | ICD-10-CM | POA: Diagnosis not present

## 2023-07-23 DIAGNOSIS — M531 Cervicobrachial syndrome: Secondary | ICD-10-CM | POA: Diagnosis not present

## 2023-07-29 DIAGNOSIS — M9901 Segmental and somatic dysfunction of cervical region: Secondary | ICD-10-CM | POA: Diagnosis not present

## 2023-07-29 DIAGNOSIS — M5032 Other cervical disc degeneration, mid-cervical region, unspecified level: Secondary | ICD-10-CM | POA: Diagnosis not present

## 2023-07-29 DIAGNOSIS — M9902 Segmental and somatic dysfunction of thoracic region: Secondary | ICD-10-CM | POA: Diagnosis not present

## 2023-07-29 DIAGNOSIS — M531 Cervicobrachial syndrome: Secondary | ICD-10-CM | POA: Diagnosis not present

## 2023-07-31 DIAGNOSIS — M9902 Segmental and somatic dysfunction of thoracic region: Secondary | ICD-10-CM | POA: Diagnosis not present

## 2023-07-31 DIAGNOSIS — M62830 Muscle spasm of back: Secondary | ICD-10-CM | POA: Diagnosis not present

## 2023-07-31 DIAGNOSIS — M9901 Segmental and somatic dysfunction of cervical region: Secondary | ICD-10-CM | POA: Diagnosis not present

## 2023-07-31 DIAGNOSIS — M5032 Other cervical disc degeneration, mid-cervical region, unspecified level: Secondary | ICD-10-CM | POA: Diagnosis not present

## 2023-07-31 DIAGNOSIS — M9903 Segmental and somatic dysfunction of lumbar region: Secondary | ICD-10-CM | POA: Diagnosis not present

## 2023-07-31 DIAGNOSIS — M531 Cervicobrachial syndrome: Secondary | ICD-10-CM | POA: Diagnosis not present

## 2023-08-01 DIAGNOSIS — G4733 Obstructive sleep apnea (adult) (pediatric): Secondary | ICD-10-CM | POA: Diagnosis not present

## 2023-08-05 DIAGNOSIS — M62830 Muscle spasm of back: Secondary | ICD-10-CM | POA: Diagnosis not present

## 2023-08-05 DIAGNOSIS — M9902 Segmental and somatic dysfunction of thoracic region: Secondary | ICD-10-CM | POA: Diagnosis not present

## 2023-08-05 DIAGNOSIS — M5032 Other cervical disc degeneration, mid-cervical region, unspecified level: Secondary | ICD-10-CM | POA: Diagnosis not present

## 2023-08-05 DIAGNOSIS — M9903 Segmental and somatic dysfunction of lumbar region: Secondary | ICD-10-CM | POA: Diagnosis not present

## 2023-08-05 DIAGNOSIS — M531 Cervicobrachial syndrome: Secondary | ICD-10-CM | POA: Diagnosis not present

## 2023-08-05 DIAGNOSIS — M9901 Segmental and somatic dysfunction of cervical region: Secondary | ICD-10-CM | POA: Diagnosis not present

## 2023-08-07 DIAGNOSIS — M9901 Segmental and somatic dysfunction of cervical region: Secondary | ICD-10-CM | POA: Diagnosis not present

## 2023-08-07 DIAGNOSIS — M62830 Muscle spasm of back: Secondary | ICD-10-CM | POA: Diagnosis not present

## 2023-08-07 DIAGNOSIS — M9903 Segmental and somatic dysfunction of lumbar region: Secondary | ICD-10-CM | POA: Diagnosis not present

## 2023-08-07 DIAGNOSIS — M5032 Other cervical disc degeneration, mid-cervical region, unspecified level: Secondary | ICD-10-CM | POA: Diagnosis not present

## 2023-08-07 DIAGNOSIS — M531 Cervicobrachial syndrome: Secondary | ICD-10-CM | POA: Diagnosis not present

## 2023-08-07 DIAGNOSIS — M9902 Segmental and somatic dysfunction of thoracic region: Secondary | ICD-10-CM | POA: Diagnosis not present

## 2023-08-12 DIAGNOSIS — M9901 Segmental and somatic dysfunction of cervical region: Secondary | ICD-10-CM | POA: Diagnosis not present

## 2023-08-12 DIAGNOSIS — M5032 Other cervical disc degeneration, mid-cervical region, unspecified level: Secondary | ICD-10-CM | POA: Diagnosis not present

## 2023-08-12 DIAGNOSIS — M9903 Segmental and somatic dysfunction of lumbar region: Secondary | ICD-10-CM | POA: Diagnosis not present

## 2023-08-12 DIAGNOSIS — M62830 Muscle spasm of back: Secondary | ICD-10-CM | POA: Diagnosis not present

## 2023-08-12 DIAGNOSIS — M531 Cervicobrachial syndrome: Secondary | ICD-10-CM | POA: Diagnosis not present

## 2023-08-12 DIAGNOSIS — M9902 Segmental and somatic dysfunction of thoracic region: Secondary | ICD-10-CM | POA: Diagnosis not present

## 2023-08-14 DIAGNOSIS — M5032 Other cervical disc degeneration, mid-cervical region, unspecified level: Secondary | ICD-10-CM | POA: Diagnosis not present

## 2023-08-14 DIAGNOSIS — M62830 Muscle spasm of back: Secondary | ICD-10-CM | POA: Diagnosis not present

## 2023-08-14 DIAGNOSIS — M9903 Segmental and somatic dysfunction of lumbar region: Secondary | ICD-10-CM | POA: Diagnosis not present

## 2023-08-14 DIAGNOSIS — M9901 Segmental and somatic dysfunction of cervical region: Secondary | ICD-10-CM | POA: Diagnosis not present

## 2023-08-14 DIAGNOSIS — M9902 Segmental and somatic dysfunction of thoracic region: Secondary | ICD-10-CM | POA: Diagnosis not present

## 2023-08-14 DIAGNOSIS — M531 Cervicobrachial syndrome: Secondary | ICD-10-CM | POA: Diagnosis not present

## 2023-08-19 DIAGNOSIS — M5032 Other cervical disc degeneration, mid-cervical region, unspecified level: Secondary | ICD-10-CM | POA: Diagnosis not present

## 2023-08-19 DIAGNOSIS — M9903 Segmental and somatic dysfunction of lumbar region: Secondary | ICD-10-CM | POA: Diagnosis not present

## 2023-08-19 DIAGNOSIS — M531 Cervicobrachial syndrome: Secondary | ICD-10-CM | POA: Diagnosis not present

## 2023-08-19 DIAGNOSIS — M9902 Segmental and somatic dysfunction of thoracic region: Secondary | ICD-10-CM | POA: Diagnosis not present

## 2023-08-19 DIAGNOSIS — M62830 Muscle spasm of back: Secondary | ICD-10-CM | POA: Diagnosis not present

## 2023-08-19 DIAGNOSIS — M9901 Segmental and somatic dysfunction of cervical region: Secondary | ICD-10-CM | POA: Diagnosis not present

## 2023-08-28 DIAGNOSIS — R69 Illness, unspecified: Secondary | ICD-10-CM | POA: Diagnosis not present

## 2023-08-31 DIAGNOSIS — G4733 Obstructive sleep apnea (adult) (pediatric): Secondary | ICD-10-CM | POA: Diagnosis not present

## 2023-09-02 DIAGNOSIS — M5032 Other cervical disc degeneration, mid-cervical region, unspecified level: Secondary | ICD-10-CM | POA: Diagnosis not present

## 2023-09-02 DIAGNOSIS — M9901 Segmental and somatic dysfunction of cervical region: Secondary | ICD-10-CM | POA: Diagnosis not present

## 2023-09-02 DIAGNOSIS — M531 Cervicobrachial syndrome: Secondary | ICD-10-CM | POA: Diagnosis not present

## 2023-09-02 DIAGNOSIS — M9902 Segmental and somatic dysfunction of thoracic region: Secondary | ICD-10-CM | POA: Diagnosis not present

## 2023-09-02 DIAGNOSIS — M62830 Muscle spasm of back: Secondary | ICD-10-CM | POA: Diagnosis not present

## 2023-09-02 DIAGNOSIS — M9903 Segmental and somatic dysfunction of lumbar region: Secondary | ICD-10-CM | POA: Diagnosis not present

## 2023-09-11 ENCOUNTER — Encounter: Payer: Self-pay | Admitting: Nurse Practitioner

## 2023-09-11 ENCOUNTER — Ambulatory Visit (INDEPENDENT_AMBULATORY_CARE_PROVIDER_SITE_OTHER): Payer: Medicare HMO | Admitting: Nurse Practitioner

## 2023-09-11 VITALS — BP 140/89 | HR 76 | Temp 97.9°F | Ht 70.0 in | Wt 218.0 lb

## 2023-09-11 DIAGNOSIS — H1033 Unspecified acute conjunctivitis, bilateral: Secondary | ICD-10-CM

## 2023-09-11 MED ORDER — CIPROFLOXACIN HCL 0.3 % OP SOLN
2.0000 [drp] | OPHTHALMIC | 0 refills | Status: DC
Start: 2023-09-11 — End: 2024-05-26

## 2023-09-11 NOTE — Progress Notes (Signed)
Subjective:    Patient ID: Jeffrey [name], male    DOB: [birth year], 67 y.o.   MRN: [id number]   Chief Complaint: Eyes watery (Girlfriend had pink eye last week)   Was exposed to pink eye.  Conjunctivitis  The current episode started today. The onset was sudden. The problem occurs continuously. The problem has been gradually worsening. The problem is mild. Nothing relieves the symptoms. Nothing aggravates the symptoms. Pertinent negatives include no abdominal pain, no headaches, no rash and no eye pain.    Patient Active Problem List   Diagnosis Date Noted   Vasculogenic erectile dysfunction 06/09/2023   Arthritis of right ankle 06/09/2023   Osteoarthritis of left knee 11/12/2021   Obesity (BMI 30-39.9) 03/29/2017   Itching 06/08/2015   Hypertension    Lumbar radiculopathy, acute 09/22/2013       Review of Systems  Constitutional:  Negative for diaphoresis.  Eyes:  Negative for pain.  Respiratory:  Negative for shortness of breath.   Cardiovascular:  Negative for chest pain, palpitations and leg swelling.  Gastrointestinal:  Negative for abdominal pain.  Endocrine: Negative for polydipsia.  Skin:  Negative for rash.  Neurological:  Negative for dizziness, weakness and headaches.  Hematological:  Does not bruise/bleed easily.  All other systems reviewed and are negative.      Objective:   Physical Exam Constitutional:      Appearance: Normal appearance.  Eyes:     General:        Right eye: Discharge present.        Left eye: Discharge present.    Pupils: Pupils are equal, round, and reactive to light.  Cardiovascular:     Rate and Rhythm: Normal rate and regular rhythm.     Heart sounds: Normal heart sounds.  Pulmonary:     Effort: Pulmonary effort is normal.     Breath sounds: Normal breath sounds.  Skin:    General: Skin is warm.  Neurological:     General: No focal deficit present.     Mental Status: He is alert and oriented to person, place, and time.   Psychiatric:        Mood and Affect: Mood normal.        Behavior: Behavior normal.     BP (!) 140/89   Pulse 76   Temp 97.9 F (36.6 C) (Temporal)   Ht 5\' 10"  (1.778 m)   Wt 218 lb (98.9 kg)   SpO2 94%   BMI 31.28 kg/m        Assessment & Plan:   [name] in today with chief complaint of Eyes watery (Girlfriend had pink eye last week)   1. Acute bacterial conjunctivitis of both eyes (Primary) Good handwashing Cool compresses  Meds ordered this encounter  Medications   ciprofloxacin (CILOXAN) 0.3 % ophthalmic solution    Sig: Place 2 drops into both eyes every 2 (two) hours. Administer 1 drop, every 2 hours, while awake, for 2 days. Then 1 drop, every 4 hours, while awake, for the next 5 days.    Dispense:  5 mL    Refill:  0    Supervising Provider:   Arville Care A [[id number]]       The above assessment and management plan was discussed with the patient. The patient verbalized understanding of and has agreed to the management plan. Patient is aware to call the clinic if symptoms persist or worsen. Patient is aware when to return to the clinic  for a follow-up visit. Patient educated on when it is appropriate to go to the emergency department.   Jeffrey Daphine Deutscher, FNP

## 2023-09-25 DIAGNOSIS — I471 Supraventricular tachycardia, unspecified: Secondary | ICD-10-CM | POA: Diagnosis not present

## 2023-09-25 DIAGNOSIS — G4733 Obstructive sleep apnea (adult) (pediatric): Secondary | ICD-10-CM | POA: Diagnosis not present

## 2023-09-30 ENCOUNTER — Other Ambulatory Visit: Payer: Self-pay | Admitting: Family Medicine

## 2023-09-30 DIAGNOSIS — J0140 Acute pansinusitis, unspecified: Secondary | ICD-10-CM

## 2023-10-01 DIAGNOSIS — G4733 Obstructive sleep apnea (adult) (pediatric): Secondary | ICD-10-CM | POA: Diagnosis not present

## 2023-10-02 DIAGNOSIS — G4733 Obstructive sleep apnea (adult) (pediatric): Secondary | ICD-10-CM | POA: Diagnosis not present

## 2023-10-28 DIAGNOSIS — M62830 Muscle spasm of back: Secondary | ICD-10-CM | POA: Diagnosis not present

## 2023-10-28 DIAGNOSIS — M9902 Segmental and somatic dysfunction of thoracic region: Secondary | ICD-10-CM | POA: Diagnosis not present

## 2023-10-28 DIAGNOSIS — M531 Cervicobrachial syndrome: Secondary | ICD-10-CM | POA: Diagnosis not present

## 2023-10-28 DIAGNOSIS — M9901 Segmental and somatic dysfunction of cervical region: Secondary | ICD-10-CM | POA: Diagnosis not present

## 2023-10-28 DIAGNOSIS — M5032 Other cervical disc degeneration, mid-cervical region, unspecified level: Secondary | ICD-10-CM | POA: Diagnosis not present

## 2023-10-28 DIAGNOSIS — M9903 Segmental and somatic dysfunction of lumbar region: Secondary | ICD-10-CM | POA: Diagnosis not present

## 2023-11-01 DIAGNOSIS — G4733 Obstructive sleep apnea (adult) (pediatric): Secondary | ICD-10-CM | POA: Diagnosis not present

## 2023-11-06 ENCOUNTER — Encounter: Payer: Self-pay | Admitting: Family Medicine

## 2023-11-06 ENCOUNTER — Ambulatory Visit (INDEPENDENT_AMBULATORY_CARE_PROVIDER_SITE_OTHER): Payer: Medicare HMO | Admitting: Family Medicine

## 2023-11-06 VITALS — BP 134/79 | HR 79 | Temp 97.4°F | Ht 70.0 in | Wt 227.0 lb

## 2023-11-06 DIAGNOSIS — N401 Enlarged prostate with lower urinary tract symptoms: Secondary | ICD-10-CM

## 2023-11-06 DIAGNOSIS — R350 Frequency of micturition: Secondary | ICD-10-CM

## 2023-11-06 DIAGNOSIS — N529 Male erectile dysfunction, unspecified: Secondary | ICD-10-CM | POA: Diagnosis not present

## 2023-11-06 DIAGNOSIS — K5900 Constipation, unspecified: Secondary | ICD-10-CM | POA: Diagnosis not present

## 2023-11-06 MED ORDER — PSYLLIUM 30.9 % PO POWD: ORAL | Status: AC

## 2023-11-06 MED ORDER — POLYETHYLENE GLYCOL 3350 17 GM/SCOOP PO POWD: 17.0000 g | Freq: Two times a day (BID) | ORAL | 1 refills | Status: AC | PRN

## 2023-11-06 MED ORDER — TADALAFIL 5 MG PO TABS: 5.0000 mg | ORAL_TABLET | Freq: Every day | ORAL | 11 refills | Status: AC
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
## 2023-11-18 DIAGNOSIS — Z6830 Body mass index (BMI) 30.0-30.9, adult: Secondary | ICD-10-CM | POA: Diagnosis not present

## 2023-11-18 DIAGNOSIS — G4733 Obstructive sleep apnea (adult) (pediatric): Secondary | ICD-10-CM | POA: Diagnosis not present

## 2023-11-18 DIAGNOSIS — Z791 Long term (current) use of non-steroidal anti-inflammatories (NSAID): Secondary | ICD-10-CM | POA: Diagnosis not present

## 2023-11-18 DIAGNOSIS — Z8249 Family history of ischemic heart disease and other diseases of the circulatory system: Secondary | ICD-10-CM | POA: Diagnosis not present

## 2023-11-18 DIAGNOSIS — Z7982 Long term (current) use of aspirin: Secondary | ICD-10-CM | POA: Diagnosis not present

## 2023-11-18 DIAGNOSIS — E669 Obesity, unspecified: Secondary | ICD-10-CM | POA: Diagnosis not present

## 2023-11-18 DIAGNOSIS — Z882 Allergy status to sulfonamides status: Secondary | ICD-10-CM | POA: Diagnosis not present

## 2023-11-18 DIAGNOSIS — N529 Male erectile dysfunction, unspecified: Secondary | ICD-10-CM | POA: Diagnosis not present

## 2023-11-18 DIAGNOSIS — Z79899 Other long term (current) drug therapy: Secondary | ICD-10-CM | POA: Diagnosis not present

## 2023-11-18 DIAGNOSIS — M1990 Unspecified osteoarthritis, unspecified site: Secondary | ICD-10-CM | POA: Diagnosis not present

## 2023-11-18 DIAGNOSIS — N182 Chronic kidney disease, stage 2 (mild): Secondary | ICD-10-CM | POA: Diagnosis not present

## 2023-11-18 DIAGNOSIS — K219 Gastro-esophageal reflux disease without esophagitis: Secondary | ICD-10-CM | POA: Diagnosis not present

## 2023-11-29 DIAGNOSIS — G4733 Obstructive sleep apnea (adult) (pediatric): Secondary | ICD-10-CM | POA: Diagnosis not present

## 2023-12-04 DIAGNOSIS — M5386 Other specified dorsopathies, lumbar region: Secondary | ICD-10-CM | POA: Diagnosis not present

## 2023-12-04 DIAGNOSIS — M9905 Segmental and somatic dysfunction of pelvic region: Secondary | ICD-10-CM | POA: Diagnosis not present

## 2023-12-04 DIAGNOSIS — M5417 Radiculopathy, lumbosacral region: Secondary | ICD-10-CM | POA: Diagnosis not present

## 2023-12-04 DIAGNOSIS — M9902 Segmental and somatic dysfunction of thoracic region: Secondary | ICD-10-CM | POA: Diagnosis not present

## 2023-12-04 DIAGNOSIS — M9903 Segmental and somatic dysfunction of lumbar region: Secondary | ICD-10-CM | POA: Diagnosis not present

## 2023-12-08 DIAGNOSIS — M9902 Segmental and somatic dysfunction of thoracic region: Secondary | ICD-10-CM | POA: Diagnosis not present

## 2023-12-08 DIAGNOSIS — M9905 Segmental and somatic dysfunction of pelvic region: Secondary | ICD-10-CM | POA: Diagnosis not present

## 2023-12-08 DIAGNOSIS — M9903 Segmental and somatic dysfunction of lumbar region: Secondary | ICD-10-CM | POA: Diagnosis not present

## 2023-12-08 DIAGNOSIS — M5417 Radiculopathy, lumbosacral region: Secondary | ICD-10-CM | POA: Diagnosis not present

## 2023-12-08 DIAGNOSIS — M5386 Other specified dorsopathies, lumbar region: Secondary | ICD-10-CM | POA: Diagnosis not present

## 2023-12-10 DIAGNOSIS — M9905 Segmental and somatic dysfunction of pelvic region: Secondary | ICD-10-CM | POA: Diagnosis not present

## 2023-12-10 DIAGNOSIS — M5386 Other specified dorsopathies, lumbar region: Secondary | ICD-10-CM | POA: Diagnosis not present

## 2023-12-10 DIAGNOSIS — M9903 Segmental and somatic dysfunction of lumbar region: Secondary | ICD-10-CM | POA: Diagnosis not present

## 2023-12-10 DIAGNOSIS — M5417 Radiculopathy, lumbosacral region: Secondary | ICD-10-CM | POA: Diagnosis not present

## 2023-12-10 DIAGNOSIS — M9902 Segmental and somatic dysfunction of thoracic region: Secondary | ICD-10-CM | POA: Diagnosis not present

## 2023-12-15 DIAGNOSIS — M9905 Segmental and somatic dysfunction of pelvic region: Secondary | ICD-10-CM | POA: Diagnosis not present

## 2023-12-15 DIAGNOSIS — M5417 Radiculopathy, lumbosacral region: Secondary | ICD-10-CM | POA: Diagnosis not present

## 2023-12-15 DIAGNOSIS — M9902 Segmental and somatic dysfunction of thoracic region: Secondary | ICD-10-CM | POA: Diagnosis not present

## 2023-12-15 DIAGNOSIS — M9903 Segmental and somatic dysfunction of lumbar region: Secondary | ICD-10-CM | POA: Diagnosis not present

## 2023-12-15 DIAGNOSIS — M5386 Other specified dorsopathies, lumbar region: Secondary | ICD-10-CM | POA: Diagnosis not present

## 2023-12-17 DIAGNOSIS — M9902 Segmental and somatic dysfunction of thoracic region: Secondary | ICD-10-CM | POA: Diagnosis not present

## 2023-12-17 DIAGNOSIS — M9903 Segmental and somatic dysfunction of lumbar region: Secondary | ICD-10-CM | POA: Diagnosis not present

## 2023-12-17 DIAGNOSIS — M9905 Segmental and somatic dysfunction of pelvic region: Secondary | ICD-10-CM | POA: Diagnosis not present

## 2023-12-17 DIAGNOSIS — M5386 Other specified dorsopathies, lumbar region: Secondary | ICD-10-CM | POA: Diagnosis not present

## 2023-12-17 DIAGNOSIS — M5417 Radiculopathy, lumbosacral region: Secondary | ICD-10-CM | POA: Diagnosis not present

## 2023-12-22 DIAGNOSIS — M9905 Segmental and somatic dysfunction of pelvic region: Secondary | ICD-10-CM | POA: Diagnosis not present

## 2023-12-22 DIAGNOSIS — M5417 Radiculopathy, lumbosacral region: Secondary | ICD-10-CM | POA: Diagnosis not present

## 2023-12-22 DIAGNOSIS — M5386 Other specified dorsopathies, lumbar region: Secondary | ICD-10-CM | POA: Diagnosis not present

## 2023-12-22 DIAGNOSIS — M9902 Segmental and somatic dysfunction of thoracic region: Secondary | ICD-10-CM | POA: Diagnosis not present

## 2023-12-22 DIAGNOSIS — M9903 Segmental and somatic dysfunction of lumbar region: Secondary | ICD-10-CM | POA: Diagnosis not present

## 2023-12-24 DIAGNOSIS — M5417 Radiculopathy, lumbosacral region: Secondary | ICD-10-CM | POA: Diagnosis not present

## 2023-12-24 DIAGNOSIS — M5386 Other specified dorsopathies, lumbar region: Secondary | ICD-10-CM | POA: Diagnosis not present

## 2023-12-24 DIAGNOSIS — M9902 Segmental and somatic dysfunction of thoracic region: Secondary | ICD-10-CM | POA: Diagnosis not present

## 2023-12-24 DIAGNOSIS — M9903 Segmental and somatic dysfunction of lumbar region: Secondary | ICD-10-CM | POA: Diagnosis not present

## 2023-12-24 DIAGNOSIS — M9905 Segmental and somatic dysfunction of pelvic region: Secondary | ICD-10-CM | POA: Diagnosis not present

## 2023-12-30 DIAGNOSIS — M5386 Other specified dorsopathies, lumbar region: Secondary | ICD-10-CM | POA: Diagnosis not present

## 2023-12-30 DIAGNOSIS — M9903 Segmental and somatic dysfunction of lumbar region: Secondary | ICD-10-CM | POA: Diagnosis not present

## 2023-12-30 DIAGNOSIS — M5417 Radiculopathy, lumbosacral region: Secondary | ICD-10-CM | POA: Diagnosis not present

## 2023-12-30 DIAGNOSIS — G4733 Obstructive sleep apnea (adult) (pediatric): Secondary | ICD-10-CM | POA: Diagnosis not present

## 2023-12-30 DIAGNOSIS — M9905 Segmental and somatic dysfunction of pelvic region: Secondary | ICD-10-CM | POA: Diagnosis not present

## 2023-12-30 DIAGNOSIS — M9902 Segmental and somatic dysfunction of thoracic region: Secondary | ICD-10-CM | POA: Diagnosis not present

## 2024-01-01 DIAGNOSIS — M5386 Other specified dorsopathies, lumbar region: Secondary | ICD-10-CM | POA: Diagnosis not present

## 2024-01-01 DIAGNOSIS — M9902 Segmental and somatic dysfunction of thoracic region: Secondary | ICD-10-CM | POA: Diagnosis not present

## 2024-01-01 DIAGNOSIS — M5417 Radiculopathy, lumbosacral region: Secondary | ICD-10-CM | POA: Diagnosis not present

## 2024-01-01 DIAGNOSIS — M9905 Segmental and somatic dysfunction of pelvic region: Secondary | ICD-10-CM | POA: Diagnosis not present

## 2024-01-01 DIAGNOSIS — M9903 Segmental and somatic dysfunction of lumbar region: Secondary | ICD-10-CM | POA: Diagnosis not present

## 2024-01-13 DIAGNOSIS — Z961 Presence of intraocular lens: Secondary | ICD-10-CM | POA: Diagnosis not present

## 2024-01-13 DIAGNOSIS — H18051 Posterior corneal pigmentations, right eye: Secondary | ICD-10-CM | POA: Diagnosis not present

## 2024-01-13 DIAGNOSIS — H59811 Chorioretinal scars after surgery for detachment, right eye: Secondary | ICD-10-CM | POA: Diagnosis not present

## 2024-01-14 DIAGNOSIS — R55 Syncope and collapse: Secondary | ICD-10-CM | POA: Diagnosis not present

## 2024-01-14 DIAGNOSIS — I7121 Aneurysm of the ascending aorta, without rupture: Secondary | ICD-10-CM | POA: Diagnosis not present

## 2024-01-14 DIAGNOSIS — M5417 Radiculopathy, lumbosacral region: Secondary | ICD-10-CM | POA: Diagnosis not present

## 2024-01-14 DIAGNOSIS — M9903 Segmental and somatic dysfunction of lumbar region: Secondary | ICD-10-CM | POA: Diagnosis not present

## 2024-01-14 DIAGNOSIS — M5386 Other specified dorsopathies, lumbar region: Secondary | ICD-10-CM | POA: Diagnosis not present

## 2024-01-14 DIAGNOSIS — I10 Essential (primary) hypertension: Secondary | ICD-10-CM | POA: Diagnosis not present

## 2024-01-14 DIAGNOSIS — M9905 Segmental and somatic dysfunction of pelvic region: Secondary | ICD-10-CM | POA: Diagnosis not present

## 2024-01-14 DIAGNOSIS — M9902 Segmental and somatic dysfunction of thoracic region: Secondary | ICD-10-CM | POA: Diagnosis not present

## 2024-01-19 DIAGNOSIS — M9902 Segmental and somatic dysfunction of thoracic region: Secondary | ICD-10-CM | POA: Diagnosis not present

## 2024-01-19 DIAGNOSIS — M9905 Segmental and somatic dysfunction of pelvic region: Secondary | ICD-10-CM | POA: Diagnosis not present

## 2024-01-19 DIAGNOSIS — M5417 Radiculopathy, lumbosacral region: Secondary | ICD-10-CM | POA: Diagnosis not present

## 2024-01-19 DIAGNOSIS — M5386 Other specified dorsopathies, lumbar region: Secondary | ICD-10-CM | POA: Diagnosis not present

## 2024-01-19 DIAGNOSIS — M9903 Segmental and somatic dysfunction of lumbar region: Secondary | ICD-10-CM | POA: Diagnosis not present

## 2024-01-29 DIAGNOSIS — G4733 Obstructive sleep apnea (adult) (pediatric): Secondary | ICD-10-CM | POA: Diagnosis not present

## 2024-02-02 DIAGNOSIS — M9905 Segmental and somatic dysfunction of pelvic region: Secondary | ICD-10-CM | POA: Diagnosis not present

## 2024-02-02 DIAGNOSIS — M9902 Segmental and somatic dysfunction of thoracic region: Secondary | ICD-10-CM | POA: Diagnosis not present

## 2024-02-02 DIAGNOSIS — M9903 Segmental and somatic dysfunction of lumbar region: Secondary | ICD-10-CM | POA: Diagnosis not present

## 2024-02-02 DIAGNOSIS — M5386 Other specified dorsopathies, lumbar region: Secondary | ICD-10-CM | POA: Diagnosis not present

## 2024-02-02 DIAGNOSIS — M5417 Radiculopathy, lumbosacral region: Secondary | ICD-10-CM | POA: Diagnosis not present

## 2024-02-11 DIAGNOSIS — M9905 Segmental and somatic dysfunction of pelvic region: Secondary | ICD-10-CM | POA: Diagnosis not present

## 2024-02-11 DIAGNOSIS — M5386 Other specified dorsopathies, lumbar region: Secondary | ICD-10-CM | POA: Diagnosis not present

## 2024-02-11 DIAGNOSIS — M9902 Segmental and somatic dysfunction of thoracic region: Secondary | ICD-10-CM | POA: Diagnosis not present

## 2024-02-11 DIAGNOSIS — M5417 Radiculopathy, lumbosacral region: Secondary | ICD-10-CM | POA: Diagnosis not present

## 2024-02-11 DIAGNOSIS — M9903 Segmental and somatic dysfunction of lumbar region: Secondary | ICD-10-CM | POA: Diagnosis not present

## 2024-02-18 DIAGNOSIS — M5417 Radiculopathy, lumbosacral region: Secondary | ICD-10-CM | POA: Diagnosis not present

## 2024-02-18 DIAGNOSIS — M9902 Segmental and somatic dysfunction of thoracic region: Secondary | ICD-10-CM | POA: Diagnosis not present

## 2024-02-18 DIAGNOSIS — M9903 Segmental and somatic dysfunction of lumbar region: Secondary | ICD-10-CM | POA: Diagnosis not present

## 2024-02-18 DIAGNOSIS — M9905 Segmental and somatic dysfunction of pelvic region: Secondary | ICD-10-CM | POA: Diagnosis not present

## 2024-02-18 DIAGNOSIS — M5386 Other specified dorsopathies, lumbar region: Secondary | ICD-10-CM | POA: Diagnosis not present

## 2024-02-29 DIAGNOSIS — G4733 Obstructive sleep apnea (adult) (pediatric): Secondary | ICD-10-CM | POA: Diagnosis not present

## 2024-03-03 DIAGNOSIS — M9903 Segmental and somatic dysfunction of lumbar region: Secondary | ICD-10-CM | POA: Diagnosis not present

## 2024-03-03 DIAGNOSIS — M5417 Radiculopathy, lumbosacral region: Secondary | ICD-10-CM | POA: Diagnosis not present

## 2024-03-03 DIAGNOSIS — M5386 Other specified dorsopathies, lumbar region: Secondary | ICD-10-CM | POA: Diagnosis not present

## 2024-03-03 DIAGNOSIS — M9902 Segmental and somatic dysfunction of thoracic region: Secondary | ICD-10-CM | POA: Diagnosis not present

## 2024-03-03 DIAGNOSIS — M9905 Segmental and somatic dysfunction of pelvic region: Secondary | ICD-10-CM | POA: Diagnosis not present

## 2024-03-10 DIAGNOSIS — M5417 Radiculopathy, lumbosacral region: Secondary | ICD-10-CM | POA: Diagnosis not present

## 2024-03-10 DIAGNOSIS — M9905 Segmental and somatic dysfunction of pelvic region: Secondary | ICD-10-CM | POA: Diagnosis not present

## 2024-03-10 DIAGNOSIS — M5386 Other specified dorsopathies, lumbar region: Secondary | ICD-10-CM | POA: Diagnosis not present

## 2024-03-10 DIAGNOSIS — M9902 Segmental and somatic dysfunction of thoracic region: Secondary | ICD-10-CM | POA: Diagnosis not present

## 2024-03-10 DIAGNOSIS — M9903 Segmental and somatic dysfunction of lumbar region: Secondary | ICD-10-CM | POA: Diagnosis not present

## 2024-03-22 ENCOUNTER — Encounter: Payer: Self-pay | Admitting: Nurse Practitioner

## 2024-03-22 ENCOUNTER — Ambulatory Visit: Admitting: Nurse Practitioner

## 2024-03-22 VITALS — BP 118/61 | HR 80 | Temp 97.9°F | Ht 70.0 in | Wt 210.0 lb

## 2024-03-22 DIAGNOSIS — J0100 Acute maxillary sinusitis, unspecified: Secondary | ICD-10-CM | POA: Diagnosis not present

## 2024-03-22 DIAGNOSIS — H6122 Impacted cerumen, left ear: Secondary | ICD-10-CM | POA: Diagnosis not present

## 2024-03-22 MED ORDER — AZITHROMYCIN 250 MG PO TABS: ORAL_TABLET | ORAL | 0 refills | Status: AC
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
## 2024-03-30 DIAGNOSIS — G4733 Obstructive sleep apnea (adult) (pediatric): Secondary | ICD-10-CM | POA: Diagnosis not present

## 2024-03-31 DIAGNOSIS — G4733 Obstructive sleep apnea (adult) (pediatric): Secondary | ICD-10-CM | POA: Diagnosis not present

## 2024-03-31 DIAGNOSIS — I471 Supraventricular tachycardia, unspecified: Secondary | ICD-10-CM | POA: Diagnosis not present

## 2024-04-05 DIAGNOSIS — M5386 Other specified dorsopathies, lumbar region: Secondary | ICD-10-CM | POA: Diagnosis not present

## 2024-04-05 DIAGNOSIS — M9903 Segmental and somatic dysfunction of lumbar region: Secondary | ICD-10-CM | POA: Diagnosis not present

## 2024-04-05 DIAGNOSIS — M9905 Segmental and somatic dysfunction of pelvic region: Secondary | ICD-10-CM | POA: Diagnosis not present

## 2024-04-05 DIAGNOSIS — M9902 Segmental and somatic dysfunction of thoracic region: Secondary | ICD-10-CM | POA: Diagnosis not present

## 2024-04-05 DIAGNOSIS — M5417 Radiculopathy, lumbosacral region: Secondary | ICD-10-CM | POA: Diagnosis not present

## 2024-04-27 ENCOUNTER — Other Ambulatory Visit: Payer: Self-pay | Admitting: Family Medicine

## 2024-04-27 DIAGNOSIS — I10 Essential (primary) hypertension: Secondary | ICD-10-CM

## 2024-04-30 DIAGNOSIS — G4733 Obstructive sleep apnea (adult) (pediatric): Secondary | ICD-10-CM | POA: Diagnosis not present

## 2024-05-11 ENCOUNTER — Other Ambulatory Visit: Payer: Self-pay | Admitting: Family Medicine

## 2024-05-26 ENCOUNTER — Encounter: Payer: Self-pay | Admitting: Family Medicine

## 2024-05-26 ENCOUNTER — Ambulatory Visit: Admitting: Family Medicine

## 2024-05-26 VITALS — BP 134/78 | HR 80 | Temp 98.3°F | Ht 70.0 in | Wt 216.0 lb

## 2024-05-26 DIAGNOSIS — H1031 Unspecified acute conjunctivitis, right eye: Secondary | ICD-10-CM | POA: Diagnosis not present

## 2024-05-26 DIAGNOSIS — Z23 Encounter for immunization: Secondary | ICD-10-CM

## 2024-05-26 MED ORDER — TOBRAMYCIN-DEXAMETHASONE 0.3-0.1 % OP SUSP
OPHTHALMIC | 0 refills | Status: DC
Start: 2024-05-26 — End: 2024-06-22

## 2024-05-26 NOTE — Progress Notes (Signed)
 Chief Complaint  Patient presents with   Conjunctivitis    Right eye Itchy Watery redness    HPI  Patient presents today for: History of Present Illness Jeffrey [name] is a 68 year old male who presents with right eye redness and itchiness.  He has been experiencing redness and itchiness in his right eye for approximately two days. The sensation is described as 'scratchy at night' but there is no pain. His vision remains clear, and the left eye is unaffected. The redness and irritation are more pronounced in the morning.  He has a history of seasonal allergies, particularly in early spring due to red maple and red cedar, for which he takes allergy medication from January through April. However, he has not experienced allergies in the fall before, which raises his concern about the current symptoms.  No cold or runny nose recently. Vision is clear and the left eye is not affected. He has not been crying.     PMH: Smoking status noted Review of Systems  Objective: BP 134/78   Pulse 80   Temp 98.3 F (36.8 C)   Ht 5' 10 (1.778 m)   Wt 216 lb (98 kg)   SpO2 96%   BMI 30.99 kg/m  Gen: NAD, alert, cooperative with exam HEENT: NCAT, EOMI, PERRL CV: RRR, good S1/S2, no murmur Resp: CTABL, no wheezes, non-labored Ext: No edema, warm Neuro: Alert and oriented, No gross deficits  Acute bacterial conjunctivitis of right eye  Other orders -     Tobramycin -dexAMETHasone ; Apply 1 drop in affected eye(s) every 2 hours for two days. Then every 4 hours for 5 days.  Dispense: 5 mL; Refill: 0    Assessment and Plan Assessment & Plan Acute right eye conjunctivitis   Acute right eye conjunctivitis is likely infectious, presenting with redness, itchiness, and watery discharge in the right eye. The unilateral presentation and absence of recent cold or rhinorrhea suggest an infectious etiology over allergic conjunctivitis. Vision remains clear. Prescribe Tobradex  drops to be instilled  every 2 hours for 2 days, then 4 times daily for 5 days. Educate on adherence to the dosing schedule to prevent recurrence.  Recording duration: 8 minutes      Butler Der, MD

## 2024-05-31 DIAGNOSIS — G4733 Obstructive sleep apnea (adult) (pediatric): Secondary | ICD-10-CM | POA: Diagnosis not present

## 2024-06-22 ENCOUNTER — Ambulatory Visit (INDEPENDENT_AMBULATORY_CARE_PROVIDER_SITE_OTHER): Admitting: Family Medicine

## 2024-06-22 ENCOUNTER — Encounter: Payer: Self-pay | Admitting: Family Medicine

## 2024-06-22 VITALS — BP 135/80 | HR 66 | Temp 98.0°F | Ht 70.0 in | Wt 216.0 lb

## 2024-06-22 DIAGNOSIS — E782 Mixed hyperlipidemia: Secondary | ICD-10-CM

## 2024-06-22 DIAGNOSIS — N401 Enlarged prostate with lower urinary tract symptoms: Secondary | ICD-10-CM | POA: Diagnosis not present

## 2024-06-22 DIAGNOSIS — M1712 Unilateral primary osteoarthritis, left knee: Secondary | ICD-10-CM | POA: Diagnosis not present

## 2024-06-22 DIAGNOSIS — E559 Vitamin D deficiency, unspecified: Secondary | ICD-10-CM | POA: Diagnosis not present

## 2024-06-22 DIAGNOSIS — M19071 Primary osteoarthritis, right ankle and foot: Secondary | ICD-10-CM | POA: Diagnosis not present

## 2024-06-22 DIAGNOSIS — I10 Essential (primary) hypertension: Secondary | ICD-10-CM

## 2024-06-22 DIAGNOSIS — Z23 Encounter for immunization: Secondary | ICD-10-CM | POA: Diagnosis not present

## 2024-06-22 DIAGNOSIS — N529 Male erectile dysfunction, unspecified: Secondary | ICD-10-CM | POA: Diagnosis not present

## 2024-06-22 DIAGNOSIS — R351 Nocturia: Secondary | ICD-10-CM | POA: Diagnosis not present

## 2024-06-22 LAB — URINALYSIS
Bilirubin, UA: NEGATIVE
Glucose, UA: NEGATIVE
Ketones, UA: NEGATIVE
Leukocytes,UA: NEGATIVE
Nitrite, UA: NEGATIVE
Protein,UA: NEGATIVE
RBC, UA: NEGATIVE
Specific Gravity, UA: 1.015 (ref 1.005–1.030)
Urobilinogen, Ur: 0.2 mg/dL (ref 0.2–1.0)
pH, UA: 7 (ref 5.0–7.5)

## 2024-06-22 LAB — LIPID PANEL
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
## 2024-06-22 NOTE — Progress Notes (Deleted)
 Complete physical exam  Patient: Jeffrey [name]   DOB: [birth year]   68 y.o. Male  MRN: [id number]  Subjective:    Chief Complaint  Patient presents with  . Annual Exam    Jeffrey [name] is a 68 y.o. male who presents today for a complete physical exam. He reports consuming a {diet types:17450} diet. {types:19826} He generally feels {DESC; WELL/FAIRLY WELL/POORLY:18703}. He reports sleeping {DESC; WELL/FAIRLY WELL/POORLY:18703}. He {does/does not:200015} have additional problems to discuss today.    Most recent fall risk assessment:    06/22/2024   10:15 AM  Fall Risk   Falls in the past year? 0  Number falls in past yr: 0  Injury with Fall? 0  Risk for fall due to : No Fall Risks  Follow up Falls evaluation completed     Most recent depression screenings:    06/22/2024   10:16 AM 05/26/2024    1:06 PM  PHQ 2/9 Scores  PHQ - 2 Score 0 0  PHQ- 9 Score 0     {VISON DENTAL STD PSA (Optional):27386}  {History (Optional):23778}  Patient Care Team: Zollie Lowers, MD as PCP - General (Family Medicine)   Outpatient Medications Prior to Visit  Medication Sig  . acetaminophen  (TYLENOL ) 650 MG CR tablet Take 650 mg by mouth every 8 (eight) hours as needed for pain.  . amLODipine  (NORVASC ) 5 MG tablet TAKE 1 TABLET (5 MG TOTAL) BY MOUTH DAILY.  . cyclobenzaprine  (FLEXERIL ) 10 MG tablet Take 1 tablet (10 mg total) by mouth 3 (three) times daily as needed for muscle spasms.  . diclofenac Sodium (VOLTAREN) 1 % GEL Apply 2 g topically 2 (two) times daily as needed (pain).   . esomeprazole  (NEXIUM ) 40 MG capsule TAKE 1 CAPSULE (40 MG TOTAL) BY MOUTH DAILY.  . fluticasone  (FLONASE ) 50 MCG/ACT nasal spray SPRAY 2 SPRAYS INTO EACH NOSTRIL EVERY DAY  . meloxicam  (MOBIC ) 15 MG tablet TAKE 1 TABLET (15 MG TOTAL) BY MOUTH DAILY.  SABRA polyethylene glycol powder (GLYCOLAX /MIRALAX ) 17 GM/SCOOP powder Take 17 g by mouth 2 (two) times daily as needed for moderate constipation.  . Psyllium 30.9 % POWD  Drink 1 tablespoon dissolved in water , Daily  . tadalafil  (CIALIS ) 5 MG tablet Take 1 tablet (5 mg total) by mouth daily.  . Vitamin D , Ergocalciferol , (DRISDOL ) 1.25 MG (50000 UNIT) CAPS capsule TAKE 1 CAPSULE BY MOUTH ONE TIME PER WEEK  . [DISCONTINUED] sildenafil  (VIAGRA ) 100 MG tablet Take 0.5-1 tablets (50-100 mg total) by mouth daily as needed for erectile dysfunction (sex).  . [DISCONTINUED] tobramycin -dexamethasone  (TOBRADEX ) ophthalmic solution Apply 1 drop in affected eye(s) every 2 hours for two days. Then every 4 hours for 5 days.   No facility-administered medications prior to visit.    ROS        Objective:     BP 135/80   Pulse 66   Temp 98 F (36.7 C)   Ht 5' 10 (1.778 m)   Wt 216 lb (98 kg)   SpO2 98%   BMI 30.99 kg/m  {Vitals History (Optional):23777}  Physical Exam   No results found for any visits on 06/22/24. {Show previous labs (optional):23779}    Assessment & Plan:    Routine Health Maintenance and Physical Exam  Immunization History  Administered Date(s) Administered  . Fluad Quad(high Dose 65+) 08/27/2021  . Fluad Trivalent(High Dose 65+) 07/16/2023  . INFLUENZA, HIGH DOSE SEASONAL PF 05/26/2024  . Influenza,inj,Quad PF,6+ Mos 08/06/2018, 05/30/2019, 06/09/2020  . Influenza,inj,quad,  With Preservative 05/30/2019  . Influenza-Unspecified 06/20/2016, 08/15/2017  . Rabies, IM 02/15/2013, 02/15/2013, 02/15/2013, 02/18/2013, 02/18/2013, 02/22/2013, 02/22/2013, 03/01/2013, 03/01/2013  . Rabies, intradermal 02/15/2013  . Tdap 02/15/2013  . Zoster Recombinant(Shingrix) 05/30/2019, 09/06/2019  . Zoster, Unspecified 05/30/2019, 09/06/2019    Health Maintenance  Topic Date Due  . Medicare Annual Wellness (AWV)  Never done  . Pneumococcal Vaccine: 50+ Years (1 of 1 - PCV) Never done  . COVID-19 Vaccine (1) 07/08/2024 (Originally 11/24/1960)  . DTaP/Tdap/Td (2 - Td or Tdap) 11/05/2024 (Originally 02/16/2023)  . Colonoscopy  09/21/2029  .  Influenza Vaccine  Completed  . Hepatitis C Screening  Completed  . Zoster Vaccines- Shingrix  Completed  . HPV VACCINES  Aged Out  . Meningococcal B Vaccine  Aged Out    Discussed health benefits of physical activity, and encouraged him to engage in regular exercise appropriate for his age and condition.  Problem List Items Addressed This Visit       Active Problems   Arthritis of right ankle   Relevant Orders   CBC with Differential/Platelet   CMP14+EGFR   Hypertension - Primary   Relevant Orders   CBC with Differential/Platelet   CMP14+EGFR   PSA, total and free   Lipid panel   Urinalysis   VITAMIN D  25 Hydroxy (Vit-D Deficiency, Fractures)   Osteoarthritis of left knee   Relevant Orders   CBC with Differential/Platelet   CMP14+EGFR   Vasculogenic erectile dysfunction   Relevant Orders   CBC with Differential/Platelet   CMP14+EGFR   Other Visit Diagnoses       Benign prostatic hyperplasia with nocturia       Relevant Orders   CBC with Differential/Platelet   CMP14+EGFR   PSA, total and free   Urinalysis     Vitamin D  deficiency       Relevant Orders   CBC with Differential/Platelet   CMP14+EGFR   VITAMIN D  25 Hydroxy (Vit-D Deficiency, Fractures)     Mixed hyperlipidemia       Relevant Orders   Lipid panel      No follow-ups on file.     Butler Der, MD

## 2024-06-23 LAB — CMP14+EGFR
ALT: 24 IU/L (ref 0–44)
AST: 18 IU/L (ref 0–40)
Albumin: 4.5 g/dL (ref 3.9–4.9)
Alkaline Phosphatase: 71 IU/L (ref 47–123)
BUN/Creatinine Ratio: 16 (ref 10–24)
BUN: 17 mg/dL (ref 8–27)
Bilirubin Total: 0.7 mg/dL (ref 0.0–1.2)
CO2: 24 mmol/L (ref 20–29)
Calcium: 9.6 mg/dL (ref 8.6–10.2)
Chloride: 102 mmol/L (ref 96–106)
Creatinine, Ser: 1.09 mg/dL (ref 0.76–1.27)
Globulin, Total: 2.1 g/dL (ref 1.5–4.5)
Glucose: 105 mg/dL — AB (ref 70–99)
Potassium: 4.4 mmol/L (ref 3.5–5.2)
Sodium: 139 mmol/L (ref 134–144)
Total Protein: 6.6 g/dL (ref 6.0–8.5)
eGFR: 74 mL/min/1.73 (ref >59)

## 2024-06-23 LAB — CBC WITH DIFFERENTIAL/PLATELET
Basophils Absolute: 0.1 x10E3/uL (ref 0.0–0.2)
Basos: 1 %
EOS (ABSOLUTE): 0.2 x10E3/uL (ref 0.0–0.4)
Eos: 3 %
Hematocrit: 47.2 % (ref 37.5–51.0)
Hemoglobin: 15.7 g/dL (ref 13.0–17.7)
Immature Grans (Abs): 0 x10E3/uL (ref 0.0–0.1)
Immature Granulocytes: 0 %
Lymphocytes Absolute: 1.6 x10E3/uL (ref 0.7–3.1)
Lymphs: 22 %
MCH: 29.7 pg (ref 26.6–33.0)
MCHC: 33.3 g/dL (ref 31.5–35.7)
MCV: 89 fL (ref 79–97)
Monocytes Absolute: 0.6 x10E3/uL (ref 0.1–0.9)
Monocytes: 8 %
Neutrophils Absolute: 4.7 x10E3/uL (ref 1.4–7.0)
Neutrophils: 66 %
Platelets: 265 x10E3/uL (ref 150–450)
RBC: 5.29 x10E6/uL (ref 4.14–5.80)
RDW: 12.5 % (ref 11.6–15.4)
WBC: 7.2 x10E3/uL (ref 3.4–10.8)

## 2024-06-23 LAB — LIPID PANEL
Cholesterol, Total: 168 mg/dL (ref 100–199)
HDL: 43 mg/dL (ref >39)
LDL CALC COMMENT:: 3.9 ratio (ref 0.0–5.0)
LDL Chol Calc (NIH): 109 mg/dL — AB (ref 0–99)
Triglycerides: 85 mg/dL (ref 0–149)
VLDL Cholesterol Cal: 16 mg/dL (ref 5–40)

## 2024-06-23 LAB — PSA, TOTAL AND FREE
PSA, Free Pct: 37.9 %
PSA, Free: 0.53 ng/mL
Prostate Specific Ag, Serum: 1.4 ng/mL (ref 0.0–4.0)

## 2024-06-23 LAB — VITAMIN D 25 HYDROXY (VIT D DEFICIENCY, FRACTURES): Vit D, 25-Hydroxy: 43.7 ng/mL (ref 30.0–100.0)

## 2024-06-27 ENCOUNTER — Ambulatory Visit: Payer: Self-pay | Admitting: Family Medicine

## 2024-06-27 NOTE — Progress Notes (Signed)
Hello Jeffrey [name],  Your lab result is normal and/or stable.Some minor variations that are not significant are commonly marked abnormal, but do not represent any medical problem for you.  Best regards, Cleo Villamizar, M.D.

## 2024-07-07 ENCOUNTER — Other Ambulatory Visit: Payer: Self-pay | Admitting: Family Medicine

## 2024-07-07 DIAGNOSIS — J0140 Acute pansinusitis, unspecified: Secondary | ICD-10-CM

## 2024-07-14 ENCOUNTER — Ambulatory Visit: Payer: Self-pay | Admitting: Family Medicine

## 2024-07-14 DIAGNOSIS — S61041A Puncture wound with foreign body of right thumb without damage to nail, initial encounter: Secondary | ICD-10-CM | POA: Diagnosis not present

## 2024-07-14 NOTE — Telephone Encounter (Signed)
 Triage has already taken care of this.

## 2024-07-14 NOTE — Telephone Encounter (Signed)
 FYI Only or Action Required?: FYI only for provider.  Patient was last seen in primary care on 06/22/2024 by Zollie Lowers, MD.  Called Nurse Triage reporting Foreign Body in Skin.  Symptoms began today.  Triage Disposition: See HCP Within 4 Hours (Or PCP Triage)  Patient/caregiver understands and will follow disposition?: Unsure           Copied from CRM #[id number]. Topic: Clinical - Red Word Triage >> Jul 14, 2024  4:03 PM Jeffrey [name] wrote: Red Word that prompted transfer to Nurse Triage: Splinter through pt's thumb, bleeding a lil. in pain Reason for Disposition  [1] Caller can't get FB out AND [2] it's causing pain  Answer Assessment - Initial Assessment Questions When this RN returned to the line, pt had disconnected the call. This RN made attempt to call the pt back; no answer. A voicemail was left with call back number provided.  This RN recommends the patient go to urgent care today as there are no available appointments in the office. Pt states he is close to [facility] and would like to be seen there. This RN placed pt on hold and contacted [facility] to check for availability. Clinical staff confirmed that no providers are available to see the patient and advised that he go to urgent care.  Reason for call: Right thumb splinter-penetrating through thumb Occurred approximately 10 minutes ago Pt attempted removal using pliers No active bleeding at this time Some numbness  Pain: Present with thumb movement Tetanus: Las booster two years ago  Protocols used: Skin Foreign Body-A-AH

## 2024-07-24 ENCOUNTER — Other Ambulatory Visit: Payer: Self-pay | Admitting: *Deleted

## 2024-07-24 DIAGNOSIS — I10 Essential (primary) hypertension: Secondary | ICD-10-CM

## 2024-07-29 DIAGNOSIS — Z0100 Encounter for examination of eyes and vision without abnormal findings: Secondary | ICD-10-CM | POA: Diagnosis not present

## 2024-09-17 NOTE — Progress Notes (Signed)
 Jeffrey [name]                                          MRN: [id number]   09/17/2024   The [facility] Quality Team Specialist reviewed this patient medical record for the purposes of chart review for care gap closure. The following were reviewed: chart review for care gap closure-controlling blood pressure.    [facility] Quality Team

## 2024-09-22 ENCOUNTER — Ambulatory Visit (INDEPENDENT_AMBULATORY_CARE_PROVIDER_SITE_OTHER): Admitting: Family Medicine

## 2024-09-22 ENCOUNTER — Encounter: Payer: Self-pay | Admitting: Family Medicine

## 2024-09-22 VITALS — BP 143/88 | HR 74 | Temp 98.2°F | Ht 70.0 in | Wt 221.0 lb

## 2024-09-22 DIAGNOSIS — J069 Acute upper respiratory infection, unspecified: Secondary | ICD-10-CM | POA: Diagnosis not present

## 2024-09-22 LAB — RAPID STREP SCREEN (MED CTR MEBANE ONLY): Strep Gp A Ag, IA W/Reflex: NEGATIVE

## 2024-09-22 LAB — VERITOR SARS-COV-2 AND FLU A+B
BD Veritor SARS-CoV-2 Ag: NEGATIVE
Influenza A: NEGATIVE
Influenza B: NEGATIVE

## 2024-09-22 LAB — CULTURE, GROUP A STREP

## 2024-09-22 MED ORDER — PREDNISONE 10 MG (21) PO TBPK: ORAL_TABLET | ORAL | 0 refills | Status: AC

## 2024-09-22 MED ORDER — DESLORATADINE 5 MG PO TABS: 5.0000 mg | ORAL_TABLET | Freq: Every day | ORAL | 99 refills | Status: AC | PRN

## 2024-09-22 MED ORDER — BENZONATATE 200 MG PO CAPS: 200.0000 mg | ORAL_CAPSULE | Freq: Two times a day (BID) | ORAL | 0 refills | Status: AC | PRN

## 2024-09-22 NOTE — Patient Instructions (Signed)
 Flu, COVID and strep negative. It appears that you have a viral upper respiratory infection (cold).  Cold symptoms can last up to 2 weeks.  I recommend that you only use cold medications that are safe in high blood pressure like Coricidin (generic is fine).  Other cold medications can increase your blood pressure.    - Get plenty of rest and drink plenty of fluids. - Try to breathe moist air. Use a cold mist humidifier. - Consume warm fluids (soup or tea) to provide relief for a stuffy nose and to loosen phlegm. - For nasal stuffiness, try saline nasal spray or a Neti Pot.  Afrin nasal spray can also be used but this product should not be used longer than 3 days or it will cause rebound nasal stuffiness (worsening nasal congestion). - For sore throat pain relief: use chloraseptic spray, suck on throat lozenges, hard candy or popsicles; gargle with warm salt water  (1/4 tsp. salt per 8 oz. of water ); and eat soft, bland foods. - Eat a well-balanced diet. If you cannot, ensure you are getting enough nutrients by taking a daily multivitamin. - Avoid dairy products, as they can thicken phlegm. - Avoid alcohol, as it impairs your bodys immune system.  CONTACT YOUR DOCTOR IF YOU EXPERIENCE ANY OF THE FOLLOWING: - High fever - Ear pain - Sinus-type headache - Unusually severe cold symptoms - Cough that gets worse while other cold symptoms improve - Flare up of any chronic lung problem, such as asthma - Your symptoms persist longer than 2 weeks
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
## 2024-09-27 ENCOUNTER — Telehealth: Payer: Self-pay | Admitting: *Deleted

## 2024-09-27 ENCOUNTER — Other Ambulatory Visit: Payer: Self-pay | Admitting: Family Medicine

## 2024-09-27 MED ORDER — AMOXICILLIN 500 MG PO CAPS: 500.0000 mg | ORAL_CAPSULE | Freq: Two times a day (BID) | ORAL | 0 refills | Status: DC

## 2024-09-27 NOTE — Telephone Encounter (Signed)
Sent in amoxicillin

## 2024-09-27 NOTE — Progress Notes (Signed)
 Informed via VM. LS

## 2024-09-27 NOTE — Telephone Encounter (Unsigned)
 Copied from CRM [id number]. Topic: Clinical - Medical Advice >> Sep 27, 2024  8:45 AM Diannia H wrote: Reason for CRM: Patient is calling because he was seen on 12/31 and he states that he is only getting worse. He has sinus drainage a lot at night and he's coughing and blowing out a lot of mucus. He is either wanting to be seen or an antibiotic and a cough medicine sent to the pharmacy. Could you assist? Patients callback number is [phone].  [facility] [phone] - [place], [place] - [address] [place] [address] Phone: [phone] Fax: [phone] Hours: Not open 24 hours

## 2024-09-27 NOTE — Progress Notes (Unsigned)
Sent in amoxicillin for him 

## 2024-09-28 ENCOUNTER — Telehealth: Payer: Self-pay | Admitting: Family Medicine

## 2024-09-28 DIAGNOSIS — B9689 Other specified bacterial agents as the cause of diseases classified elsewhere: Secondary | ICD-10-CM

## 2024-09-28 MED ORDER — AMOXICILLIN-POT CLAVULANATE 875-125 MG PO TABS: 1.0000 | ORAL_TABLET | Freq: Two times a day (BID) | ORAL | 0 refills | Status: AC

## 2024-09-28 NOTE — Addendum Note (Signed)
 Addended by: JOLINDA NORENE HERO on: 09/28/2024 09:06 AM   Modules accepted: Orders

## 2024-09-28 NOTE — Telephone Encounter (Signed)
 It actually looks like this was already addressed by Dr D yesterday but abx sent to the wrong pharmacy. R[facility]out[facility] to [facility]  Meds ordered this encounter  Medications   amoxicillin -clavulanate (AUGMENTIN ) 875-125 MG tablet    Sig: Take 1 tablet by mouth 2 (two) times daily.    Dispense:  20 tablet    Refill:  0

## 2024-09-28 NOTE — Telephone Encounter (Signed)
 Called and spoke with patient and made him aware that medicine was rerouted to correct pharmacy ([facility]).

## 2024-09-28 NOTE — Telephone Encounter (Signed)
 Copied from CRM [id number]. Topic: Clinical - Medical Advice >> Sep 27, 2024  8:45 AM Diannia H wrote: Reason for CRM: Patient is calling because he was seen on 12/31 and he states that he is only getting worse. He has sinus drainage a lot at night and he's coughing and blowing out a lot of mucus. He is either wanting to be seen or an antibiotic and a cough medicine sent to the pharmacy. Could you assist? Patients callback number is [phone].  [id number] [place], [place] - [address] [place] [address] Phone: [phone] Fax: [phone] Hours: Not open 24 hours >> Sep 27, 2024  5:56 PM Terri G wrote: Patient medication amoxicillin  [facility] NOT to [facility].

## 2024-10-20 NOTE — Progress Notes (Signed)
 Jeffrey [name]                                          MRN: [id number]   10/20/2024   The [facility] Quality Team Specialist reviewed this patient medical record for the purposes of chart review for care gap closure. The following were reviewed: chart review for care gap closure-controlling blood pressure.    [facility] Quality Team

## 2024-10-22 ENCOUNTER — Other Ambulatory Visit: Payer: Self-pay | Admitting: Family Medicine

## 2025-06-27 ENCOUNTER — Encounter: Payer: Self-pay | Admitting: Family Medicine
